# Patient Record
Sex: Female | Born: 1960 | Race: Black or African American | Hispanic: No | Smoking: Former smoker
Health system: Southern US, Community
[De-identification: ages and names within clinical notes are randomized; demographics above are authoritative.]

## PROBLEM LIST (undated history)

## (undated) DIAGNOSIS — E039 Hypothyroidism, unspecified: Secondary | ICD-10-CM

## (undated) DIAGNOSIS — E785 Hyperlipidemia, unspecified: Secondary | ICD-10-CM

## (undated) DIAGNOSIS — I10 Essential (primary) hypertension: Secondary | ICD-10-CM

## (undated) DIAGNOSIS — F17200 Nicotine dependence, unspecified, uncomplicated: Secondary | ICD-10-CM

## (undated) DIAGNOSIS — Z87442 Personal history of urinary calculi: Secondary | ICD-10-CM

## (undated) DIAGNOSIS — M1990 Unspecified osteoarthritis, unspecified site: Secondary | ICD-10-CM

## (undated) DIAGNOSIS — E119 Type 2 diabetes mellitus without complications: Secondary | ICD-10-CM

## (undated) HISTORY — DX: Type 2 diabetes mellitus without complications: E11.9

## (undated) HISTORY — PX: OTHER SURGICAL HISTORY: SHX169

## (undated) HISTORY — DX: Nicotine dependence, unspecified, uncomplicated: F17.200

## (undated) HISTORY — DX: Hyperlipidemia, unspecified: E78.5

## (undated) HISTORY — DX: Essential (primary) hypertension: I10

## (undated) HISTORY — PX: TRIGGER FINGER RELEASE: SHX641

## (undated) HISTORY — DX: Hypothyroidism, unspecified: E03.9

---
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
## 1999-03-02 ENCOUNTER — Encounter: Payer: Self-pay | Admitting: Internal Medicine

## 1999-03-02 ENCOUNTER — Encounter: Admission: RE | Admit: 1999-03-02 | Discharge: 1999-03-02 | Payer: Self-pay | Admitting: Internal Medicine

## 1999-03-14 ENCOUNTER — Encounter: Admission: RE | Admit: 1999-03-14 | Discharge: 1999-03-14 | Payer: Self-pay | Admitting: Urology

## 1999-10-12 ENCOUNTER — Encounter: Admission: RE | Admit: 1999-10-12 | Discharge: 1999-10-12 | Payer: Self-pay | Admitting: Cardiology

## 1999-10-12 ENCOUNTER — Encounter: Payer: Self-pay | Admitting: Cardiology

## 1999-11-02 ENCOUNTER — Other Ambulatory Visit: Admission: RE | Admit: 1999-11-02 | Discharge: 1999-11-02 | Payer: Self-pay | Admitting: Internal Medicine

## 2000-07-04 ENCOUNTER — Encounter: Admission: RE | Admit: 2000-07-04 | Discharge: 2000-07-04 | Payer: Self-pay | Admitting: Internal Medicine

## 2000-07-04 ENCOUNTER — Encounter: Payer: Self-pay | Admitting: Internal Medicine

## 2003-11-24 ENCOUNTER — Encounter: Admission: RE | Admit: 2003-11-24 | Discharge: 2003-12-07 | Payer: Self-pay | Admitting: Internal Medicine

## 2004-02-24 ENCOUNTER — Ambulatory Visit: Payer: Self-pay | Admitting: Internal Medicine

## 2004-06-22 ENCOUNTER — Ambulatory Visit: Payer: Self-pay | Admitting: Internal Medicine

## 2004-06-29 ENCOUNTER — Other Ambulatory Visit: Admission: RE | Admit: 2004-06-29 | Discharge: 2004-06-29 | Payer: Self-pay | Admitting: Internal Medicine

## 2004-06-29 ENCOUNTER — Ambulatory Visit: Payer: Self-pay | Admitting: Internal Medicine

## 2004-10-24 ENCOUNTER — Ambulatory Visit: Payer: Self-pay | Admitting: Internal Medicine

## 2004-10-27 ENCOUNTER — Ambulatory Visit: Payer: Self-pay | Admitting: Internal Medicine

## 2004-10-31 ENCOUNTER — Ambulatory Visit: Payer: Self-pay | Admitting: Internal Medicine

## 2005-01-30 ENCOUNTER — Ambulatory Visit: Payer: Self-pay | Admitting: Internal Medicine

## 2005-04-26 ENCOUNTER — Ambulatory Visit: Payer: Self-pay | Admitting: Internal Medicine

## 2005-05-03 ENCOUNTER — Ambulatory Visit: Payer: Self-pay | Admitting: Internal Medicine

## 2005-08-07 ENCOUNTER — Encounter: Admission: RE | Admit: 2005-08-07 | Discharge: 2005-08-07 | Payer: Self-pay | Admitting: Internal Medicine

## 2005-08-23 ENCOUNTER — Encounter: Payer: Self-pay | Admitting: Internal Medicine

## 2005-10-24 ENCOUNTER — Ambulatory Visit: Payer: Self-pay | Admitting: Internal Medicine

## 2005-11-06 ENCOUNTER — Encounter (INDEPENDENT_AMBULATORY_CARE_PROVIDER_SITE_OTHER): Payer: Self-pay | Admitting: *Deleted

## 2005-11-06 ENCOUNTER — Other Ambulatory Visit: Admission: RE | Admit: 2005-11-06 | Discharge: 2005-11-06 | Payer: Self-pay | Admitting: Internal Medicine

## 2005-11-06 ENCOUNTER — Ambulatory Visit: Payer: Self-pay | Admitting: Internal Medicine

## 2006-01-01 ENCOUNTER — Ambulatory Visit: Payer: Self-pay | Admitting: Internal Medicine

## 2006-01-08 ENCOUNTER — Ambulatory Visit: Payer: Self-pay | Admitting: Internal Medicine

## 2006-07-02 ENCOUNTER — Ambulatory Visit: Payer: Self-pay | Admitting: Internal Medicine

## 2006-07-02 LAB — CONVERTED CEMR LAB
ALT: 15 units/L (ref 0–40)
AST: 15 units/L (ref 0–37)
Albumin: 3.2 g/dL — ABNORMAL LOW (ref 3.5–5.2)
Alkaline Phosphatase: 71 units/L (ref 39–117)
BUN: 9 mg/dL (ref 6–23)
Bilirubin, Direct: 0.1 mg/dL (ref 0.0–0.3)
CO2: 29 meq/L (ref 19–32)
Calcium: 8.9 mg/dL (ref 8.4–10.5)
Chloride: 107 meq/L (ref 96–112)
Cholesterol: 170 mg/dL (ref 0–200)
Creatinine, Ser: 0.6 mg/dL (ref 0.4–1.2)
GFR calc Af Amer: 138 mL/min
GFR calc non Af Amer: 114 mL/min
Glucose, Bld: 97 mg/dL (ref 70–99)
HDL: 54 mg/dL (ref >39.0)
Hgb A1c MFr Bld: 6.5 % — ABNORMAL HIGH (ref 4.6–6.0)
LDL Cholesterol: 101 mg/dL — ABNORMAL HIGH (ref 0–99)
Potassium: 4.2 meq/L (ref 3.5–5.1)
Sodium: 142 meq/L (ref 135–145)
Total Bilirubin: 0.5 mg/dL (ref 0.3–1.2)
Total CHOL/HDL Ratio: 3.1
Total Protein: 7.3 g/dL (ref 6.0–8.3)
Triglycerides: 73 mg/dL (ref 0–149)
VLDL: 15 mg/dL (ref 0–40)

## 2006-07-09 ENCOUNTER — Ambulatory Visit: Payer: Self-pay | Admitting: Internal Medicine

## 2006-08-13 ENCOUNTER — Encounter: Admission: RE | Admit: 2006-08-13 | Discharge: 2006-08-13 | Payer: Self-pay | Admitting: Internal Medicine

## 2006-11-08 ENCOUNTER — Ambulatory Visit: Payer: Self-pay | Admitting: Internal Medicine

## 2006-11-08 LAB — CONVERTED CEMR LAB
ALT: 15 units/L (ref 0–35)
AST: 15 units/L (ref 0–37)
Albumin: 3.5 g/dL (ref 3.5–5.2)
Alkaline Phosphatase: 79 units/L (ref 39–117)
BUN: 8 mg/dL (ref 6–23)
Basophils Absolute: 0.1 10*3/uL (ref 0.0–0.1)
Basophils Relative: 0.7 % (ref 0.0–1.0)
Bilirubin, Direct: 0.1 mg/dL (ref 0.0–0.3)
CO2: 28 meq/L (ref 19–32)
Calcium: 9.2 mg/dL (ref 8.4–10.5)
Chloride: 106 meq/L (ref 96–112)
Cholesterol: 180 mg/dL (ref 0–200)
Creatinine, Ser: 0.6 mg/dL (ref 0.4–1.2)
Creatinine,U: 200.6 mg/dL
Eosinophils Absolute: 0.2 10*3/uL (ref 0.0–0.6)
Eosinophils Relative: 2.2 % (ref 0.0–5.0)
GFR calc Af Amer: 137 mL/min
GFR calc non Af Amer: 113 mL/min
Glucose, Bld: 108 mg/dL — ABNORMAL HIGH (ref 70–99)
HCT: 39.7 % (ref 36.0–46.0)
HDL: 59.7 mg/dL (ref >39.0)
Hemoglobin: 13.9 g/dL (ref 12.0–15.0)
Hgb A1c MFr Bld: 6.8 % — ABNORMAL HIGH (ref 4.6–6.0)
LDL Cholesterol: 106 mg/dL — ABNORMAL HIGH (ref 0–99)
Lymphocytes Relative: 27 % (ref 12.0–46.0)
MCHC: 35.1 g/dL (ref 30.0–36.0)
MCV: 87.3 fL (ref 78.0–100.0)
Microalb Creat Ratio: 5 mg/g (ref 0.0–30.0)
Microalb, Ur: 1 mg/dL (ref 0.0–1.9)
Monocytes Absolute: 0.6 10*3/uL (ref 0.2–0.7)
Monocytes Relative: 7.2 % (ref 3.0–11.0)
Neutro Abs: 5.7 10*3/uL (ref 1.4–7.7)
Neutrophils Relative %: 62.9 % (ref 43.0–77.0)
Platelets: 239 10*3/uL (ref 150–400)
Potassium: 4 meq/L (ref 3.5–5.1)
RBC: 4.55 M/uL (ref 3.87–5.11)
RDW: 12.5 % (ref 11.5–14.6)
Sodium: 139 meq/L (ref 135–145)
TSH: 0.72 microintl units/mL (ref 0.35–5.50)
Total Bilirubin: 0.8 mg/dL (ref 0.3–1.2)
Total CHOL/HDL Ratio: 3
Total Protein: 7.6 g/dL (ref 6.0–8.3)
Triglycerides: 72 mg/dL (ref 0–149)
VLDL: 14 mg/dL (ref 0–40)
WBC: 9 10*3/uL (ref 4.5–10.5)

## 2006-11-13 DIAGNOSIS — E039 Hypothyroidism, unspecified: Secondary | ICD-10-CM | POA: Insufficient documentation

## 2006-11-13 DIAGNOSIS — E119 Type 2 diabetes mellitus without complications: Secondary | ICD-10-CM

## 2006-11-13 DIAGNOSIS — E1165 Type 2 diabetes mellitus with hyperglycemia: Secondary | ICD-10-CM | POA: Insufficient documentation

## 2006-11-13 DIAGNOSIS — E785 Hyperlipidemia, unspecified: Secondary | ICD-10-CM

## 2006-11-13 HISTORY — DX: Hypothyroidism, unspecified: E03.9

## 2006-11-13 HISTORY — DX: Type 2 diabetes mellitus without complications: E11.9

## 2006-11-13 HISTORY — DX: Hyperlipidemia, unspecified: E78.5

## 2006-12-19 ENCOUNTER — Ambulatory Visit: Payer: Self-pay | Admitting: Internal Medicine

## 2007-05-09 ENCOUNTER — Ambulatory Visit: Payer: Self-pay | Admitting: Internal Medicine

## 2007-05-09 LAB — CONVERTED CEMR LAB
ALT: 20 units/L (ref 0–35)
AST: 15 units/L (ref 0–37)
Albumin: 3.4 g/dL — ABNORMAL LOW (ref 3.5–5.2)
Alkaline Phosphatase: 89 units/L (ref 39–117)
BUN: 9 mg/dL (ref 6–23)
Calcium: 9.2 mg/dL (ref 8.4–10.5)
Chloride: 104 meq/L (ref 96–112)
Creatinine, Ser: 0.7 mg/dL (ref 0.4–1.2)
Creatinine,U: 169.2 mg/dL
GFR calc non Af Amer: 95 mL/min
Hgb A1c MFr Bld: 7.5 % — ABNORMAL HIGH (ref 4.6–6.0)
LDL Cholesterol: 107 mg/dL — ABNORMAL HIGH (ref 0–99)
Sodium: 139 meq/L (ref 135–145)
VLDL: 15 mg/dL (ref 0–40)

## 2007-05-16 ENCOUNTER — Ambulatory Visit: Payer: Self-pay | Admitting: Internal Medicine

## 2007-05-16 LAB — HM DIABETES FOOT EXAM

## 2007-05-19 DIAGNOSIS — I10 Essential (primary) hypertension: Secondary | ICD-10-CM | POA: Insufficient documentation

## 2007-05-19 HISTORY — DX: Essential (primary) hypertension: I10

## 2007-08-08 ENCOUNTER — Ambulatory Visit: Payer: Self-pay | Admitting: Internal Medicine

## 2007-08-08 LAB — CONVERTED CEMR LAB
AST: 15 units/L (ref 0–37)
Albumin: 3.5 g/dL (ref 3.5–5.2)
Alkaline Phosphatase: 75 units/L (ref 39–117)
BUN: 7 mg/dL (ref 6–23)
Cholesterol: 131 mg/dL (ref 0–200)
Creatinine, Ser: 0.6 mg/dL (ref 0.4–1.2)
GFR calc Af Amer: 137 mL/min
Glucose, Bld: 93 mg/dL (ref 70–99)
Potassium: 3.9 meq/L (ref 3.5–5.1)
Total Protein: 7.3 g/dL (ref 6.0–8.3)
Triglycerides: 43 mg/dL (ref 0–149)
VLDL: 9 mg/dL (ref 0–40)

## 2007-08-14 ENCOUNTER — Encounter: Admission: RE | Admit: 2007-08-14 | Discharge: 2007-08-14 | Payer: Self-pay | Admitting: Cardiology

## 2007-08-15 ENCOUNTER — Ambulatory Visit: Payer: Self-pay | Admitting: Internal Medicine

## 2007-12-05 ENCOUNTER — Ambulatory Visit: Payer: Self-pay | Admitting: Internal Medicine

## 2007-12-05 LAB — CONVERTED CEMR LAB
ALT: 14 units/L (ref 0–35)
Albumin: 3.6 g/dL (ref 3.5–5.2)
Alkaline Phosphatase: 71 units/L (ref 39–117)
Bilirubin, Direct: 0.1 mg/dL (ref 0.0–0.3)
Chloride: 109 meq/L (ref 96–112)
Cholesterol: 138 mg/dL (ref 0–200)
GFR calc non Af Amer: 95 mL/min
Hgb A1c MFr Bld: 6.3 % — ABNORMAL HIGH (ref 4.6–6.0)
LDL Cholesterol: 77 mg/dL (ref 0–99)
Potassium: 4 meq/L (ref 3.5–5.1)
Total Bilirubin: 0.8 mg/dL (ref 0.3–1.2)
Total CHOL/HDL Ratio: 2.5
Total Protein: 7 g/dL (ref 6.0–8.3)

## 2007-12-16 ENCOUNTER — Ambulatory Visit: Payer: Self-pay | Admitting: Internal Medicine

## 2007-12-16 DIAGNOSIS — F17200 Nicotine dependence, unspecified, uncomplicated: Secondary | ICD-10-CM

## 2007-12-16 HISTORY — DX: Nicotine dependence, unspecified, uncomplicated: F17.200

## 2008-04-02 ENCOUNTER — Ambulatory Visit: Payer: Self-pay | Admitting: Internal Medicine

## 2008-04-02 LAB — CONVERTED CEMR LAB
ALT: 15 units/L (ref 0–35)
AST: 16 units/L (ref 0–37)
Albumin: 3.2 g/dL — ABNORMAL LOW (ref 3.5–5.2)
Alkaline Phosphatase: 72 units/L (ref 39–117)
BUN: 12 mg/dL (ref 6–23)
CO2: 29 meq/L (ref 19–32)
Chloride: 108 meq/L (ref 96–112)
Cholesterol: 185 mg/dL (ref 0–200)
Creatinine, Ser: 0.6 mg/dL (ref 0.4–1.2)
GFR calc non Af Amer: 113 mL/min
Hgb A1c MFr Bld: 6.1 % — ABNORMAL HIGH (ref 4.6–6.0)
LDL Cholesterol: 120 mg/dL — ABNORMAL HIGH (ref 0–99)
Potassium: 4.7 meq/L (ref 3.5–5.1)
Triglycerides: 59 mg/dL (ref 0–149)

## 2008-04-08 ENCOUNTER — Ambulatory Visit: Payer: Self-pay | Admitting: Internal Medicine

## 2008-05-12 ENCOUNTER — Telehealth: Payer: Self-pay | Admitting: Internal Medicine

## 2008-08-14 ENCOUNTER — Encounter: Admission: RE | Admit: 2008-08-14 | Discharge: 2008-08-14 | Payer: Self-pay | Admitting: Internal Medicine

## 2008-10-08 ENCOUNTER — Ambulatory Visit: Payer: Self-pay | Admitting: Internal Medicine

## 2008-10-08 LAB — CONVERTED CEMR LAB
ALT: 15 units/L (ref 0–35)
AST: 15 units/L (ref 0–37)
Alkaline Phosphatase: 62 units/L (ref 39–117)
Basophils Relative: 0.2 % (ref 0.0–3.0)
Bilirubin, Direct: 0 mg/dL (ref 0.0–0.3)
Calcium: 8.6 mg/dL (ref 8.4–10.5)
Creatinine, Ser: 0.6 mg/dL (ref 0.4–1.2)
Eosinophils Relative: 1.3 % (ref 0.0–5.0)
HDL: 63.8 mg/dL (ref >39.00)
Hemoglobin: 14.3 g/dL (ref 12.0–15.0)
Ketones, urine, test strip: NEGATIVE
LDL Cholesterol: 100 mg/dL — ABNORMAL HIGH (ref 0–99)
Lymphocytes Relative: 22.3 % (ref 12.0–46.0)
Microalb, Ur: 1.5 mg/dL (ref 0.0–1.9)
Monocytes Relative: 7.5 % (ref 3.0–12.0)
Neutro Abs: 6.2 10*3/uL (ref 1.4–7.7)
Neutrophils Relative %: 68.7 % (ref 43.0–77.0)
Nitrite: NEGATIVE
RBC: 4.45 M/uL (ref 3.87–5.11)
Sodium: 139 meq/L (ref 135–145)
Total CHOL/HDL Ratio: 3
Total Protein: 7.3 g/dL (ref 6.0–8.3)
Triglycerides: 60 mg/dL (ref 0.0–149.0)
Urobilinogen, UA: 0.2
WBC: 9 10*3/uL (ref 4.5–10.5)

## 2008-10-15 ENCOUNTER — Encounter: Payer: Self-pay | Admitting: Internal Medicine

## 2008-10-15 ENCOUNTER — Other Ambulatory Visit: Admission: RE | Admit: 2008-10-15 | Discharge: 2008-10-15 | Payer: Self-pay | Admitting: Internal Medicine

## 2008-10-15 ENCOUNTER — Ambulatory Visit: Payer: Self-pay | Admitting: Internal Medicine

## 2008-10-28 ENCOUNTER — Ambulatory Visit: Payer: Self-pay | Admitting: Internal Medicine

## 2008-11-11 ENCOUNTER — Ambulatory Visit: Payer: Self-pay | Admitting: Internal Medicine

## 2008-11-11 ENCOUNTER — Encounter: Payer: Self-pay | Admitting: Internal Medicine

## 2008-11-11 LAB — HM COLONOSCOPY

## 2008-11-12 ENCOUNTER — Encounter: Payer: Self-pay | Admitting: Internal Medicine

## 2009-03-25 ENCOUNTER — Ambulatory Visit: Payer: Self-pay | Admitting: Internal Medicine

## 2009-03-25 LAB — CONVERTED CEMR LAB
Albumin: 3.5 g/dL (ref 3.5–5.2)
Alkaline Phosphatase: 69 units/L (ref 39–117)
Calcium: 8.9 mg/dL (ref 8.4–10.5)
Chloride: 108 meq/L (ref 96–112)
Cholesterol: 198 mg/dL (ref 0–200)
Creatinine, Ser: 0.7 mg/dL (ref 0.4–1.2)
HDL: 61.1 mg/dL (ref >39.00)
Hgb A1c MFr Bld: 6.1 % (ref 4.6–6.5)
LDL Cholesterol: 127 mg/dL — ABNORMAL HIGH (ref 0–99)
Sodium: 141 meq/L (ref 135–145)
Total Protein: 7.2 g/dL (ref 6.0–8.3)
Triglycerides: 48 mg/dL (ref 0.0–149.0)

## 2009-04-01 ENCOUNTER — Ambulatory Visit: Payer: Self-pay | Admitting: Internal Medicine

## 2009-07-20 ENCOUNTER — Ambulatory Visit: Payer: Self-pay | Admitting: Family Medicine

## 2009-07-29 ENCOUNTER — Encounter: Payer: Self-pay | Admitting: Internal Medicine

## 2009-08-16 ENCOUNTER — Encounter: Admission: RE | Admit: 2009-08-16 | Discharge: 2009-08-16 | Payer: Self-pay | Admitting: Internal Medicine

## 2009-09-30 ENCOUNTER — Ambulatory Visit: Payer: Self-pay | Admitting: Internal Medicine

## 2009-09-30 LAB — CONVERTED CEMR LAB
ALT: 17 units/L (ref 0–35)
BUN: 15 mg/dL (ref 6–23)
CO2: 30 meq/L (ref 19–32)
Chloride: 105 meq/L (ref 96–112)
Cholesterol: 219 mg/dL — ABNORMAL HIGH (ref 0–200)
Creatinine, Ser: 0.6 mg/dL (ref 0.4–1.2)
Direct LDL: 139.1 mg/dL
Hgb A1c MFr Bld: 6.3 % (ref 4.6–6.5)
Total Protein: 6.9 g/dL (ref 6.0–8.3)
VLDL: 20.6 mg/dL (ref 0.0–40.0)

## 2009-10-07 ENCOUNTER — Ambulatory Visit: Payer: Self-pay | Admitting: Internal Medicine

## 2009-11-11 HISTORY — PX: COLONOSCOPY: SHX174

## 2009-12-22 ENCOUNTER — Ambulatory Visit: Payer: Self-pay | Admitting: Internal Medicine

## 2010-01-22 LAB — HM DIABETES EYE EXAM: HM Diabetic Eye Exam: NORMAL

## 2010-01-27 ENCOUNTER — Ambulatory Visit: Payer: Self-pay | Admitting: Internal Medicine

## 2010-01-27 LAB — CONVERTED CEMR LAB
AST: 11 units/L (ref 0–37)
Albumin: 3.6 g/dL (ref 3.5–5.2)
Alkaline Phosphatase: 79 units/L (ref 39–117)
CO2: 28 meq/L (ref 19–32)
Glucose, Bld: 106 mg/dL — ABNORMAL HIGH (ref 70–99)
Potassium: 4.2 meq/L (ref 3.5–5.1)
Sodium: 135 meq/L (ref 135–145)
Total Protein: 7.2 g/dL (ref 6.0–8.3)

## 2010-02-03 ENCOUNTER — Ambulatory Visit: Payer: Self-pay | Admitting: Internal Medicine

## 2010-04-24 LAB — HM PAP SMEAR: HM Pap smear: NORMAL

## 2010-05-15 ENCOUNTER — Encounter: Payer: Self-pay | Admitting: Internal Medicine

## 2010-05-26 NOTE — Assessment & Plan Note (Signed)
Summary: COUGH, RUNNY NOSE // RS   Vital Signs:  Patient profile:   50 year old female Weight:      190 pounds Temp:     99.7 degrees F oral Pulse rate:   88 / minute Pulse rhythm:   regular Resp:     16 per minute BP sitting:   112 / 60  (left arm) Cuff size:   regular  Vitals Entered By: Gladis Riffle, RN (December 22, 2009 10:29 AM) CC: c/o nighttime cough, runny nose and slight congestion x 2 1/2  weeks, URI symptoms Is Patient Diabetic? Yes Did you bring your meter with you today? No   CC:  c/o nighttime cough, runny nose and slight congestion x 2 1/2  weeks, and URI symptoms.  History of Present Illness:  URI Symptoms      This is a 50 year old woman who presents with URI symptoms.  The patient denies nasal congestion, clear nasal discharge, and dry cough.  The patient denies fever.  The patient denies itchy watery eyes.  The patient denies the following risk factors for Strep sinusitis: unilateral facial pain and unilateral nasal discharge.  sxs for 3 weeks  All other systems reviewed and were negative   Preventive Screening-Counseling & Management  Alcohol-Tobacco     Smoking Status: current     Smoking Cessation Counseling: yes     Packs/Day: 0.75  Current Problems (verified): 1)  Tobacco Use  (ICD-305.1) 2)  Hypertension  (ICD-401.9) 3)  Well Adult Exam  (ICD-V70.0) 4)  Hypothyroidism  (ICD-244.9) 5)  Hyperlipidemia  (ICD-272.4) 6)  Diabetes Mellitus, Type II  (ICD-250.00)  Current Medications (verified): 1)  Synthroid 75 Mcg Tabs (Levothyroxine Sodium) .... Take 1 Tablet By Mouth Once A Day 2)  Lisinopril 5 Mg Tabs (Lisinopril) .... Take 1 Tablet By Mouth Once A Day  Allergies (verified): No Known Drug Allergies  Past History:  Past Medical History: Last updated: 05/16/2007 Diabetes mellitus, type II Hyperlipidemia Hypothyroidism Hypertension  Past Surgical History: Last updated: 11/13/2006 Caesarean section  Family History: Last updated:  05/16/2007 Family History Breast cancer 1st degree relative <50  sister Family History Diabetes 1st degree relative-mother and two brothers  Social History: Last updated: 12/19/2006 Occupation: Statistician Married Current Smoker Regular exercise-no  Risk Factors: Exercise: no (12/19/2006)  Risk Factors: Smoking Status: current (12/22/2009) Packs/Day: 0.75 (12/22/2009)  Physical Exam  General:  alert and well-developed.   Head:  normocephalic and atraumatic.   Eyes:  pupils equal and pupils round.   Neck:  No deformities, masses, or tenderness noted. Chest Wall:  no deformities and no tenderness.   Lungs:  normal respiratory effort and no intercostal retractions.   Heart:  normal rate and regular rhythm.   Abdomen:  Bowel sounds positive,abdomen soft and non-tender without masses, organomegaly or hernias noted.  overweight Msk:  No deformity or scoliosis noted of thoracic or lumbar spine.     Impression & Recommendations:  Problem # 1:  URI (ICD-465.9)  no evidence of bacterial infection. call for any concerns, increased sxs, fever, persistence of sxs, wheeze, SOB.  suspect allergic trial short course prednisone  Problem # 2:  TOBACCO USE (ICD-305.1)  Encouraged smoking cessation and discussed different methods for smoking cessation.   Complete Medication List: 1)  Synthroid 75 Mcg Tabs (Levothyroxine sodium) .... Take 1 tablet by mouth once a day 2)  Lisinopril 5 Mg Tabs (Lisinopril) .... Take 1 tablet by mouth once a day 3)  Prednisone 20 Mg  Tabs (Prednisone) .... Take 1 tablet by mouth once a day for 5 days Prescriptions: PREDNISONE 20 MG TABS (PREDNISONE) Take 1 tablet by mouth once a day for 5 days  #5 x 0   Entered and Authorized by:   Birdie Sons MD   Signed by:   Birdie Sons MD on 12/22/2009   Method used:   Electronically to        [facility] [address] (retail)       [phone]       [place], [place]  [address]        Ph: [phone]       Fax: [phone]   RxID:   [id number]

## 2010-05-26 NOTE — Consult Note (Signed)
Summary: [facility]  [facility]   Imported By: Maryln Gottron 08/11/2009 14:30:29  _____________________________________________________________________  External Attachment:    Type:   Image     Comment:   External Document

## 2010-05-26 NOTE — Assessment & Plan Note (Signed)
Summary: 4 MONTH FUP//CCM--Rm 15   Vital Signs:  Patient profile:   50 year old female Height:      63.5 inches Weight:      189 pounds BMI:     33.07 Temp:     98.9 degrees F oral Pulse rate:   72 / minute Pulse rhythm:   regular Resp:     18 per minute BP sitting:   138 / 70  (left arm) Cuff size:   regular  Vitals Entered By: Mervin Kung CMA Duncan Dull) (February 03, 2010 8:18 AM) CC: Rm 15  4 month f/u. Is Patient Diabetic? No Pain Assessment Patient in pain? no      Comments Pt agrees all med doses and directions are correct. Nicki Guadalajara Fergerson CMA (AAMA)  February 03, 2010 8:23 AM    CC:  Rm 15  4 month f/u.Marland Kitchen  History of Present Illness:  Follow-Up Visit      This is a 50 year old woman who presents for Follow-up visit.  The patient denies chest pain and palpitations.  Since the last visit the patient notes no new problems or concerns.  The patient reports taking meds as prescribed.  When questioned about possible medication side effects, the patient notes   All other systems reviewed and were negative   Current Problems (verified): 1)  Hypertension  (ICD-401.9) 2)  Tobacco Use  (ICD-305.1) 3)  Hypertension  (ICD-401.9) 4)  Well Adult Exam  (ICD-V70.0) 5)  Hypothyroidism  (ICD-244.9) 6)  Hyperlipidemia  (ICD-272.4) 7)  Diabetes Mellitus, Type II  (ICD-250.00)  Current Medications (verified): 1)  Synthroid 75 Mcg Tabs (Levothyroxine Sodium) .... Take 1 Tablet By Mouth Once A Day 2)  Lisinopril 5 Mg Tabs (Lisinopril) .... Take 1 Tablet By Mouth Once A Day  Allergies (verified): No Known Drug Allergies  Past History:  Past Medical History: Last updated: 05/16/2007 Diabetes mellitus, type II Hyperlipidemia Hypothyroidism Hypertension  Past Surgical History: Last updated: 11/13/2006 Caesarean section  Family History: Last updated: 05/16/2007 Family History Breast cancer 1st degree relative <50  sister Family History Diabetes 1st degree relative-mother  and two brothers  Social History: Last updated: 12/19/2006 Occupation: Statistician Married Current Smoker Regular exercise-no  Risk Factors: Exercise: no (12/19/2006)  Risk Factors: Smoking Status: current (12/22/2009) Packs/Day: 0.75 (12/22/2009)  Physical Exam  General:  overweight female in no acute distress. HEENT exam atraumatic, normocephalic symmetrical muscles are intact. Neck is supple without lymphadenopathy or thyromegaly. Chest clear to auscultation without increased work of breathing. Abdominal exam overweight, tumescence, soft. Extremities 1+ edema ankles. Neurologic exam she appears alert Heart:  normal rate and regular rhythm.  1-2/6 sem  Diabetes Management Exam:    Eye Exam:       Eye Exam done elsewhere          Date: 01/22/2010          Results: normal-pts report          Done by: ophth   Impression & Recommendations:  Problem # 1:  HYPERTENSION (ICD-401.9) controlled continue current medications  Her updated medication list for this problem includes:    Lisinopril 5 Mg Tabs (Lisinopril) .Marland Kitchen... Take 1 tablet by mouth once a day  BP today: 138/70 Prior BP: 112/60 (12/22/2009)  Labs Reviewed: K+: 4.2 (01/27/2010) Creat: : 0.7 (01/27/2010)   Chol: 196 (01/27/2010)   HDL: 58.60 (01/27/2010)   LDL: 124 (01/27/2010)   TG: 67.0 (01/27/2010)  Problem # 2:  TOBACCO USE (ICD-305.1)  Encouraged smoking  cessation and discussed different methods for smoking cessation.   Problem # 3:  DIABETES MELLITUS, TYPE II (ICD-250.00) no meds and A1c ok advised aggressive weight loss reviewed records ...note 20 poound eight gain in past 10 months Her updated medication list for this problem includes:    Lisinopril 5 Mg Tabs (Lisinopril) .Marland Kitchen... Take 1 tablet by mouth once a day  Labs Reviewed: Creat: 0.7 (01/27/2010)     Last Eye Exam: normal-pt's report (03/24/2009) Reviewed HgBA1c results: 6.9 (01/27/2010)  6.3 (09/30/2009)  Complete Medication List: 1)  Synthroid  75 Mcg Tabs (Levothyroxine sodium) .... Take 1 tablet by mouth once a day 2)  Lisinopril 5 Mg Tabs (Lisinopril) .... Take 1 tablet by mouth once a day  Patient Instructions: 1)  Please schedule a follow-up appointment in 4 months. 2)  labs one week prior to visit 3)  lipids---272.4 4)  lfts-995.2 5)  bmet-995.2 6)  A1C-250.02 7)      Current Allergies (reviewed today): No known allergies     Contraindications/Deferment of Procedures/Staging:    Test/Procedure: FLU VAX    Reason for deferment: patient declined     Test/Procedure: Pneumovax vaccine    Reason for deferment: patient declined

## 2010-05-26 NOTE — Assessment & Plan Note (Signed)
Summary: 6 month rov/njr   Vital Signs:  Patient profile:   50 year old female Weight:      183 pounds BMI:     32.02 Temp:     99 degrees F oral Pulse rate:   72 / minute Pulse rhythm:   regular Resp:     12 per minute BP sitting:   100 / 60  (left arm) Cuff size:   regular  Vitals Entered By: Gladis Riffle, RN (October 07, 2009 8:02 AM) CC: 6 month rov, labs done Is Patient Diabetic? Yes Did you bring your meter with you today? No   CC:  6 month rov and labs done.  History of Present Illness:  Follow-Up Visit      This is a 50 year old woman who presents for Follow-up visit.  The patient denies chest pain and palpitations.  Since the last visit the patient notes no new problems or concerns.  The patient reports taking meds as prescribed.  When questioned about possible medication side effects, the patient notes none.    All other systems reviewed and were negative   Preventive Screening-Counseling & Management  Alcohol-Tobacco     Smoking Status: current     Smoking Cessation Counseling: yes     Packs/Day: 0.75  Current Problems (verified): 1)  Tobacco Use  (ICD-305.1) 2)  Hypertension  (ICD-401.9) 3)  Well Adult Exam  (ICD-V70.0) 4)  Hypothyroidism  (ICD-244.9) 5)  Hyperlipidemia  (ICD-272.4) 6)  Diabetes Mellitus, Type II  (ICD-250.00)  Current Medications (verified): 1)  Synthroid 75 Mcg Tabs (Levothyroxine Sodium) .... Take 1 Tablet By Mouth Once A Day 2)  Lisinopril 5 Mg Tabs (Lisinopril) .... Take 1 Tablet By Mouth Once A Day  Allergies (verified): No Known Drug Allergies  Past History:  Past Medical History: Last updated: 05/16/2007 Diabetes mellitus, type II Hyperlipidemia Hypothyroidism Hypertension  Past Surgical History: Last updated: 11/13/2006 Caesarean section  Family History: Last updated: 05/16/2007 Family History Breast cancer 1st degree relative <50  sister Family History Diabetes 1st degree relative-mother and two brothers  Social  History: Last updated: 12/19/2006 Occupation: Statistician Married Current Smoker Regular exercise-no  Risk Factors: Exercise: no (12/19/2006)  Risk Factors: Smoking Status: current (10/07/2009) Packs/Day: 0.75 (10/07/2009)  Physical Exam  General:  alert and well-developed.   Head:  normocephalic and atraumatic.   Eyes:  pupils equal and pupils round.   Neck:  No deformities, masses, or tenderness noted. Chest Wall:  no deformities and no tenderness.   Lungs:  normal respiratory effort and no accessory muscle use.   Abdomen:  Bowel sounds positive,abdomen soft and non-tender without masses, organomegaly or hernias noted.  overweight Msk:  No deformity or scoliosis noted of thoracic or lumbar spine.   Neurologic:  cranial nerves II-XII intact and gait normal.   Psych:  normally interactive and good eye contact.     Impression & Recommendations:  Problem # 1:  DIABETES MELLITUS, TYPE II (ICD-250.00)  note weight gain A1C is still ok advised daily exercise low calorie diet Her updated medication list for this problem includes:    Lisinopril 5 Mg Tabs (Lisinopril) .Marland Kitchen... Take 1 tablet by mouth once a day  Labs Reviewed: Creat: 0.6 (09/30/2009)     Last Eye Exam: normal-pt's report (03/24/2009) Reviewed HgBA1c results: 6.3 (09/30/2009)  6.1 (03/25/2009)  Problem # 2:  HYPERTENSION (ICD-401.9) controlled continue current medications  Her updated medication list for this problem includes:    Lisinopril 5 Mg Tabs (Lisinopril) .Marland KitchenMarland KitchenMarland KitchenMarland Kitchen  Take 1 tablet by mouth once a day  BP today: 100/60 Prior BP: 126/60 (07/20/2009)  Labs Reviewed: K+: 4.3 (09/30/2009) Creat: : 0.6 (09/30/2009)   Chol: 219 (09/30/2009)   HDL: 62.10 (09/30/2009)   LDL: 127 (03/25/2009)   TG: 103.0 (09/30/2009)  Problem # 3:  HYPERLIPIDEMIA (ICD-272.4) note HDl and A1C control no statin at this time Labs Reviewed: SGOT: 16 (09/30/2009)   SGPT: 17 (09/30/2009)   HDL:62.10 (09/30/2009), 61.10 (03/25/2009)   LDL:127 (03/25/2009), 100 (10/08/2008)  Chol:219 (09/30/2009), 198 (03/25/2009)  Trig:103.0 (09/30/2009), 48.0 (03/25/2009)  Problem # 4:  TOBACCO USE (ICD-305.1)  Encouraged smoking cessation and discussed different methods for smoking cessation.  she is trying to taper  Complete Medication List: 1)  Synthroid 75 Mcg Tabs (Levothyroxine sodium) .... Take 1 tablet by mouth once a day 2)  Lisinopril 5 Mg Tabs (Lisinopril) .... Take 1 tablet by mouth once a day  Patient Instructions: 1)  Please schedule a follow-up appointment in 4 months. 2)  labs one week prior to visit 3)  lipids---272.4 4)  lfts-995.2 5)  bmet-995.2 6)  A1C-250.02 7)

## 2010-05-26 NOTE — Assessment & Plan Note (Signed)
Summary: ear flush/partial blockage of both ears/cjr   Vital Signs:  Patient profile:   50 year old female Weight:      179 pounds BMI:     31.32 Temp:     98.9 degrees F oral BP sitting:   126 / 60  (left arm) Cuff size:   regular  Vitals Entered By: Raechel Ache, RN (July 20, 2009 1:17 PM) CC: C/o ears clogged.   History of Present Illness: Here for hearing loss and pressure in both ears over the past week. No pain, no sinus symptoms, no dizziness.   Allergies: No Known Drug Allergies  Past History:  Past Medical History: Reviewed history from 05/16/2007 and no changes required. Diabetes mellitus, type II Hyperlipidemia Hypothyroidism Hypertension  Past Surgical History: Reviewed history from 11/13/2006 and no changes required. Caesarean section  Review of Systems  The patient denies anorexia, fever, weight loss, weight gain, vision loss, hoarseness, chest pain, syncope, dyspnea on exertion, peripheral edema, prolonged cough, headaches, hemoptysis, abdominal pain, melena, hematochezia, severe indigestion/heartburn, hematuria, incontinence, genital sores, muscle weakness, suspicious skin lesions, transient blindness, difficulty walking, depression, unusual weight change, abnormal bleeding, enlarged lymph nodes, angioedema, breast masses, and testicular masses.    Physical Exam  General:  Well-developed,well-nourished,in no acute distress; alert,appropriate and cooperative throughout examination Head:  Normocephalic and atraumatic without obvious abnormalities. No apparent alopecia or balding. Eyes:  No corneal or conjunctival inflammation noted. EOMI. Perrla. Funduscopic exam benign, without hemorrhages, exudates or papilledema. Vision grossly normal. Ears:  both canals are full of cerumen Nose:  External nasal examination shows no deformity or inflammation. Nasal mucosa are pink and moist without lesions or exudates. Mouth:  Oral mucosa and oropharynx without  lesions or exudates.  Teeth in good repair. Neck:  No deformities, masses, or tenderness noted.   Impression & Recommendations:  Problem # 1:  CERUMEN IMPACTION (ICD-380.4)  Orders: ENT Referral (ENT)  Complete Medication List: 1)  Synthroid 75 Mcg Tabs (Levothyroxine sodium) .... Take 1 tablet by mouth once a day 2)  Lisinopril 5 Mg Tabs (Lisinopril) .... Take 1 tablet by mouth once a day  Patient Instructions: 1)  We attempted several times to irrigate the ears but were unsuccessful. Will refer her to ENT.

## 2010-06-02 ENCOUNTER — Other Ambulatory Visit (INDEPENDENT_AMBULATORY_CARE_PROVIDER_SITE_OTHER): Payer: BC Managed Care – PPO | Admitting: Internal Medicine

## 2010-06-02 DIAGNOSIS — T887XXA Unspecified adverse effect of drug or medicament, initial encounter: Secondary | ICD-10-CM

## 2010-06-02 DIAGNOSIS — M3313 Other dermatomyositis without myopathy: Secondary | ICD-10-CM

## 2010-06-02 DIAGNOSIS — M3390 Dermatopolymyositis, unspecified, organ involvement unspecified: Secondary | ICD-10-CM

## 2010-06-02 DIAGNOSIS — E785 Hyperlipidemia, unspecified: Secondary | ICD-10-CM

## 2010-06-02 LAB — HEPATIC FUNCTION PANEL
AST: 18 U/L (ref 0–37)
Alkaline Phosphatase: 68 U/L (ref 39–117)
Total Bilirubin: 0.3 mg/dL (ref 0.3–1.2)

## 2010-06-02 LAB — BASIC METABOLIC PANEL
CO2: 28 mEq/L (ref 19–32)
Calcium: 9.1 mg/dL (ref 8.4–10.5)
Chloride: 101 mEq/L (ref 96–112)
Glucose, Bld: 96 mg/dL (ref 70–99)
Sodium: 136 mEq/L (ref 135–145)

## 2010-06-02 LAB — LIPID PANEL
HDL: 57.1 mg/dL (ref >39.00)
Total CHOL/HDL Ratio: 3

## 2010-06-09 ENCOUNTER — Encounter: Payer: Self-pay | Admitting: Internal Medicine

## 2010-06-09 ENCOUNTER — Ambulatory Visit (INDEPENDENT_AMBULATORY_CARE_PROVIDER_SITE_OTHER): Payer: BC Managed Care – PPO | Admitting: Internal Medicine

## 2010-06-09 DIAGNOSIS — E039 Hypothyroidism, unspecified: Secondary | ICD-10-CM

## 2010-06-09 DIAGNOSIS — E119 Type 2 diabetes mellitus without complications: Secondary | ICD-10-CM

## 2010-06-09 DIAGNOSIS — I10 Essential (primary) hypertension: Secondary | ICD-10-CM

## 2010-06-09 DIAGNOSIS — E785 Hyperlipidemia, unspecified: Secondary | ICD-10-CM

## 2010-06-09 DIAGNOSIS — F17200 Nicotine dependence, unspecified, uncomplicated: Secondary | ICD-10-CM

## 2010-06-09 DIAGNOSIS — E669 Obesity, unspecified: Secondary | ICD-10-CM

## 2010-06-09 NOTE — Progress Notes (Signed)
  Subjective:    Patient ID: Maria Hammond, female    DOB: 1960-08-16, 50 y.o.   MRN: [id number]  HPI  patient comes in for followup of multiple medical problems including type 2 diabetes, hyperlipidemia, hypertension. The patient does not check blood sugar or blood pressure at home. The patetient does not follow an exercise or diet program. The patient denies any polyuria, polydipsia.  In the past the patient has gone to diabetic treatment center. The patient is tolerating medications  Without difficulty. The patient does admit to medication compliance. No home CBGs and no current DM meds.  Also has hypothyroidism---tolerating replacement  Past Medical History  Diagnosis Date  . DIABETES MELLITUS, TYPE II 11/13/2006  . HYPERLIPIDEMIA 11/13/2006  . HYPERTENSION 05/19/2007  . HYPOTHYROIDISM 11/13/2006   Past Surgical History  Procedure Date  . Cesarean section   . Tonsillectomy 1970    reports that she quit smoking about 6 weeks ago. She does not have any smokeless tobacco history on file. She reports that she does not drink alcohol. Her drug history not on file. family history includes Breast cancer in her sister; Diabetes in her brothers and mother; Heart disease in her father; Kidney disease in her father; and Prostate cancer in her father.       Review of Systems  patient denies chest pain, shortness of breath, orthopnea. Denies lower extremity edema, abdominal pain, change in appetite, change in bowel movements. Patient denies rashes, musculoskeletal complaints. No other specific complaints in a complete review of systems.      Objective:   Physical Exam  Well-developed well-nourished female in no acute distress. HEENT exam atraumatic, normocephalic, extraocular muscles are intact. Neck is supple. No jugular venous distention no thyromegaly. Chest clear to auscultation without increased work of breathing. Cardiac exam S1 and S2 are regular. Abdominal exam active bowel sounds, soft,  nontender. Extremities no edema. Neurologic exam she is alert without any motor sensory deficits. Gait is normal.        Assessment & Plan:

## 2010-06-09 NOTE — Assessment & Plan Note (Signed)
Reasonable control. Continue current medications. 

## 2010-06-09 NOTE — Assessment & Plan Note (Signed)
Well-controlled. Continue current medications (currently no anti diabetic medications and proceed.

## 2010-06-09 NOTE — Assessment & Plan Note (Signed)
Has quit!---congratulated

## 2010-06-09 NOTE — Assessment & Plan Note (Signed)
Check labs next visit 

## 2010-07-18 ENCOUNTER — Other Ambulatory Visit: Payer: Self-pay | Admitting: Internal Medicine

## 2010-07-18 DIAGNOSIS — Z1231 Encounter for screening mammogram for malignant neoplasm of breast: Secondary | ICD-10-CM

## 2010-08-05 ENCOUNTER — Other Ambulatory Visit: Payer: Self-pay | Admitting: Internal Medicine

## 2010-08-18 ENCOUNTER — Ambulatory Visit
Admission: RE | Admit: 2010-08-18 | Discharge: 2010-08-18 | Disposition: A | Discharge status: Home / Self Care | Payer: BC Managed Care – PPO | Source: Ambulatory Visit | Attending: Internal Medicine | Admitting: Internal Medicine

## 2010-08-18 DIAGNOSIS — Z1231 Encounter for screening mammogram for malignant neoplasm of breast: Secondary | ICD-10-CM

## 2010-09-29 ENCOUNTER — Other Ambulatory Visit (INDEPENDENT_AMBULATORY_CARE_PROVIDER_SITE_OTHER): Payer: BC Managed Care – PPO

## 2010-09-29 ENCOUNTER — Other Ambulatory Visit: Payer: Self-pay | Admitting: Internal Medicine

## 2010-09-29 DIAGNOSIS — E119 Type 2 diabetes mellitus without complications: Secondary | ICD-10-CM

## 2010-09-29 DIAGNOSIS — I10 Essential (primary) hypertension: Secondary | ICD-10-CM

## 2010-09-29 DIAGNOSIS — E785 Hyperlipidemia, unspecified: Secondary | ICD-10-CM

## 2010-09-29 DIAGNOSIS — E039 Hypothyroidism, unspecified: Secondary | ICD-10-CM

## 2010-09-29 LAB — BASIC METABOLIC PANEL
BUN: 11 mg/dL (ref 6–23)
CO2: 25 mEq/L (ref 19–32)
GFR: 166.54 mL/min (ref >60.00)
Glucose, Bld: 118 mg/dL — ABNORMAL HIGH (ref 70–99)
Potassium: 4.5 mEq/L (ref 3.5–5.1)

## 2010-09-29 LAB — LIPID PANEL
Cholesterol: 216 mg/dL — ABNORMAL HIGH (ref 0–200)
HDL: 63.3 mg/dL (ref >39.00)
Triglycerides: 64 mg/dL (ref 0.0–149.0)
VLDL: 12.8 mg/dL (ref 0.0–40.0)

## 2010-09-29 LAB — HEPATIC FUNCTION PANEL
Albumin: 3.8 g/dL (ref 3.5–5.2)
Alkaline Phosphatase: 79 U/L (ref 39–117)

## 2010-09-29 LAB — TSH: TSH: 0.58 u[IU]/mL (ref 0.35–5.50)

## 2010-10-06 ENCOUNTER — Encounter: Payer: BC Managed Care – PPO | Admitting: Internal Medicine

## 2011-01-12 ENCOUNTER — Other Ambulatory Visit (HOSPITAL_COMMUNITY)
Admission: RE | Admit: 2011-01-12 | Discharge: 2011-01-12 | Disposition: A | Discharge status: Home / Self Care | Payer: BC Managed Care – PPO | Source: Ambulatory Visit | Attending: Internal Medicine | Admitting: Internal Medicine

## 2011-01-12 ENCOUNTER — Telehealth: Payer: Self-pay | Admitting: Internal Medicine

## 2011-01-12 ENCOUNTER — Ambulatory Visit (INDEPENDENT_AMBULATORY_CARE_PROVIDER_SITE_OTHER): Payer: BC Managed Care – PPO | Admitting: Internal Medicine

## 2011-01-12 ENCOUNTER — Encounter: Payer: Self-pay | Admitting: Internal Medicine

## 2011-01-12 VITALS — BP 108/66 | HR 72 | Temp 98.7°F | Ht 63.75 in | Wt 207.0 lb

## 2011-01-12 DIAGNOSIS — E039 Hypothyroidism, unspecified: Secondary | ICD-10-CM

## 2011-01-12 DIAGNOSIS — Z0000 Encounter for general adult medical examination without abnormal findings: Secondary | ICD-10-CM

## 2011-01-12 DIAGNOSIS — E119 Type 2 diabetes mellitus without complications: Secondary | ICD-10-CM

## 2011-01-12 DIAGNOSIS — M6530 Trigger finger, unspecified finger: Secondary | ICD-10-CM

## 2011-01-12 DIAGNOSIS — I10 Essential (primary) hypertension: Secondary | ICD-10-CM

## 2011-01-12 DIAGNOSIS — E785 Hyperlipidemia, unspecified: Secondary | ICD-10-CM

## 2011-01-12 DIAGNOSIS — Z01419 Encounter for gynecological examination (general) (routine) without abnormal findings: Secondary | ICD-10-CM | POA: Insufficient documentation

## 2011-01-12 DIAGNOSIS — D509 Iron deficiency anemia, unspecified: Secondary | ICD-10-CM

## 2011-01-12 LAB — BASIC METABOLIC PANEL
BUN: 10 mg/dL (ref 6–23)
Chloride: 106 mEq/L (ref 96–112)
Creatinine, Ser: 0.5 mg/dL (ref 0.4–1.2)
Glucose, Bld: 123 mg/dL — ABNORMAL HIGH (ref 70–99)

## 2011-01-12 LAB — HEPATIC FUNCTION PANEL
ALT: 19 U/L (ref 0–35)
AST: 14 U/L (ref 0–37)
Albumin: 3.8 g/dL (ref 3.5–5.2)
Total Bilirubin: 0.4 mg/dL (ref 0.3–1.2)
Total Protein: 7.6 g/dL (ref 6.0–8.3)

## 2011-01-12 LAB — HEMOGLOBIN A1C: Hgb A1c MFr Bld: 7.9 % — ABNORMAL HIGH (ref 4.6–6.5)

## 2011-01-12 LAB — LIPID PANEL
Cholesterol: 210 mg/dL — ABNORMAL HIGH (ref 0–200)
VLDL: 9.8 mg/dL (ref 0.0–40.0)

## 2011-01-12 NOTE — Telephone Encounter (Signed)
Yes Fasting labs, future orders placed for BMET, lipid, liver, Hgba1c.  Pt needs appt

## 2011-01-12 NOTE — Assessment & Plan Note (Addendum)
Needs repeat labs May need meds Start metformin. Side effects discussed.

## 2011-01-12 NOTE — Telephone Encounter (Signed)
Just saw Dr Cato Mulligan and had labs and  will be coming back in March 2013 for a follow up. Patient would like to know if labs are needed prior to her appt in March? If so, please order. Thanks.

## 2011-01-18 ENCOUNTER — Other Ambulatory Visit: Payer: Self-pay | Admitting: *Deleted

## 2011-01-18 MED ORDER — METFORMIN HCL 500 MG PO TABS: 500.0000 mg | ORAL_TABLET | Freq: Two times a day (BID) | ORAL | Status: DC

## 2011-01-18 MED ORDER — ATORVASTATIN CALCIUM 10 MG PO TABS: 10.0000 mg | ORAL_TABLET | Freq: Every day | ORAL | Status: DC

## 2011-01-22 NOTE — Progress Notes (Signed)
  Subjective:    Patient ID: Maria Hammond, female    DOB: Jan 13, 1961, 50 y.o.   MRN: [id number]  HPI  Patient is here for well visit exam. She feels well. No complaints.   Past Medical History  Diagnosis Date  . DIABETES MELLITUS, TYPE II 11/13/2006  . HYPERLIPIDEMIA 11/13/2006  . HYPERTENSION 05/19/2007  . HYPOTHYROIDISM 11/13/2006   Past Surgical History  Procedure Date  . Cesarean section   . Tonsillectomy 1970    reports that she quit smoking about 9 months ago. She does not have any smokeless tobacco history on file. She reports that she does not drink alcohol. Her drug history not on file. family history includes Breast cancer in her sister; Diabetes in her brothers and mother; Heart disease in her father; Kidney disease in her father; and Prostate cancer in her father. No Known Allergies   Review of Systems     patient denies chest pain, shortness of breath, orthopnea. Denies lower extremity edema, abdominal pain, change in appetite, change in bowel movements. Patient denies rashes, musculoskeletal complaints. No other specific complaints in a complete review of systems.   Objective:   Physical Exam   Well-developed well-nourished female in no acute distress. HEENT exam atraumatic, normocephalic, extraocular muscles are intact. Neck is supple. No jugular venous distention no thyromegaly. Chest clear to auscultation without increased work of breathing. Cardiac exam S1 and S2 are regular. Abdominal exam active bowel sounds, soft, nontender. Extremities no edema. Neurologic exam she is alert without any motor sensory deficits. Gait is normal.       Assessment & Plan:  Well visit. Health maintenance up-to-date.

## 2011-01-22 NOTE — Assessment & Plan Note (Signed)
She may need statin. We'll start treatment with antidiabetic medications.

## 2011-01-27 ENCOUNTER — Encounter (HOSPITAL_BASED_OUTPATIENT_CLINIC_OR_DEPARTMENT_OTHER)
Admission: RE | Admit: 2011-01-27 | Discharge: 2011-01-27 | Disposition: A | Discharge status: Home / Self Care | Payer: BC Managed Care – PPO | Source: Ambulatory Visit | Attending: Orthopedic Surgery | Admitting: Orthopedic Surgery

## 2011-01-27 LAB — BASIC METABOLIC PANEL
BUN: 12 mg/dL (ref 6–23)
Creatinine, Ser: 0.55 mg/dL (ref 0.50–1.10)
GFR calc Af Amer: >90 mL/min (ref >90)
GFR calc non Af Amer: >90 mL/min (ref >90)
Potassium: 3.8 mEq/L (ref 3.5–5.1)

## 2011-01-31 ENCOUNTER — Ambulatory Visit (HOSPITAL_BASED_OUTPATIENT_CLINIC_OR_DEPARTMENT_OTHER)
Admission: RE | Admit: 2011-01-31 | Discharge: 2011-01-31 | Disposition: A | Discharge status: Home / Self Care | Payer: BC Managed Care – PPO | Source: Ambulatory Visit | Attending: Orthopedic Surgery | Admitting: Orthopedic Surgery

## 2011-01-31 DIAGNOSIS — Z01810 Encounter for preprocedural cardiovascular examination: Secondary | ICD-10-CM | POA: Insufficient documentation

## 2011-01-31 DIAGNOSIS — E119 Type 2 diabetes mellitus without complications: Secondary | ICD-10-CM | POA: Insufficient documentation

## 2011-01-31 DIAGNOSIS — M65849 Other synovitis and tenosynovitis, unspecified hand: Secondary | ICD-10-CM | POA: Insufficient documentation

## 2011-01-31 DIAGNOSIS — E039 Hypothyroidism, unspecified: Secondary | ICD-10-CM | POA: Insufficient documentation

## 2011-01-31 DIAGNOSIS — Z87891 Personal history of nicotine dependence: Secondary | ICD-10-CM | POA: Insufficient documentation

## 2011-01-31 DIAGNOSIS — Z01812 Encounter for preprocedural laboratory examination: Secondary | ICD-10-CM | POA: Insufficient documentation

## 2011-01-31 DIAGNOSIS — I10 Essential (primary) hypertension: Secondary | ICD-10-CM | POA: Insufficient documentation

## 2011-01-31 DIAGNOSIS — M65839 Other synovitis and tenosynovitis, unspecified forearm: Secondary | ICD-10-CM | POA: Insufficient documentation

## 2011-01-31 DIAGNOSIS — M6530 Trigger finger, unspecified finger: Secondary | ICD-10-CM | POA: Insufficient documentation

## 2011-01-31 LAB — POCT HEMOGLOBIN-HEMACUE: Hemoglobin: 12.4 g/dL (ref 12.0–15.0)

## 2011-01-31 LAB — GLUCOSE, CAPILLARY: Glucose-Capillary: 105 mg/dL — ABNORMAL HIGH (ref 70–99)

## 2011-02-07 ENCOUNTER — Other Ambulatory Visit: Payer: Self-pay | Admitting: Internal Medicine

## 2011-02-07 NOTE — Op Note (Signed)
  Maria Hammond, ARMENDAREZ                ACCOUNT NO.:  [id number]  MEDICAL RECORD NO.:  [id number]  LOCATION:  CYT                          FACILITY:  [facility]  PHYSICIAN:  Cindee Salt, M.D.       DATE OF BIRTH:  1961-02-14  DATE OF PROCEDURE:  01/31/2011 DATE OF DISCHARGE:  01/12/2011                              OPERATIVE REPORT   PREOPERATIVE DIAGNOSIS:  Stenosing tenosynovitis, right ring finger.  POSTOPERATIVE DIAGNOSIS:  Stenosing tenosynovitis, right ring finger.  OPERATION:  Release of A1 pulley, right ring finger.  Otterness:  Cindee Salt, MD  ASSISTANT:  Betha Loa, MD  ANESTHESIA:  Forearm-based IV regional local infiltration.  HISTORY:  The patient is a 50 year old female with a history of diabetes, triggering of her right ring finger.  She has elected to undergo surgical release of the A1 pulley.  Pre, peri, and postop course have been discussed along with risks and complications.  She is aware there is no guarantee with surgery, possibility of infection, recurrence of injury to arteries, nerves, and tendons, incomplete relief of symptoms, and dystrophy.  In the preoperative area, the patient is seen, the extremity is marked by both the patient and Castilleja, and antibiotic is given.  PROCEDURE:  The patient was brought to the operating room where a forearm-based IV regional anesthetic was carried out without difficulty. She was prepped using ChloraPrep, supine position with the right arm free.  A 3-minute dry time was allowed.  A time-out taken confirming the patient and procedure.  An oblique incision was made over the A1 pulley of the right ring finger and carried down through the subcutaneous tissue.  Bleeders were electrocauterized with bipolar.  The dissection was carried down to the pulley.  The pulley was released on its radial aspect.  Small incision was made centrally in A2.  Partial tenosynovectomy was performed proximally to be certain there was not adherence  between the two flexor tendons.  Finger was placed through a full range motion.  No further triggering was noted.  The wound was irrigated.  The skin was closed with interrupted 5-0 Vicryl Rapide sutures.  Local infiltration with 0.25% Marcaine without epinephrine was given, approximately 4 mL was used.  Sterile compressive dressing with fingers free was applied.  On deflation of the tourniquet, all fingers immediately pinked.  She was taken to the recovery room for observation in satisfactory condition.  She will be discharged home to return the [facility] in 1 week on Vicodin.          ______________________________ Cindee Salt, M.D.     GK/MEDQ  D:  01/31/2011  T:  01/31/2011  Job:  [id number]  Electronically Signed by Cindee Salt M.D. on 02/07/2011 04:27:04 PM

## 2011-03-25 LAB — HM DIABETES EYE EXAM

## 2011-04-11 ENCOUNTER — Other Ambulatory Visit: Payer: Self-pay | Admitting: Internal Medicine

## 2011-05-02 ENCOUNTER — Other Ambulatory Visit: Payer: Self-pay | Admitting: Internal Medicine

## 2011-05-02 MED ORDER — SYNTHROID 75 MCG PO TABS: 75.0000 ug | ORAL_TABLET | Freq: Every day | ORAL | Status: DC

## 2011-05-02 NOTE — Telephone Encounter (Signed)
rx sent in electronically 

## 2011-05-02 NOTE — Telephone Encounter (Signed)
Pt would like generic synthroid 75 mcg #90 with 3 refills. [facility] [address] [phone]

## 2011-07-06 ENCOUNTER — Other Ambulatory Visit (INDEPENDENT_AMBULATORY_CARE_PROVIDER_SITE_OTHER): Payer: BC Managed Care – PPO

## 2011-07-06 ENCOUNTER — Other Ambulatory Visit: Payer: BC Managed Care – PPO

## 2011-07-06 DIAGNOSIS — E785 Hyperlipidemia, unspecified: Secondary | ICD-10-CM

## 2011-07-06 DIAGNOSIS — I10 Essential (primary) hypertension: Secondary | ICD-10-CM

## 2011-07-06 DIAGNOSIS — E119 Type 2 diabetes mellitus without complications: Secondary | ICD-10-CM

## 2011-07-06 LAB — BASIC METABOLIC PANEL
CO2: 27 mEq/L (ref 19–32)
Calcium: 9.2 mg/dL (ref 8.4–10.5)
Chloride: 105 mEq/L (ref 96–112)
Glucose, Bld: 138 mg/dL — ABNORMAL HIGH (ref 70–99)
Sodium: 139 mEq/L (ref 135–145)

## 2011-07-06 LAB — HEPATIC FUNCTION PANEL
ALT: 18 U/L (ref 0–35)
Bilirubin, Direct: 0 mg/dL (ref 0.0–0.3)
Total Bilirubin: 0.2 mg/dL — ABNORMAL LOW (ref 0.3–1.2)

## 2011-07-06 LAB — LIPID PANEL
Total CHOL/HDL Ratio: 2
VLDL: 12.8 mg/dL (ref 0.0–40.0)

## 2011-07-13 ENCOUNTER — Other Ambulatory Visit: Payer: Self-pay | Admitting: Internal Medicine

## 2011-07-13 ENCOUNTER — Ambulatory Visit: Payer: BC Managed Care – PPO | Admitting: Internal Medicine

## 2011-07-13 DIAGNOSIS — Z1231 Encounter for screening mammogram for malignant neoplasm of breast: Secondary | ICD-10-CM

## 2011-07-20 ENCOUNTER — Ambulatory Visit: Payer: BC Managed Care – PPO | Admitting: Internal Medicine

## 2011-07-27 ENCOUNTER — Encounter: Payer: Self-pay | Admitting: Internal Medicine

## 2011-07-27 ENCOUNTER — Ambulatory Visit (INDEPENDENT_AMBULATORY_CARE_PROVIDER_SITE_OTHER): Payer: BC Managed Care – PPO | Admitting: Internal Medicine

## 2011-07-27 VITALS — BP 110/64 | HR 76 | Temp 98.8°F | Ht 63.5 in | Wt 210.0 lb

## 2011-07-27 DIAGNOSIS — E119 Type 2 diabetes mellitus without complications: Secondary | ICD-10-CM

## 2011-07-27 DIAGNOSIS — E039 Hypothyroidism, unspecified: Secondary | ICD-10-CM

## 2011-07-27 DIAGNOSIS — I10 Essential (primary) hypertension: Secondary | ICD-10-CM

## 2011-07-27 NOTE — Assessment & Plan Note (Signed)
Well controlled Continue same meds 

## 2011-07-27 NOTE — Progress Notes (Signed)
Patient ID: Maria Hammond, female   DOB: February 24, 1961, 51 y.o.   MRN: [id number]  patient comes in for followup of multiple medical problems including type 2 diabetes, hyperlipidemia, hypertension. The patient does not check blood sugar or blood pressure at home. The patetient does not follow an exercise or diet program. The patient denies any polyuria, polydipsia.  In the past the patient has gone to diabetic treatment center. The patient is tolerating medications  Without difficulty. The patient does admit to medication compliance except she frequently does not take evening metformin because "i don't eat at night"  Past Medical History  Diagnosis Date  . DIABETES MELLITUS, TYPE II 11/13/2006  . HYPERLIPIDEMIA 11/13/2006  . HYPERTENSION 05/19/2007  . HYPOTHYROIDISM 11/13/2006    History   Social History  . Marital Status: Married    Spouse Name: N/A    Number of Children: N/A  . Years of Education: N/A   Occupational History  . Not on file.   Social History Main Topics  . Smoking status: Former Smoker    Quit date: 04/23/2010  . Smokeless tobacco: Not on file  . Alcohol Use: No  . Drug Use:   . Sexually Active:    Other Topics Concern  . Not on file   Social History Narrative  . No narrative on file    Past Surgical History  Procedure Date  . Cesarean section   . Tonsillectomy 1970    Family History  Problem Relation Age of Onset  . Diabetes Mother   . Breast cancer Sister   . Diabetes Brother   . Diabetes Brother   . Heart disease Father   . Kidney disease Father   . Prostate cancer Father     No Known Allergies  Current Outpatient Prescriptions on File Prior to Visit  Medication Sig Dispense Refill  . atorvastatin (LIPITOR) 10 MG tablet Take 1 tablet (10 mg total) by mouth daily.  30 tablet  11  . lisinopril (PRINIVIL,ZESTRIL) 5 MG tablet TAKE ONE TABLET BY MOUTH EVERY DAY  30 tablet  5  . metFORMIN (GLUCOPHAGE) 500 MG tablet Take 1 tablet (500 mg total) by  mouth 2 (two) times daily with a meal.  60 tablet  11  . SYNTHROID 75 MCG tablet Take 1 tablet (75 mcg total) by mouth daily.  90 each  3     patient denies chest pain, shortness of breath, orthopnea. Denies lower extremity edema, abdominal pain, change in appetite, change in bowel movements. Patient denies rashes, musculoskeletal complaints. No other specific complaints in a complete review of systems except she has noted a tingling sensation of left thigh  BP 110/64  Pulse 76  Temp(Src) 98.8 F (37.1 C) (Oral)  Ht 5' 3.5" (1.613 m)  Wt 210 lb (95.255 kg)  BMI 36.62 kg/m2  Well-developed well-nourished female in no acute distress. HEENT exam atraumatic, normocephalic, extraocular muscles are intact. Neck is supple. No jugular venous distention no thyromegaly. Chest clear to auscultation without increased work of breathing. Cardiac exam S1 and S2 are regular. Abdominal exam active bowel sounds, soft, nontender. Extremities no edema. Neurologic exam she is alert without any motor sensory deficits. Gait is normal.

## 2011-07-27 NOTE — Assessment & Plan Note (Signed)
Lab Results  Component Value Date   HGBA1C 8.3* 07/06/2011   Not as well controlled as i would like Compliance with metformin bid encouraged

## 2011-07-27 NOTE — Assessment & Plan Note (Signed)
Lab Results  Component Value Date   TSH 0.58 09/29/2010   controlled

## 2011-08-09 ENCOUNTER — Other Ambulatory Visit: Payer: Self-pay | Admitting: Internal Medicine

## 2011-08-24 ENCOUNTER — Ambulatory Visit
Admission: RE | Admit: 2011-08-24 | Discharge: 2011-08-24 | Disposition: A | Discharge status: Home / Self Care | Payer: BC Managed Care – PPO | Source: Ambulatory Visit | Attending: Internal Medicine | Admitting: Internal Medicine

## 2011-08-24 DIAGNOSIS — Z1231 Encounter for screening mammogram for malignant neoplasm of breast: Secondary | ICD-10-CM

## 2012-01-11 ENCOUNTER — Other Ambulatory Visit: Payer: Self-pay | Admitting: Internal Medicine

## 2012-01-18 ENCOUNTER — Other Ambulatory Visit (INDEPENDENT_AMBULATORY_CARE_PROVIDER_SITE_OTHER): Payer: BC Managed Care – PPO

## 2012-01-18 DIAGNOSIS — Z0000 Encounter for general adult medical examination without abnormal findings: Secondary | ICD-10-CM

## 2012-01-18 LAB — CBC WITH DIFFERENTIAL/PLATELET
Basophils Absolute: 0 10*3/uL (ref 0.0–0.1)
Eosinophils Absolute: 0.2 10*3/uL (ref 0.0–0.7)
HCT: 38.9 % (ref 36.0–46.0)
Lymphs Abs: 2.4 10*3/uL (ref 0.7–4.0)
MCHC: 32.3 g/dL (ref 30.0–36.0)
MCV: 86.5 fl (ref 78.0–100.0)
Monocytes Absolute: 0.6 10*3/uL (ref 0.1–1.0)
Neutrophils Relative %: 57.4 % (ref 43.0–77.0)
Platelets: 222 10*3/uL (ref 150.0–400.0)
RDW: 14.3 % (ref 11.5–14.6)
WBC: 7.5 10*3/uL (ref 4.5–10.5)

## 2012-01-18 LAB — BASIC METABOLIC PANEL
BUN: 12 mg/dL (ref 6–23)
CO2: 27 mEq/L (ref 19–32)
Chloride: 104 mEq/L (ref 96–112)
Creatinine, Ser: 0.6 mg/dL (ref 0.4–1.2)
Glucose, Bld: 102 mg/dL — ABNORMAL HIGH (ref 70–99)
Potassium: 4.2 mEq/L (ref 3.5–5.1)

## 2012-01-18 LAB — POCT URINALYSIS DIPSTICK
Bilirubin, UA: NEGATIVE
Ketones, UA: NEGATIVE
Nitrite, UA: NEGATIVE
Protein, UA: NEGATIVE
pH, UA: 7

## 2012-01-18 LAB — LIPID PANEL
Cholesterol: 160 mg/dL (ref 0–200)
HDL: 59.1 mg/dL (ref >39.00)
VLDL: 16.2 mg/dL (ref 0.0–40.0)

## 2012-01-18 LAB — HEPATIC FUNCTION PANEL
Bilirubin, Direct: 0 mg/dL (ref 0.0–0.3)
Total Bilirubin: 0.5 mg/dL (ref 0.3–1.2)

## 2012-01-18 LAB — TSH: TSH: 0.5 u[IU]/mL (ref 0.35–5.50)

## 2012-01-26 ENCOUNTER — Ambulatory Visit (INDEPENDENT_AMBULATORY_CARE_PROVIDER_SITE_OTHER): Payer: BC Managed Care – PPO | Admitting: Internal Medicine

## 2012-01-26 ENCOUNTER — Encounter: Payer: Self-pay | Admitting: Internal Medicine

## 2012-01-26 VITALS — BP 116/64 | Temp 98.8°F | Wt 201.0 lb

## 2012-01-26 DIAGNOSIS — E663 Overweight: Secondary | ICD-10-CM

## 2012-01-26 DIAGNOSIS — Z0000 Encounter for general adult medical examination without abnormal findings: Secondary | ICD-10-CM

## 2012-01-26 DIAGNOSIS — F17200 Nicotine dependence, unspecified, uncomplicated: Secondary | ICD-10-CM

## 2012-01-26 DIAGNOSIS — E669 Obesity, unspecified: Secondary | ICD-10-CM | POA: Insufficient documentation

## 2012-01-26 LAB — HM DIABETES FOOT EXAM

## 2012-01-26 MED ORDER — LEVOTHYROXINE SODIUM 75 MCG PO TABS: 75.0000 ug | ORAL_TABLET | Freq: Every day | ORAL | Status: DC

## 2012-01-26 NOTE — Assessment & Plan Note (Signed)
She quit 03/24/10

## 2012-01-26 NOTE — Progress Notes (Signed)
Patient ID: Maria Hammond, female   DOB: [birth year], 51 y.o.   MRN: [id number]  Past Medical History  Diagnosis Date  . DIABETES MELLITUS, TYPE II 11/13/2006  . HYPERLIPIDEMIA 11/13/2006  . HYPERTENSION 05/19/2007  . HYPOTHYROIDISM 11/13/2006    History   Social History  . Marital Status: Married    Spouse Name: N/A    Number of Children: N/A  . Years of Education: N/A   Occupational History  . Not on file.   Social History Main Topics  . Smoking status: Former Smoker    Quit date: 04/23/2010  . Smokeless tobacco: Not on file  . Alcohol Use: No  . Drug Use:   . Sexually Active:    Other Topics Concern  . Not on file   Social History Narrative  . No narrative on file    Past Surgical History  Procedure Date  . Cesarean section   . Tonsillectomy 1970    Family History  Problem Relation Age of Onset  . Diabetes Mother   . Breast cancer Sister   . Diabetes Brother   . Diabetes Brother   . Heart disease Father   . Kidney disease Father   . Prostate cancer Father     No Known Allergies  Current Outpatient Prescriptions on File Prior to Visit  Medication Sig Dispense Refill  . atorvastatin (LIPITOR) 10 MG tablet TAKE ONE TABLET BY MOUTH EVERY DAY  30 tablet  10  . lisinopril (PRINIVIL,ZESTRIL) 5 MG tablet TAKE ONE TABLET BY MOUTH EVERY DAY  90 tablet  3  . metFORMIN (GLUCOPHAGE) 500 MG tablet TAKE ONE TABLET BY MOUTH TWICE DAILY WITH MEALS  60 tablet  10     patient denies chest pain, shortness of breath, orthopnea. Denies lower extremity edema, abdominal pain, change in appetite, change in bowel movements. Patient denies rashes, musculoskeletal complaints. No other specific complaints in a complete review of systems.   BP 116/64  Temp 98.8 F (37.1 C) (Oral)  Wt 201 lb (91.173 kg)  LMP 12/20/2011  Well-developed well-nourished female in no acute distress. HEENT exam atraumatic, normocephalic, extraocular muscles are intact. Neck is supple. No jugular  venous distention no thyromegaly. Chest clear to auscultation without increased work of breathing. Cardiac exam S1 and S2 are regular. Abdominal exam active bowel sounds, soft, nontender. Extremities no edema. Neurologic exam she is alert without any motor sensory deficits. Gait is normal.  A/P- well visit Health maint utd She refuses immunizations  Weight loss discussed

## 2012-06-10 ENCOUNTER — Encounter: Payer: Self-pay | Admitting: Internal Medicine

## 2012-06-10 ENCOUNTER — Ambulatory Visit (INDEPENDENT_AMBULATORY_CARE_PROVIDER_SITE_OTHER): Payer: BC Managed Care – PPO | Admitting: Internal Medicine

## 2012-06-10 ENCOUNTER — Other Ambulatory Visit (HOSPITAL_COMMUNITY)
Admission: RE | Admit: 2012-06-10 | Discharge: 2012-06-10 | Disposition: A | Discharge status: Home / Self Care | Payer: BC Managed Care – PPO | Source: Ambulatory Visit | Attending: Internal Medicine | Admitting: Internal Medicine

## 2012-06-10 VITALS — BP 122/74 | HR 64 | Temp 98.5°F | Ht 63.5 in | Wt 205.0 lb

## 2012-06-10 DIAGNOSIS — Z23 Encounter for immunization: Secondary | ICD-10-CM

## 2012-06-10 DIAGNOSIS — E119 Type 2 diabetes mellitus without complications: Secondary | ICD-10-CM

## 2012-06-10 DIAGNOSIS — E039 Hypothyroidism, unspecified: Secondary | ICD-10-CM

## 2012-06-10 DIAGNOSIS — Z0000 Encounter for general adult medical examination without abnormal findings: Secondary | ICD-10-CM

## 2012-06-10 DIAGNOSIS — I10 Essential (primary) hypertension: Secondary | ICD-10-CM

## 2012-06-10 DIAGNOSIS — E785 Hyperlipidemia, unspecified: Secondary | ICD-10-CM

## 2012-06-10 DIAGNOSIS — Z01419 Encounter for gynecological examination (general) (routine) without abnormal findings: Secondary | ICD-10-CM | POA: Insufficient documentation

## 2012-06-10 LAB — BASIC METABOLIC PANEL
Calcium: 9.1 mg/dL (ref 8.4–10.5)
GFR: 139.41 mL/min (ref >60.00)
Glucose, Bld: 115 mg/dL — ABNORMAL HIGH (ref 70–99)
Potassium: 4.2 mEq/L (ref 3.5–5.1)
Sodium: 138 mEq/L (ref 135–145)

## 2012-06-10 LAB — HM DIABETES FOOT EXAM

## 2012-06-10 LAB — HEPATIC FUNCTION PANEL
ALT: 15 U/L (ref 0–35)
AST: 13 U/L (ref 0–37)
Albumin: 3.7 g/dL (ref 3.5–5.2)
Alkaline Phosphatase: 77 U/L (ref 39–117)
Total Protein: 7.9 g/dL (ref 6.0–8.3)

## 2012-06-10 LAB — LIPID PANEL
Cholesterol: 162 mg/dL (ref 0–200)
HDL: 60.3 mg/dL (ref >39.00)
Triglycerides: 87 mg/dL (ref 0.0–149.0)

## 2012-06-10 LAB — TSH: TSH: 0.55 u[IU]/mL (ref 0.35–5.50)

## 2012-06-10 NOTE — Progress Notes (Signed)
patient comes in for followup of multiple medical problems including type 2 diabetes, hyperlipidemia, hypertension. The patient does not check blood sugar or blood pressure at home. The patetient does not follow an exercise or diet program. The patient denies any polyuria, polydipsia.  In the past the patient has gone to diabetic treatment center. The patient is tolerating medications  Without difficulty. The patient does admit to medication compliance.   Past Medical History  Diagnosis Date  . DIABETES MELLITUS, TYPE II 11/13/2006  . HYPERLIPIDEMIA 11/13/2006  . HYPERTENSION 05/19/2007  . HYPOTHYROIDISM 11/13/2006    History   Social History  . Marital Status: Married    Spouse Name: N/A    Number of Children: N/A  . Years of Education: N/A   Occupational History  . Not on file.   Social History Main Topics  . Smoking status: Former Smoker    Quit date: 04/23/2010  . Smokeless tobacco: Not on file  . Alcohol Use: No  . Drug Use:   . Sexually Active:    Other Topics Concern  . Not on file   Social History Narrative  . No narrative on file    Past Surgical History  Procedure Laterality Date  . Cesarean section    . Tonsillectomy  1970    Family History  Problem Relation Age of Onset  . Diabetes Mother   . Breast cancer Sister   . Diabetes Brother   . Diabetes Brother   . Heart disease Father   . Kidney disease Father   . Prostate cancer Father     No Known Allergies  Current Outpatient Prescriptions on File Prior to Visit  Medication Sig Dispense Refill  . atorvastatin (LIPITOR) 10 MG tablet TAKE ONE TABLET BY MOUTH EVERY DAY  30 tablet  10  . cyanocobalamin 500 MCG tablet Take 500 mcg by mouth daily.      Marland Kitchen levothyroxine (SYNTHROID, LEVOTHROID) 75 MCG tablet Take 1 tablet (75 mcg total) by mouth daily.  90 tablet  3  . lisinopril (PRINIVIL,ZESTRIL) 5 MG tablet TAKE ONE TABLET BY MOUTH EVERY DAY  90 tablet  3  . metFORMIN (GLUCOPHAGE) 500 MG tablet TAKE ONE  TABLET BY MOUTH TWICE DAILY WITH MEALS  60 tablet  10   No current facility-administered medications on file prior to visit.     patient denies chest pain, shortness of breath, orthopnea. Denies lower extremity edema, abdominal pain, change in appetite, change in bowel movements. Patient denies rashes, musculoskeletal complaints. No other specific complaints in a complete review of systems.   BP 122/74  Pulse 64  Temp(Src) 98.5 F (36.9 C) (Oral)  Ht 5' 3.5" (1.613 m)  Wt 205 lb (92.987 kg)  BMI 35.74 kg/m2  Well-developed well-nourished female in no acute distress. HEENT exam atraumatic, normocephalic, extraocular muscles are intact. Neck is supple. No jugular venous distention no thyromegaly. Chest clear to auscultation without increased work of breathing. Cardiac exam S1 and S2 are regular. Abdominal exam active bowel sounds, soft, nontender. Extremities no edema. Neurologic exam she is alert without any motor sensory deficits. Gait is normal.  Well visit- health maint utd  Discussed need for aggressive weight loss and daily exercise

## 2012-08-05 ENCOUNTER — Other Ambulatory Visit: Payer: Self-pay

## 2012-08-05 DIAGNOSIS — Z1231 Encounter for screening mammogram for malignant neoplasm of breast: Secondary | ICD-10-CM

## 2012-08-09 ENCOUNTER — Other Ambulatory Visit: Payer: Self-pay | Admitting: Internal Medicine

## 2012-08-12 ENCOUNTER — Encounter: Payer: Self-pay | Admitting: *Deleted

## 2012-08-27 ENCOUNTER — Ambulatory Visit
Admission: RE | Admit: 2012-08-27 | Discharge: 2012-08-27 | Disposition: A | Discharge status: Home / Self Care | Payer: BC Managed Care – PPO | Source: Ambulatory Visit

## 2012-08-27 DIAGNOSIS — Z1231 Encounter for screening mammogram for malignant neoplasm of breast: Secondary | ICD-10-CM

## 2012-08-29 ENCOUNTER — Ambulatory Visit: Payer: BC Managed Care – PPO

## 2012-10-08 ENCOUNTER — Ambulatory Visit (INDEPENDENT_AMBULATORY_CARE_PROVIDER_SITE_OTHER): Payer: BC Managed Care – PPO | Admitting: Internal Medicine

## 2012-10-08 ENCOUNTER — Encounter: Payer: Self-pay | Admitting: Internal Medicine

## 2012-10-08 VITALS — BP 116/68 | HR 64 | Temp 98.4°F | Wt 209.0 lb

## 2012-10-08 DIAGNOSIS — E039 Hypothyroidism, unspecified: Secondary | ICD-10-CM

## 2012-10-08 DIAGNOSIS — E1159 Type 2 diabetes mellitus with other circulatory complications: Secondary | ICD-10-CM

## 2012-10-08 DIAGNOSIS — E663 Overweight: Secondary | ICD-10-CM

## 2012-10-08 DIAGNOSIS — F17200 Nicotine dependence, unspecified, uncomplicated: Secondary | ICD-10-CM

## 2012-10-08 DIAGNOSIS — I10 Essential (primary) hypertension: Secondary | ICD-10-CM

## 2012-10-08 DIAGNOSIS — E119 Type 2 diabetes mellitus without complications: Secondary | ICD-10-CM

## 2012-10-08 LAB — LIPID PANEL
Cholesterol: 154 mg/dL (ref 0–200)
VLDL: 13.4 mg/dL (ref 0.0–40.0)

## 2012-10-08 LAB — HEPATIC FUNCTION PANEL
ALT: 20 U/L (ref 0–35)
AST: 16 U/L (ref 0–37)
Alkaline Phosphatase: 78 U/L (ref 39–117)
Bilirubin, Direct: 0.1 mg/dL (ref 0.0–0.3)
Total Bilirubin: 0.5 mg/dL (ref 0.3–1.2)

## 2012-10-08 LAB — BASIC METABOLIC PANEL
BUN: 11 mg/dL (ref 6–23)
Calcium: 9.5 mg/dL (ref 8.4–10.5)
Creatinine, Ser: 0.6 mg/dL (ref 0.4–1.2)
GFR: 128.93 mL/min (ref >60.00)
Glucose, Bld: 135 mg/dL — ABNORMAL HIGH (ref 70–99)

## 2012-10-08 NOTE — Assessment & Plan Note (Signed)
Previously uncontrolled Will check labs today  Discussed need for aggressive weight loss.

## 2012-10-08 NOTE — Assessment & Plan Note (Signed)
Will check labs today Continue same meds BP will likely improve with weight loss

## 2012-10-08 NOTE — Progress Notes (Signed)
Patient ID: Maria Hammond, female   DOB: 1960/07/14, 52 y.o.   MRN: [id number]  patient comes in for followup of multiple medical problems including type 2 diabetes, hyperlipidemia, hypertension. The patient does not check blood sugar or blood pressure at home. The patetient does not follow an exercise or diet program. The patient denies any polyuria, polydipsia.  In the past the patient has gone to diabetic treatment center. The patient is tolerating medications  Without difficulty. The patient does admit to medication compliance.   Past Medical History  Diagnosis Date  . DIABETES MELLITUS, TYPE II 11/13/2006  . HYPERLIPIDEMIA 11/13/2006  . HYPERTENSION 05/19/2007  . HYPOTHYROIDISM 11/13/2006    History   Social History  . Marital Status: Married    Spouse Name: N/A    Number of Children: N/A  . Years of Education: N/A   Occupational History  . Not on file.   Social History Main Topics  . Smoking status: Former Smoker    Quit date: 04/23/2010  . Smokeless tobacco: Not on file  . Alcohol Use: No  . Drug Use:   . Sexually Active:    Other Topics Concern  . Not on file   Social History Narrative  . No narrative on file    Past Surgical History  Procedure Laterality Date  . Cesarean section    . Tonsillectomy  1970    Family History  Problem Relation Age of Onset  . Diabetes Mother   . Breast cancer Sister   . Diabetes Brother   . Diabetes Brother   . Heart disease Father   . Kidney disease Father   . Prostate cancer Father     No Known Allergies  Current Outpatient Prescriptions on File Prior to Visit  Medication Sig Dispense Refill  . atorvastatin (LIPITOR) 10 MG tablet TAKE ONE TABLET BY MOUTH EVERY DAY  30 tablet  10  . levothyroxine (SYNTHROID, LEVOTHROID) 75 MCG tablet Take 1 tablet (75 mcg total) by mouth daily.  90 tablet  3  . lisinopril (PRINIVIL,ZESTRIL) 5 MG tablet TAKE ONE TABLET BY MOUTH EVERY DAY  90 tablet  0  . metFORMIN (GLUCOPHAGE) 500 MG tablet  TAKE ONE TABLET BY MOUTH TWICE DAILY WITH MEALS  60 tablet  10   No current facility-administered medications on file prior to visit.     patient denies chest pain, shortness of breath, orthopnea. Denies lower extremity edema, abdominal pain, change in appetite, change in bowel movements. Patient denies rashes, musculoskeletal complaints. No other specific complaints in a complete review of systems.   BP 116/68  Pulse 64  Temp(Src) 98.4 F (36.9 C) (Oral)  Wt 209 lb (94.802 kg)  BMI 36.44 kg/m2   Well-developed well-nourished female in no acute distress. HEENT exam atraumatic, normocephalic, extraocular muscles are intact. Neck is supple. No jugular venous distention no thyromegaly. Chest clear to auscultation without increased work of breathing. Cardiac exam S1 and S2 are regular. Abdominal exam active bowel sounds, soft, nontender. Extremities no edema. Neurologic exam she is alert without any motor sensory deficits. Gait is normal.

## 2012-10-08 NOTE — Assessment & Plan Note (Signed)
Quit 04/23/10 Will remove from problem list

## 2012-10-08 NOTE — Assessment & Plan Note (Signed)
Previously controlled 

## 2012-10-08 NOTE — Assessment & Plan Note (Signed)
This is her main problem DM will resolve with weight loss

## 2012-10-09 LAB — MICROALBUMIN / CREATININE URINE RATIO: Creatinine,U: 102 mg/dL

## 2012-10-21 ENCOUNTER — Other Ambulatory Visit: Payer: Self-pay | Admitting: *Deleted

## 2012-10-21 MED ORDER — GLIMEPIRIDE 2 MG PO TABS: 2.0000 mg | ORAL_TABLET | Freq: Every day | ORAL | Status: DC

## 2012-12-03 ENCOUNTER — Other Ambulatory Visit: Payer: Self-pay | Admitting: Internal Medicine

## 2013-01-27 ENCOUNTER — Other Ambulatory Visit: Payer: Self-pay | Admitting: Internal Medicine

## 2013-02-10 ENCOUNTER — Ambulatory Visit (INDEPENDENT_AMBULATORY_CARE_PROVIDER_SITE_OTHER): Payer: BC Managed Care – PPO | Admitting: Internal Medicine

## 2013-02-10 ENCOUNTER — Encounter: Payer: Self-pay | Admitting: Internal Medicine

## 2013-02-10 VITALS — BP 102/72 | HR 72 | Temp 98.0°F | Wt 214.0 lb

## 2013-02-10 DIAGNOSIS — E1159 Type 2 diabetes mellitus with other circulatory complications: Secondary | ICD-10-CM

## 2013-02-10 DIAGNOSIS — L821 Other seborrheic keratosis: Secondary | ICD-10-CM

## 2013-02-10 DIAGNOSIS — I10 Essential (primary) hypertension: Secondary | ICD-10-CM

## 2013-02-10 DIAGNOSIS — E119 Type 2 diabetes mellitus without complications: Secondary | ICD-10-CM

## 2013-02-10 DIAGNOSIS — E785 Hyperlipidemia, unspecified: Secondary | ICD-10-CM

## 2013-02-10 DIAGNOSIS — E039 Hypothyroidism, unspecified: Secondary | ICD-10-CM

## 2013-02-10 LAB — TSH: TSH: 0.73 u[IU]/mL (ref 0.35–5.50)

## 2013-02-10 LAB — MICROALBUMIN / CREATININE URINE RATIO
Creatinine,U: 80.1 mg/dL
Microalb Creat Ratio: 3.7 mg/g (ref 0.0–30.0)
Microalb, Ur: 3 mg/dL — ABNORMAL HIGH (ref 0.0–1.9)

## 2013-02-10 LAB — HEMOGLOBIN A1C: Hgb A1c MFr Bld: 9 % — ABNORMAL HIGH (ref 4.6–6.5)

## 2013-02-10 NOTE — Assessment & Plan Note (Signed)
Wt Readings from Last 3 Encounters:  10/08/12 209 lb (94.802 kg)  06/10/12 205 lb (92.987 kg)  01/26/12 201 lb (91.173 kg)   She needs to lose weight- Daily exercise and low calorie diet Lab Results  Component Value Date   HGBA1C 8.7* 10/08/2012   Check labs today

## 2013-02-10 NOTE — Progress Notes (Signed)
patient comes in for followup of multiple medical problems including type 2 diabetes, hyperlipidemia, hypertension. The patient does not check blood sugar or blood pressure at home. The patetient does not follow an exercise or diet program. The patient denies any polyuria, polydipsia.  In the past the patient has gone to diabetic treatment center. The patient is tolerating medications  Without difficulty. The patient does admit to medication compliance.   Past Medical History  Diagnosis Date  . DIABETES MELLITUS, TYPE II 11/13/2006  . HYPERLIPIDEMIA 11/13/2006  . HYPERTENSION 05/19/2007  . HYPOTHYROIDISM 11/13/2006  . TOBACCO USE 12/16/2007    Quit 04/23/10      History   Social History  . Marital Status: Married    Spouse Name: N/A    Number of Children: N/A  . Years of Education: N/A   Occupational History  . Not on file.   Social History Main Topics  . Smoking status: Former Smoker    Quit date: 04/23/2010  . Smokeless tobacco: Not on file  . Alcohol Use: No  . Drug Use:   . Sexual Activity:    Other Topics Concern  . Not on file   Social History Narrative  . No narrative on file    Past Surgical History  Procedure Laterality Date  . Cesarean section    . Tonsillectomy  1970    Family History  Problem Relation Age of Onset  . Diabetes Mother   . Breast cancer Sister   . Diabetes Brother   . Diabetes Brother   . Heart disease Father   . Kidney disease Father   . Prostate cancer Father     No Known Allergies  Current Outpatient Prescriptions on File Prior to Visit  Medication Sig Dispense Refill  . atorvastatin (LIPITOR) 10 MG tablet TAKE ONE TABLET BY MOUTH EVERY DAY  30 tablet  5  . glimepiride (AMARYL) 2 MG tablet Take 1 tablet (2 mg total) by mouth daily before breakfast.  30 tablet  5  . lisinopril (PRINIVIL,ZESTRIL) 5 MG tablet TAKE ONE TABLET BY MOUTH ONCE DAILY  90 tablet  0  . metFORMIN (GLUCOPHAGE) 500 MG tablet TAKE ONE TABLET BY MOUTH TWICE DAILY  WITH MEALS  60 tablet  5  . SYNTHROID 75 MCG tablet TAKE ONE TABLET BY MOUTH EVERY DAY  90 tablet  0   No current facility-administered medications on file prior to visit.     patient denies chest pain, shortness of breath, orthopnea. Denies lower extremity edema, abdominal pain, change in appetite, change in bowel movements. Patient denies rashes, musculoskeletal complaints. No other specific complaints in a complete review of systems.   Reviewed vitals  Well-developed well-nourished female in no acute distress. HEENT exam atraumatic, normocephalic, extraocular muscles are intact. Neck is supple. No jugular venous distention no thyromegaly. Chest clear to auscultation without increased work of breathing. Cardiac exam S1 and S2 are regular. Abdominal exam active bowel sounds, soft, nontender. Extremities no edema. Neurologic exam she is alert without any motor sensory deficits. Gait is normal. Derm- multiple seborrheic keratosis

## 2013-02-10 NOTE — Assessment & Plan Note (Signed)
Well controlled Continue same meds 

## 2013-02-10 NOTE — Assessment & Plan Note (Signed)
Adequate control, Continue same meds

## 2013-02-17 ENCOUNTER — Other Ambulatory Visit: Payer: Self-pay | Admitting: Internal Medicine

## 2013-02-17 ENCOUNTER — Telehealth: Payer: Self-pay | Admitting: *Deleted

## 2013-02-17 DIAGNOSIS — E119 Type 2 diabetes mellitus without complications: Secondary | ICD-10-CM

## 2013-02-17 NOTE — Telephone Encounter (Signed)
Pt called and lvm stating she is unable to come to this New Pt appt on Nov. 11th at 8:15 am. Please call and reschedule this appt. Thank you.

## 2013-03-04 ENCOUNTER — Ambulatory Visit: Payer: BC Managed Care – PPO | Admitting: Internal Medicine

## 2013-03-06 ENCOUNTER — Ambulatory Visit (INDEPENDENT_AMBULATORY_CARE_PROVIDER_SITE_OTHER): Payer: BC Managed Care – PPO | Admitting: Internal Medicine

## 2013-03-06 ENCOUNTER — Encounter: Payer: Self-pay | Admitting: Internal Medicine

## 2013-03-06 VITALS — BP 122/62 | HR 79 | Temp 98.2°F | Resp 10 | Ht 64.0 in | Wt 214.0 lb

## 2013-03-06 DIAGNOSIS — E119 Type 2 diabetes mellitus without complications: Secondary | ICD-10-CM

## 2013-03-06 MED ORDER — METFORMIN HCL 1000 MG PO TABS: 1000.0000 mg | ORAL_TABLET | Freq: Two times a day (BID) | ORAL | Status: DC

## 2013-03-06 NOTE — Patient Instructions (Signed)
Increase Metformin to 500 mg in am and 1000 mg with dinner for the next 4 days, then increase further to 1000 mg 2x a day. Continue Amaryl (Glimepiride) 2 mg in am. Please return in 1 month with your sugar log - check sugars once a day, rotating checks.  PATIENT INSTRUCTIONS FOR TYPE 2 DIABETES:  DIET AND EXERCISE Diet and exercise is an important part of diabetic treatment.  We recommended aerobic exercise in the form of brisk walking (working between 40-60% of maximal aerobic capacity, similar to brisk walking) for 150 minutes per week (such as 30 minutes five days per week) along with 3 times per week performing 'resistance' training (using various gauge rubber tubes with handles) 5-10 exercises involving the major muscle groups (upper body, lower body and core) performing 10-15 repetitions (or near fatigue) each exercise. Start at half the above goal but build slowly to reach the above goals. If limited by weight, joint pain, or disability, we recommend daily walking in a swimming pool with water up to waist to reduce pressure from joints while allow for adequate exercise.    BLOOD GLUCOSES Monitoring your blood glucoses is important for continued management of your diabetes. Please check your blood glucoses 2-4 times a day: fasting, before meals and at bedtime (you can rotate these measurements - e.g. one day check before the 3 meals, the next day check before 2 of the meals and before bedtime, etc.   HYPOGLYCEMIA (low blood sugar) Hypoglycemia is usually a reaction to not eating, exercising, or taking too much insulin/ other diabetes drugs.  Symptoms include tremors, sweating, hunger, confusion, headache, etc. Treat IMMEDIATELY with 15 grams of Carbs:   4 glucose tablets    cup regular juice/soda   2 tablespoons raisins   4 teaspoons sugar   1 tablespoon honey Recheck blood glucose in 15 mins and repeat above if still symptomatic/blood glucose <100. Please contact our office at  [phone] if you have questions about how to next handle your insulin.  RECOMMENDATIONS TO REDUCE YOUR RISK OF DIABETIC COMPLICATIONS: * Take your prescribed MEDICATION(S). * Follow a DIABETIC diet: Complex carbs, fiber rich foods, heart healthy fish twice weekly, (monounsaturated and polyunsaturated) fats * AVOID saturated/trans fats, high fat foods, >2,300 mg salt per day. * EXERCISE at least 5 times a week for 30 minutes or preferably daily.  * DO NOT SMOKE OR DRINK more than 1 drink a day. * Check your FEET every day. Do not wear tightfitting shoes. Contact us if you develop an ulcer * See your EYE doctor once a year or more if needed * Get a FLU shot once a year * Get a PNEUMONIA vaccine once before and once after age 43 years  GOALS:  * Your Hemoglobin A1c of <7%  * fasting sugars need to be <130 * after meals sugars need to be <180 (2h after you start eating) * Your Systolic BP should be 140 or lower  * Your Diastolic BP should be 80 or lower  * Your HDL (Good Cholesterol) should be 40 or higher  * Your LDL (Bad Cholesterol) should be 100 or lower  * Your Triglycerides should be 150 or lower  * Your Urine microalbumin (kidney function) should be <30 * Your Body Mass Index should be 25 or lower   We will be glad to help you achieve these goals. Our telephone number is: [phone].

## 2013-03-06 NOTE — Progress Notes (Signed)
Patient ID: Maria Hammond, female   DOB: 07-03-1960, 52 y.o.   MRN: [id number]  HPI: Maria Hammond is a 52 y.o.-year-old female, referred by her PCP, Dr. Cato Mulligan, for management of DM2, insulin-dependent, uncontrolled, without complications.  Patient has been diagnosed with diabetes in 2008; she has not been on insulin before. Last hemoglobin A1c was: Lab Results  Component Value Date   HGBA1C 9.0* 02/10/2013   HGBA1C 8.7* 10/08/2012   HGBA1C 8.3* 06/10/2012   Pt is on a regimen of: - Metformin 500 mg po bid - Amaryl 2 mg once a day  Pt does not check her sugars, she has hypoglycemia awareness - feels if she delays a meal: headache. Meter: Relion.   Pt's meals are: - Breakfast: 4 strips [place] bacon + 1 egg, 1 hash brown, and 1 bisquit + coffee - splenda and cream - snack: cheese PB crackers - Lunch: PB+J or chicken sandwich + banana + trail mix + bag of chips - snack: none or PB crackers - Dinner: fried chicken + rolls; spaghetti and french toast - varies  - no CKD, last BUN/creatinine:  Lab Results  Component Value Date   BUN 11 10/08/2012   CREATININE 0.6 10/08/2012  On lisinopril. - last set of lipids: Lab Results  Component Value Date   CHOL 154 10/08/2012   HDL 61.70 10/08/2012   LDLCALC 79 10/08/2012   LDLDIRECT 139.9 01/12/2011   TRIG 67.0 10/08/2012   CHOLHDL 2 10/08/2012  On Lipitor. - last eye exam was in 05/2012. No DR.  - no numbness and tingling in her feet.Left upper foot hurting.   I reviewed her chart and she also has a history of HTN, HL, hypothyroidism.   Pt has FH of DM in mother, 2 brothers, daughter.  ROS: Constitutional: +weight gain,+ fatigue, no subjective hyperthermia/hypothermia, + poor sleep, + nocturia Eyes: + blurry vision, no xerophthalmia ENT: no sore throat, no nodules palpated in throat, no dysphagia/odynophagia, no hoarseness Cardiovascular: no CP/+ SOB/no palpitations/leg swelling Respiratory: no cough/+ SOB Gastrointestinal: no  N/V/D/C Musculoskeletal: +muscle aches/no joint aches Skin: no rashes Neurological: no tremors/numbness/tingling/dizziness Psychiatric: no depression/anxiety  Past Medical History  Diagnosis Date  . DIABETES MELLITUS, TYPE II 11/13/2006  . HYPERLIPIDEMIA 11/13/2006  . HYPERTENSION 05/19/2007  . HYPOTHYROIDISM 11/13/2006  . TOBACCO USE 12/16/2007    Quit 04/23/10     Past Surgical History  Procedure Laterality Date  . Cesarean section    . Tonsillectomy  1970   History   Social History  . Marital Status: Married    Spouse Name: N/A    Number of Children: 2  . Years of Education: N/A   Occupational History  . DSD rec at [employer]   Social History Main Topics  . Smoking status: Former Smoker    Quit date: 04/23/2010  . Smokeless tobacco: Not on file  . Alcohol Use: No  . Drug Use: No  . Sexual Activity: Yes    Partners: Male   Social History Narrative   Regular exercise: not at this time   Caffeine use: Diet Mt Dew   Current Outpatient Prescriptions on File Prior to Visit  Medication Sig Dispense Refill  . atorvastatin (LIPITOR) 10 MG tablet TAKE ONE TABLET BY MOUTH EVERY DAY  30 tablet  5  . glimepiride (AMARYL) 2 MG tablet Take 1 tablet (2 mg total) by mouth daily before breakfast.  30 tablet  5  . lisinopril (PRINIVIL,ZESTRIL) 5 MG tablet TAKE ONE TABLET BY MOUTH  ONCE DAILY  90 tablet  0  . metFORMIN (GLUCOPHAGE) 500 MG tablet TAKE ONE TABLET BY MOUTH TWICE DAILY WITH MEALS  60 tablet  5  . SYNTHROID 75 MCG tablet TAKE ONE TABLET BY MOUTH EVERY DAY  90 tablet  0   No current facility-administered medications on file prior to visit.   No Known Allergies Family History  Problem Relation Age of Onset  . Diabetes Mother   . Breast cancer Sister   . Diabetes Brother   . Diabetes Brother   . Heart disease Father   . Kidney disease Father   . Prostate cancer Father    PE: BP 122/62  Pulse 79  Temp(Src) 98.2 F (36.8 C) (Oral)  Resp 10  Ht 5\' 4"  (1.626 m)  Wt  214 lb (97.07 kg)  BMI 36.72 kg/m2  SpO2 98% Wt Readings from Last 3 Encounters:  03/06/13 214 lb (97.07 kg)  02/10/13 214 lb (97.07 kg)  10/08/12 209 lb (94.802 kg)   Constitutional: overweight, in NAD Eyes: PERRLA, EOMI, no exophthalmos ENT: moist mucous membranes, no thyromegaly, no cervical lymphadenopathy Cardiovascular: RRR, No MRG Respiratory: CTA B Gastrointestinal: abdomen soft, NT, ND, BS+ Musculoskeletal: no deformities, strength intact in all 4 Skin: moist, warm, no rashes Neurological: no tremor with outstretched hands, DTR normal in all 4  ASSESSMENT: 1. DM2, non-insulin-dependent, uncontrolled, without complications  PLAN:  1. Patient with a 6-year h/o increasingly uncontrolled diabetes, on oral antidiabetic regimen, which became insufficient. She is also gaining weight and has a very poor diet. We discussed about her diet and ways to improve it.  - she does not check her sugars at home as she does not like needles (but does have a meter, lancets, and strips), and in this case, is very difficult to make a decision for treatment improvement - I suggested to:  - Strongly advised her to start checking sugars at different times of the day - check once a day, rotating checks - increase metformin to target dose of 1000 mg twice a day - For now, continue Amaryl at 2 mg daily the morning - given sugar log and advised how to fill it and to bring it at next appt  - given foot care handout and explained the principles  - given instructions for hypoglycemia management "15-15 rule"  - advised for yearly eye exams - she is up-to-date - Return to clinic in 1 mo with sugar log

## 2013-04-08 ENCOUNTER — Ambulatory Visit: Payer: BC Managed Care – PPO | Admitting: Internal Medicine

## 2013-04-10 ENCOUNTER — Encounter: Payer: Self-pay | Admitting: Internal Medicine

## 2013-04-10 ENCOUNTER — Ambulatory Visit (INDEPENDENT_AMBULATORY_CARE_PROVIDER_SITE_OTHER): Payer: BC Managed Care – PPO | Admitting: Internal Medicine

## 2013-04-10 VITALS — BP 118/58 | HR 75 | Temp 98.4°F | Resp 12 | Wt 209.0 lb

## 2013-04-10 DIAGNOSIS — E119 Type 2 diabetes mellitus without complications: Secondary | ICD-10-CM

## 2013-04-10 NOTE — Progress Notes (Signed)
Patient ID: Maria Hammond, female   DOB: June 23, 1960, 52 y.o.   MRN: [id number]  HPI: Maria Hammond is a 52 y.o.-year-old female, returning for f/u for DM2, dx 2008, non-insulin-dependent, uncontrolled, without complications.  Last hemoglobin A1c was: Lab Results  Component Value Date   HGBA1C 9.0* 02/10/2013   HGBA1C 8.7* 10/08/2012   HGBA1C 8.3* 06/10/2012   Pt is on a regimen of: - Metformin 1000 mg po bid - Amaryl 2 mg once a day in am She gets lightheaded 4 h after b'fast, while at work >> does not have time to check sugars bu the feeling resolves after having a snack  Pt check her sugars 3-4 x a day: - am: 81-125 - 2h after b'fast (Sun and Thu when she is off): 76-161 - lunch: 96-138 - 2h after lunch: 84-150 - dinner: 75-156 (higher after [employer]) - 2h after dinner: 99-72 - bedtime; 95, 132  she has hypoglycemia awareness - feels if she delays a meal: headache. Meter: Relion.   Pt's meals are: - Breakfast: 4 strips [place] bacon + 1 egg, 1 hash brown, and 1 bisquit + coffee - splenda and cream - snack: cheese PB crackers - Lunch: PB+J or chicken sandwich + banana + trail mix + bag of chips - snack: none or PB crackers - Dinner: fried chicken + rolls; spaghetti and french toast - varies  - no CKD, last BUN/creatinine:  Lab Results  Component Value Date   BUN 11 10/08/2012   CREATININE 0.6 10/08/2012  On lisinopril. - last set of lipids: Lab Results  Component Value Date   CHOL 154 10/08/2012   HDL 61.70 10/08/2012   LDLCALC 79 10/08/2012   LDLDIRECT 139.9 01/12/2011   TRIG 67.0 10/08/2012   CHOLHDL 2 10/08/2012  On Lipitor. - last eye exam was in 05/2012. No DR.  - no numbness and tingling in her feet.  I reviewed her chart and she also has a history of HTN, HL, hypothyroidism.   ROS: Constitutional: +weight loss,+ fatigue, no subjective hyperthermia/hypothermia Eyes: no blurry vision, no xerophthalmia ENT: no sore throat, no nodules palpated in throat, no  dysphagia/odynophagia, no hoarseness Cardiovascular: no CP/+ SOB/no palpitations/leg swelling Respiratory: no cough/+ SOB Gastrointestinal: no N/V/D/C Musculoskeletal: NO muscle aches/no joint aches Skin: no rashes Neurological: no tremors/numbness/tingling/dizziness  PE: BP 118/58  Pulse 75  Temp(Src) 98.4 F (36.9 C) (Oral)  Resp 12  Wt 209 lb (94.802 kg)  SpO2 95% Wt Readings from Last 3 Encounters:  04/10/13 209 lb (94.802 kg)  03/06/13 214 lb (97.07 kg)  02/10/13 214 lb (97.07 kg)   Constitutional: overweight, in NAD Eyes: PERRLA, EOMI, no exophthalmos ENT: moist mucous membranes, no thyromegaly, no cervical lymphadenopathy Cardiovascular: RRR, No MRG Respiratory: CTA B Gastrointestinal: abdomen soft, NT, ND, BS+ Musculoskeletal: no deformities, strength intact in all 4 Skin: moist, warm, no rashes Neurological: no tremor with outstretched hands, DTR normal in all 4  ASSESSMENT: 1. DM2, non-insulin-dependent, uncontrolled, without complications  PLAN:  1. Patient with a 6-year h/o increasingly uncontrolled diabetes, now with much improved control, with almost all sugars at goal. Also, congratulated her for her weight loss. - she had the PNA vaccine last year, refuses flu vaccine  - I suggested to:  - continue metformin at target dose of 1000 mg twice a day - stop Amaryl - advised for yearly eye exams - she is up-to-date - Return to clinic in 1 mo with sugar log

## 2013-04-10 NOTE — Patient Instructions (Signed)
-   continue metformin at target dose of 1000 mg twice a day  - stop Amaryl Please return in 3 months with your sugar log.   HAPPY HOLIDAYS!

## 2013-04-30 ENCOUNTER — Other Ambulatory Visit: Payer: Self-pay | Admitting: Internal Medicine

## 2013-06-03 ENCOUNTER — Other Ambulatory Visit: Payer: Self-pay | Admitting: Internal Medicine

## 2013-07-08 LAB — HM DIABETES EYE EXAM

## 2013-07-10 ENCOUNTER — Ambulatory Visit (INDEPENDENT_AMBULATORY_CARE_PROVIDER_SITE_OTHER): Payer: BC Managed Care – PPO | Admitting: Internal Medicine

## 2013-07-10 ENCOUNTER — Encounter: Payer: Self-pay | Admitting: Internal Medicine

## 2013-07-10 VITALS — BP 116/60 | HR 74 | Temp 98.4°F | Resp 12 | Wt 203.0 lb

## 2013-07-10 DIAGNOSIS — E119 Type 2 diabetes mellitus without complications: Secondary | ICD-10-CM

## 2013-07-10 LAB — HEMOGLOBIN A1C
Hgb A1c MFr Bld: 8.2 % — ABNORMAL HIGH (ref <5.7)
Mean Plasma Glucose: 189 mg/dL — ABNORMAL HIGH (ref <117)

## 2013-07-10 MED ORDER — METFORMIN HCL 1000 MG PO TABS: ORAL_TABLET | ORAL | Status: DC

## 2013-07-10 NOTE — Progress Notes (Signed)
Patient ID: Maria Hammond, female   DOB: 1961-04-07, 53 y.o.   MRN: [id number]  HPI: Maria Hammond is a 53 y.o.-year-old female, returning for f/u for DM2, dx 2008, non-insulin-dependent, uncontrolled, without complications. Last visit 3 mo ago.  Last hemoglobin A1c was: Lab Results  Component Value Date   HGBA1C 9.0* 02/10/2013   HGBA1C 8.7* 10/08/2012   HGBA1C 8.3* 06/10/2012   Pt is on a regimen of: - Metformin 1000 mg po bid We stopped Amaryl 2 mg in 03/2013 as her sugars were at goal and even had some lows and got lightheaded 4 h after b'fast, while at work (resolved after having a snack).   Pt check her sugars 2 x a day - a little worse when she eats sweets: - am - 2:30: 81-125 >> 67-197x 1 (130s usually)  - 2h after b'fast (Sun and Thu when she is off): 76-161 >> n/c - lunch - 11 am: 96-138 >> n/c - 2h after lunch: 84-150 >> n/c - dinner - 3 pm: 75-156 (higher after [employer]) >> 70-80 - 2h after dinner: 99-72 >> 140-150 - bedtime; 95, 132 >> n/c Lowest: 67; she has hypoglycemia awareness - feels if she delays a meal: headache. Highest sugars 197.  Meter: Relion.   Pt's meals are: - Breakfast (4 am): 4 strips [place] bacon + 1 egg, 1 hash brown, and 1 bisquit + coffee - splenda and cream - snack: cheese PB crackers - Lunch: PB+J or chicken sandwich + banana + trail mix + bag of chips - snack: none or PB crackers - Dinner: fried chicken + rolls; spaghetti and french toast - varies  - no CKD, last BUN/creatinine:  Lab Results  Component Value Date   BUN 11 10/08/2012   CREATININE 0.6 10/08/2012  On lisinopril. - last set of lipids: Lab Results  Component Value Date   CHOL 154 10/08/2012   HDL 61.70 10/08/2012   LDLCALC 79 10/08/2012   LDLDIRECT 139.9 01/12/2011   TRIG 67.0 10/08/2012   CHOLHDL 2 10/08/2012  On Lipitor. - last eye exam was in 06/2013. No DR. Dr Ricki Miller. - no numbness and tingling in her feet.  I reviewed her chart and she also has a history of  HTN, HL, hypothyroidism.   ROS: Constitutional: +weight loss,+ fatigue, no subjective hyperthermia/hypothermia Eyes: no blurry vision, no xerophthalmia ENT: no sore throat, no nodules palpated in throat, no dysphagia/odynophagia, no hoarseness Cardiovascular: no CP/+ SOB/no palpitations/leg swelling Respiratory: no cough/+ SOB Gastrointestinal: no N/V/D/C Musculoskeletal: NO muscle aches/no joint aches Skin: no rashes Neurological: no tremors/numbness/tingling/dizziness  I reviewed pt's medications, allergies, PMH, social hx, family hx and no changes required, except as mentioned above.  PE: BP 116/60  Pulse 74  Temp(Src) 98.4 F (36.9 C) (Oral)  Resp 12  Wt 203 lb (92.08 kg)  SpO2 97% Wt Readings from Last 3 Encounters:  07/10/13 203 lb (92.08 kg)  04/10/13 209 lb (94.802 kg)  03/06/13 214 lb (97.07 kg)   Constitutional: overweight, in NAD Eyes: PERRLA, EOMI, no exophthalmos ENT: moist mucous membranes, no thyromegaly, no cervical lymphadenopathy Cardiovascular: RRR, No MRG Respiratory: CTA B Gastrointestinal: abdomen soft, NT, ND, BS+ Musculoskeletal: no deformities, strength intact in all 4 Skin: moist, warm, no rashes  ASSESSMENT: 1. DM2, non-insulin-dependent, uncontrolled, without complications  PLAN:  1. Patient with a 6-year h/o increasingly uncontrolled diabetes, now with much improved control, with almost all sugars at goal. I again congratulated her for her weight loss. - she had  the PNA vaccine in 2013, refused flu vaccine  - I suggested to:  Patient Instructions  Please stop at [facility] lab downstairs.  Decrease Metformin to 500 mg in am and 1000 mg in pm. Please return in 3 months with your sugar log.  - advised for yearly eye exams - she is up to date - check A1c today - Return to clinic in 1 mo with sugar log   Office Visit on 07/10/2013  Component Date Value Ref Range Status  . HM Diabetic Eye Exam 07/08/2013 No Retinopathy  No Retinopathy Final    Dr Ricki Miller  . Hemoglobin A1C 07/10/2013 8.2* <5.7 % Final   Comment:                                                                                                 According to the ADA Clinical Practice Recommendations for 2011, when                          HbA1c is used as a screening test:                                                       >=6.5%   Diagnostic of Diabetes Mellitus                                     (if abnormal result is confirmed)                                                     5.7-6.4%   Increased risk of developing Diabetes Mellitus                                                     References:Diagnosis and Classification of Diabetes Mellitus,Diabetes                          SEGB,1517,61(YWVPX 1):S62-S69 and Standards of Medical Care in                                  Diabetes - 2011,Diabetes [id number],54 (Suppl 1):S11-S61.                             . Mean Plasma Glucose 07/10/2013 189* <117 mg/dL Final   HbA1c higher than expected. Will go by the sugar log.

## 2013-07-10 NOTE — Patient Instructions (Signed)
Please stop at [facility] lab downstairs.  Decrease Metformin to 500 mg in am and 1000 mg in pm. Please return in 3 months with your sugar log.

## 2013-08-04 ENCOUNTER — Other Ambulatory Visit: Payer: Self-pay

## 2013-08-04 DIAGNOSIS — Z1231 Encounter for screening mammogram for malignant neoplasm of breast: Secondary | ICD-10-CM

## 2013-08-13 ENCOUNTER — Encounter: Payer: Self-pay | Admitting: Internal Medicine

## 2013-08-13 ENCOUNTER — Ambulatory Visit (INDEPENDENT_AMBULATORY_CARE_PROVIDER_SITE_OTHER): Payer: BC Managed Care – PPO | Admitting: Internal Medicine

## 2013-08-13 VITALS — BP 114/62 | HR 76 | Temp 98.2°F | Ht 64.0 in | Wt 203.0 lb

## 2013-08-13 DIAGNOSIS — E119 Type 2 diabetes mellitus without complications: Secondary | ICD-10-CM

## 2013-08-13 LAB — LIPID PANEL
CHOLESTEROL: 142 mg/dL (ref 0–200)
HDL: 58.3 mg/dL (ref >39.00)
LDL Cholesterol: 72 mg/dL (ref 0–99)
Total CHOL/HDL Ratio: 2
Triglycerides: 58 mg/dL (ref 0.0–149.0)
VLDL: 11.6 mg/dL (ref 0.0–40.0)

## 2013-08-13 LAB — HEPATIC FUNCTION PANEL
ALT: 17 U/L (ref 0–35)
AST: 13 U/L (ref 0–37)
Albumin: 3.6 g/dL (ref 3.5–5.2)
Alkaline Phosphatase: 76 U/L (ref 39–117)
BILIRUBIN DIRECT: 0.1 mg/dL (ref 0.0–0.3)
BILIRUBIN TOTAL: 0.5 mg/dL (ref 0.3–1.2)
TOTAL PROTEIN: 7.5 g/dL (ref 6.0–8.3)

## 2013-08-13 LAB — BASIC METABOLIC PANEL
BUN: 10 mg/dL (ref 6–23)
CO2: 28 mEq/L (ref 19–32)
CREATININE: 0.5 mg/dL (ref 0.4–1.2)
Calcium: 9 mg/dL (ref 8.4–10.5)
Chloride: 105 mEq/L (ref 96–112)
GFR: 154.02 mL/min (ref >60.00)
Glucose, Bld: 107 mg/dL — ABNORMAL HIGH (ref 70–99)
Potassium: 3.9 mEq/L (ref 3.5–5.1)
Sodium: 139 mEq/L (ref 135–145)

## 2013-08-13 LAB — MICROALBUMIN / CREATININE URINE RATIO
Creatinine,U: 47.1 mg/dL
Microalb Creat Ratio: 1.1 mg/g (ref 0.0–30.0)
Microalb, Ur: 0.5 mg/dL (ref 0.0–1.9)

## 2013-08-13 LAB — HEMOGLOBIN A1C: HEMOGLOBIN A1C: 8 % — AB (ref 4.6–6.5)

## 2013-08-13 NOTE — Progress Notes (Signed)
Pre visit review using our clinic review tool, if applicable. No additional management support is needed unless otherwise documented below in the visit note. 

## 2013-08-13 NOTE — Progress Notes (Signed)
Dm- home [facility] 89s  Sees dr Cruzita Lederer  htn- tolerating meds  Lipids- tolerating meds  Hypothyroid-  Lab Results  Component Value Date   TSH 0.73 02/10/2013   Past Medical History  Diagnosis Date  . DIABETES MELLITUS, TYPE II 11/13/2006  . HYPERLIPIDEMIA 11/13/2006  . HYPERTENSION 05/19/2007  . HYPOTHYROIDISM 11/13/2006  . TOBACCO USE 12/16/2007    Quit 04/23/10      History   Social History  . Marital Status: Married    Spouse Name: N/A    Number of Children: N/A  . Years of Education: N/A   Occupational History  . Not on file.   Social History Main Topics  . Smoking status: Former Smoker    Quit date: 04/23/2010  . Smokeless tobacco: Not on file  . Alcohol Use: No  . Drug Use: No  . Sexual Activity: Yes    Partners: Male   Other Topics Concern  . Not on file   Social History Narrative   Regular exercise: not at this time   Caffeine use: Diet Mt Dew    Past Surgical History  Procedure Laterality Date  . Cesarean section    . Tonsillectomy  1970    Family History  Problem Relation Age of Onset  . Diabetes Mother   . Breast cancer Sister   . Diabetes Brother   . Diabetes Brother   . Heart disease Father   . Kidney disease Father   . Prostate cancer Father     No Known Allergies  Current Outpatient Prescriptions on File Prior to Visit  Medication Sig Dispense Refill  . atorvastatin (LIPITOR) 10 MG tablet TAKE ONE TABLET BY MOUTH ONCE DAILY  30 tablet  5  . levothyroxine (SYNTHROID, LEVOTHROID) 75 MCG tablet TAKE ONE TABLET BY MOUTH ONCE DAILY  90 tablet  1  . lisinopril (PRINIVIL,ZESTRIL) 5 MG tablet TAKE ONE TABLET BY MOUTH ONCE DAILY  90 tablet  1  . metFORMIN (GLUCOPHAGE) 1000 MG tablet Take by mouth 500 mg in am and 1000 mg in pm  180 tablet  3   No current facility-administered medications on file prior to visit.     patient denies chest pain, shortness of breath, orthopnea. Denies lower extremity edema, abdominal pain, change in appetite,  change in bowel movements. Patient denies rashes, musculoskeletal complaints. No other specific complaints in a complete review of systems.   BP 114/62  Pulse 76  Temp(Src) 98.2 F (36.8 C) (Oral)  Ht 5\' 4"  (1.626 m)  Wt 203 lb (92.08 kg)  BMI 34.83 kg/m2  Well-developed well-nourished female in no acute distress. HEENT exam atraumatic, normocephalic, extraocular muscles are intact. Neck is supple. No jugular venous distention no thyromegaly. Chest clear to auscultation without increased work of breathing. Cardiac exam S1 and S2 are regular. Abdominal exam active bowel sounds, soft, nontender. Extremities no edema. Neurologic exam she is alert without any motor sensory deficits. Gait is normal.  DM- has regular f/u with endocrinology

## 2013-08-22 ENCOUNTER — Telehealth: Payer: Self-pay

## 2013-08-22 NOTE — Telephone Encounter (Signed)
Relevant patient education assigned to patient using Emmi. ° °

## 2013-08-28 ENCOUNTER — Ambulatory Visit
Admission: RE | Admit: 2013-08-28 | Discharge: 2013-08-28 | Disposition: A | Discharge status: Home / Self Care | Payer: BC Managed Care – PPO | Source: Ambulatory Visit

## 2013-08-28 DIAGNOSIS — Z1231 Encounter for screening mammogram for malignant neoplasm of breast: Secondary | ICD-10-CM

## 2013-10-06 ENCOUNTER — Ambulatory Visit (INDEPENDENT_AMBULATORY_CARE_PROVIDER_SITE_OTHER): Payer: BC Managed Care – PPO | Admitting: Internal Medicine

## 2013-10-06 ENCOUNTER — Encounter: Payer: Self-pay | Admitting: Internal Medicine

## 2013-10-06 VITALS — BP 118/62 | HR 71 | Temp 98.0°F | Resp 16 | Ht 63.5 in | Wt 202.8 lb

## 2013-10-06 DIAGNOSIS — E119 Type 2 diabetes mellitus without complications: Secondary | ICD-10-CM

## 2013-10-06 MED ORDER — METFORMIN HCL 1000 MG PO TABS: ORAL_TABLET | ORAL | Status: DC

## 2013-10-06 NOTE — Patient Instructions (Signed)
  Please increase Metformin to 1000 mg in am and 1000 mg in pm. Please return in 1.5 months with your sugar log.

## 2013-10-06 NOTE — Progress Notes (Signed)
Patient ID: Maria Hammond, female   DOB: [birth year], 53 y.o.   MRN: [id number]  HPI: Maria Hammond is a 53 y.o.-year-old female, returning for f/u for DM2, dx 2008, non-insulin-dependent, uncontrolled, without complications. Last visit 3 mo ago.  Last hemoglobin A1c was: Lab Results  Component Value Date   HGBA1C 8.0* 08/13/2013   HGBA1C 8.2* 07/10/2013   HGBA1C 9.0* 02/10/2013   Pt is on a regimen of: - Metformin 1000 mg po bid >> 500 in am and 1000 at night We stopped Amaryl 2 mg in 03/2013 as her sugars were at goal and even had some lows and got lightheaded 4 h after b'fast, while at work (resolved after having a snack).   Pt check her sugars 2 x a day - a little worse lately - watermelon, strawberry shortcake: - am - 2:30: 81-125 >> 67-197x 1 (130s usually) >> 116-162 (187 x 1) - 2h after b'fast (Sun and Thu when she is off): 76-161 >> n/c  - lunch - 11 am: 96-138 >> n/c >> 167 - 2h after lunch: 84-150 >> n/c >> n/c  - dinner - 3 pm: 75-156 (higher after [employer]) >> 70-80 >> 62, 96-134 (222 x 1) - 2h after dinner: 99-72 >> 140-150 - bedtime; 95, 132 >> n/c Lowest: 62 x 1; she has hypoglycemia awareness - feels if she delays a meal: headache. Highest sugars 222.  Meter: Relion.   Pt's meals are: - Breakfast (4 am): 4 strips [place] bacon + 1 egg, 1 hash brown, and 1 bisquit + coffee - splenda and cream - snack: cheese PB crackers - Lunch: PB+J or chicken sandwich + banana + trail mix + bag of chips - snack: none or PB crackers - Dinner: fried chicken + rolls; spaghetti and french toast - varies  - no CKD, last BUN/creatinine:  Lab Results  Component Value Date   BUN 10 08/13/2013   CREATININE 0.5 08/13/2013  On lisinopril. - last set of lipids: Lab Results  Component Value Date   CHOL 142 08/13/2013   HDL 58.30 08/13/2013   LDLCALC 72 08/13/2013   LDLDIRECT 139.9 01/12/2011   TRIG 58.0 08/13/2013   CHOLHDL 2 08/13/2013  On Lipitor. - last eye exam was in 06/2013. No  DR. Dr Ricki Miller. - no numbness and tingling in her feet. She will see a podiatrist  ROS: Constitutional: no weight gain/loss,+ fatigue, no subjective hyperthermia/hypothermia Eyes: no blurry vision, no xerophthalmia ENT: no sore throat, no nodules palpated in throat, no dysphagia/odynophagia, no hoarseness Cardiovascular: no CP/+ SOB/no palpitations/leg swelling Respiratory: no cough/+ SOB Gastrointestinal: no N/V/D/C/+ bloating Musculoskeletal: + muscle aches/+ joint aches Skin: no rashes Neurological: no tremors/numbness/tingling/dizziness  I reviewed pt's medications, allergies, PMH, social hx, family hx and no changes required, except as mentioned above.  PE: BP 118/62  Pulse 71  Temp(Src) 98 F (36.7 C)  Resp 16  Ht 5' 3.5" (1.613 m)  Wt 202 lb 12.8 oz (91.989 kg)  BMI 35.36 kg/m2  SpO2 93% Wt Readings from Last 3 Encounters:  10/06/13 202 lb 12.8 oz (91.989 kg)  08/13/13 203 lb (92.08 kg)  07/10/13 203 lb (92.08 kg)   Constitutional: overweight, in NAD Eyes: PERRLA, EOMI, no exophthalmos ENT: moist mucous membranes, no thyromegaly, no cervical lymphadenopathy Cardiovascular: RRR, No MRG Respiratory: CTA B Gastrointestinal: abdomen soft, NT, ND, BS+ Musculoskeletal: no deformities, strength intact in all 4 Skin: moist, warm, no rashes  ASSESSMENT: 1. DM2, non-insulin-dependent, uncontrolled, without complications  PLAN:  1. Patient with uncontrolled diabetes, now with improved control, but with higher sugars over the last 1.5 mo 2/2 increased fruit and cake use. - I suggested to:  Patient Instructions   Please increase Metformin to 1000 mg in am and 1000 mg in pm. Please return in 1.5 months with your sugar log. - I suggested to add [place] but she prefers to work on her diet for the next 1.5 mo and see if this is sti;ll needed when she returns  - advised for yearly eye exams - she is up to date - will check A1c at next visit - Return to clinic in 1.5 mo  with sugar log

## 2013-10-30 ENCOUNTER — Other Ambulatory Visit: Payer: Self-pay | Admitting: Internal Medicine

## 2013-11-24 ENCOUNTER — Encounter: Payer: Self-pay | Admitting: Internal Medicine

## 2013-11-24 ENCOUNTER — Ambulatory Visit (INDEPENDENT_AMBULATORY_CARE_PROVIDER_SITE_OTHER): Payer: BC Managed Care – PPO | Admitting: Internal Medicine

## 2013-11-24 VITALS — BP 102/60 | HR 69 | Temp 97.7°F | Resp 12 | Wt 203.0 lb

## 2013-11-24 DIAGNOSIS — E119 Type 2 diabetes mellitus without complications: Secondary | ICD-10-CM

## 2013-11-24 LAB — HEMOGLOBIN A1C: HEMOGLOBIN A1C: 8.6 % — AB (ref 4.6–6.5)

## 2013-11-24 MED ORDER — SITAGLIPTIN PHOSPHATE 100 MG PO TABS: 100.0000 mg | ORAL_TABLET | Freq: Every day | ORAL | Status: DC

## 2013-11-24 NOTE — Progress Notes (Signed)
Patient ID: Maria Hammond, female   DOB: October 21, 1960, 53 y.o.   MRN: [id number]  HPI: Maria Hammond is a 53 y.o.-year-old female, returning for f/u for DM2, dx 2008, non-insulin-dependent, uncontrolled, without complications. Last visit 1.5 mo ago.  Last hemoglobin A1c was: Lab Results  Component Value Date   HGBA1C 8.0* 08/13/2013   HGBA1C 8.2* 07/10/2013   HGBA1C 9.0* 02/10/2013   Pt is on a regimen of: - Metformin 1000 mg po bid  We stopped Amaryl 2 mg in 03/2013 as her sugars were at goal and even had some lows and got lightheaded 4 h after b'fast, while at work (resolved after having a snack).   Pt check her sugars 1-2 x a day - improved: - am - 2:30: 81-125 >> 67-197x 1 (130s usually) >> 116-162 (187 x 1) >> 108-152, mostly <140 - 2h after b'fast (Sun and Thu when she is off): 76-161 >> n/c  - lunch - 11 am: 96-138 >> n/c >> 167 >> 89 - 2h after lunch: 84-150 >> n/c >> n/c >> 99-144 - dinner - 3 pm: 75-156 (higher after [employer]) >> 70-80 >> 62, 96-134 (222 x 1) >> 131 - 2h after dinner: 99-72 >> 140-150 - bedtime; 95, 132 >> n/c Lowest: 108; she has hypoglycemia awareness - feels if she delays a meal: headache. Highest sugars 222 >> 152.  Meter: Relion.   Pt's meals are: - Breakfast (4 am): 4 strips [place] bacon + 1 egg, 1 hash brown, and 1 bisquit + coffee - splenda and cream - snack: cheese PB crackers - Lunch: PB+J or chicken sandwich + banana + trail mix + bag of chips - snack: none or PB crackers - Dinner: fried chicken + rolls; spaghetti and french toast - varies  - no CKD, last BUN/creatinine:  Lab Results  Component Value Date   BUN 10 08/13/2013   CREATININE 0.5 08/13/2013  On lisinopril. - last set of lipids: Lab Results  Component Value Date   CHOL 142 08/13/2013   HDL 58.30 08/13/2013   LDLCALC 72 08/13/2013   LDLDIRECT 139.9 01/12/2011   TRIG 58.0 08/13/2013   CHOLHDL 2 08/13/2013  On Lipitor. - last eye exam was in 06/2013. No DR. Dr Ricki Miller. - no  numbness and tingling in her feet. She will see a podiatrist  ROS: Constitutional: no weight gain/loss,+ fatigue, no subjective hyperthermia/hypothermia Eyes: no blurry vision, no xerophthalmia ENT: no sore throat, no nodules palpated in throat, no dysphagia/odynophagia, no hoarseness Cardiovascular: no CP/+ SOB/no palpitations/leg swelling Respiratory: no cough/+ SOB Gastrointestinal: no N/V/D/C Musculoskeletal: no muscle aches/ joint aches Skin: no rashes Neurological: no tremors/numbness/tingling/dizziness  I reviewed pt's medications, allergies, PMH, social hx, family hx and no changes required, except as mentioned above.  PE: BP 102/60  Pulse 69  Temp(Src) 97.7 F (36.5 C) (Oral)  Resp 12  Wt 203 lb (92.08 kg)  SpO2 98% Wt Readings from Last 3 Encounters:  11/24/13 203 lb (92.08 kg)  10/06/13 202 lb 12.8 oz (91.989 kg)  08/13/13 203 lb (92.08 kg)   Constitutional: overweight, in NAD Eyes: PERRLA, EOMI, no exophthalmos ENT: moist mucous membranes, no thyromegaly, no cervical lymphadenopathy Cardiovascular: RRR, No MRG Respiratory: CTA B Gastrointestinal: abdomen soft, NT, ND, BS+ Musculoskeletal: no deformities, strength intact in all 4 Skin: moist, warm, no rashes  ASSESSMENT: 1. DM2, non-insulin-dependent, uncontrolled, without complications  PLAN:  1. Patient with uncontrolled diabetes, now with improved control, only on Metformin. At last visit, sugars a  little higher, but they improved recently.  - I suggested to:  Patient Instructions  Please continue Metformin 1000 mg in am and 1000 mg in pm. Please return in 3 months with your sugar log. -We can add Januvia or Invokana if needed in the future, but sugars continue to improve now >> not needed - she is up to date with eye exams - will check A1c today - did not see podiatry yet - Return to clinic in 3 mo with sugar log   Office Visit on 11/24/2013  Component Date Value Ref Range Status  . Hemoglobin A1C  11/24/2013 8.6* 4.6 - 6.5 % Final   Glycemic Control Guidelines for People with Diabetes:Non Diabetic:  <6%Goal of Therapy: <7%Additional Action Suggested:  >8%    HbA1c increased >> will try to add Januvia 100 mg daily.

## 2013-11-24 NOTE — Patient Instructions (Signed)
Please continue Metformin 1000 mg in am and 1000 mg in pm. Please return in 3 months with your sugar log. Please stop at the lab.

## 2013-11-26 ENCOUNTER — Telehealth: Payer: Self-pay

## 2013-11-26 ENCOUNTER — Telehealth: Payer: Self-pay | Admitting: Internal Medicine

## 2013-11-26 NOTE — Telephone Encounter (Signed)
Please read note below and advise.  

## 2013-11-26 NOTE — Telephone Encounter (Signed)
Diabetic bundle. Pt's last A1C was 8.6 from 8/3. Medication changes were made pt coming back for appointment on 11/2 A1C will be rechecked then.

## 2013-11-26 NOTE — Telephone Encounter (Signed)
Patient stated,the Januvia 100 mg that was prescribed was to expensive, she couldn't afford it, is there something else she can prescribe?  Please advise

## 2013-11-27 ENCOUNTER — Other Ambulatory Visit: Payer: Self-pay | Admitting: *Deleted

## 2013-11-27 ENCOUNTER — Other Ambulatory Visit: Payer: Self-pay | Admitting: Internal Medicine

## 2013-11-27 MED ORDER — LINAGLIPTIN 5 MG PO TABS: 5.0000 mg | ORAL_TABLET | Freq: Every day | ORAL | Status: DC

## 2013-11-27 NOTE — Telephone Encounter (Signed)
Called pt and lvm advising her per Dr Arman Filter note. Rx for Tradjenta 5 mg sent to pt's pharmacy.

## 2013-11-27 NOTE — Telephone Encounter (Signed)
Let's try to sent Tradjenta 5 mg daily in am instead. #30, 1 refill

## 2013-11-28 ENCOUNTER — Other Ambulatory Visit: Payer: Self-pay | Admitting: *Deleted

## 2013-11-28 ENCOUNTER — Telehealth: Payer: Self-pay | Admitting: Internal Medicine

## 2013-11-28 MED ORDER — GLIPIZIDE ER 5 MG PO TB24: ORAL_TABLET | ORAL | Status: DC

## 2013-11-28 NOTE — Telephone Encounter (Signed)
We need to find a replacement for januvia the new one called in is still too expensive because she needs a drug that has a generic.

## 2013-11-28 NOTE — Telephone Encounter (Signed)
Ok, let's call in Glipizide XL 5 mg daily in am, before b'fast. Try not to skip meals while on this.

## 2013-11-28 NOTE — Telephone Encounter (Signed)
Called pt and lvm advising her per Dr Arman Filter note. Advised pt if she had any questions to call.

## 2013-11-28 NOTE — Telephone Encounter (Signed)
I know that you asked me to begin a PA for the [place]. Please read note below and advise.

## 2014-01-21 ENCOUNTER — Other Ambulatory Visit: Payer: Self-pay | Admitting: Internal Medicine

## 2014-02-06 ENCOUNTER — Other Ambulatory Visit: Payer: Self-pay

## 2014-02-06 ENCOUNTER — Encounter: Payer: Self-pay | Admitting: Internal Medicine

## 2014-02-23 ENCOUNTER — Encounter: Payer: Self-pay | Admitting: Internal Medicine

## 2014-02-23 ENCOUNTER — Ambulatory Visit (INDEPENDENT_AMBULATORY_CARE_PROVIDER_SITE_OTHER): Payer: BC Managed Care – PPO | Admitting: Internal Medicine

## 2014-02-23 VITALS — BP 104/60 | HR 78 | Temp 98.5°F | Resp 12 | Wt 204.0 lb

## 2014-02-23 DIAGNOSIS — E119 Type 2 diabetes mellitus without complications: Secondary | ICD-10-CM

## 2014-02-23 LAB — HEMOGLOBIN A1C: Hgb A1c MFr Bld: 7.6 % — ABNORMAL HIGH (ref 4.6–6.5)

## 2014-02-23 NOTE — Patient Instructions (Signed)
Please continue Metformin 1000 mg in am and 1000 mg in pm. Continue Glipizide XL 5 mg in am Please return in 3 months with your sugar log. Please stop at the lab.

## 2014-02-23 NOTE — Addendum Note (Signed)
Addended by: Netta Neat D on: 02/23/2014 09:17 AM   Modules accepted: Orders

## 2014-02-23 NOTE — Progress Notes (Addendum)
Patient ID: Maria Hammond, female   DOB: Oct 16, 1960, 53 y.o.   MRN: [id number]  HPI: Maria Hammond is a 53 y.o.-year-old female, returning for f/u for DM2, dx 2008, non-insulin-dependent, uncontrolled, without complications. Last visit 2 mo ago.  Last hemoglobin A1c was: Lab Results  Component Value Date   HGBA1C 8.6* 11/24/2013   HGBA1C 8.0* 08/13/2013   HGBA1C 8.2* 07/10/2013   Pt is on a regimen of: - Metformin 1000 mg po bid  - Glipizide XL 5 mg in am She could not afford Januvia and Tradjenta We stopped Amaryl 2 mg in 03/2013 as her sugars were at goal and even had some lows and got lightheaded 4 h after b'fast, while at work (resolved after having a snack).   Pt check her sugars 1-2 x a day - improved - 2 week ave 113! - am - 2:30: 81-125 >> 67-197x 1 (130s usually) >> 116-162 (187 x 1) >> 108-152, mostly <140 >> 65, 97-130 - 2h after b'fast (Sun and Thu when she is off): 76-161 >> n/c  - lunch - 11 am: 96-138 >> n/c >> 167 >> 89 >> n/c - 2h after lunch: 84-150 >> n/c >> n/c >> 99-144 >> n/c - dinner - 3 pm: 75-156 (higher after [employer]) >> 70-80 >> 62, 96-134 (222 x 1) >> 131 >> 70, 90-120 - 2h after dinner: 99-72 >> 140-150 >> n/c - bedtime; 95, 132 >> n/c Lowest: 108 >> 65 now; she has hypoglycemia awareness - feels if she delays a meal: headache. Highest sugars 222 >> 152 >> 140s  Meter: Relion.   Pt's meals are (reviewed from last visit): - Breakfast (4 am): 4 strips [place] bacon + 1 egg, 1 hash brown, and 1 bisquit + coffee - splenda and cream - snack: cheese PB crackers - Lunch: PB+J or chicken sandwich + banana + trail mix + bag of chips - snack: none or PB crackers - Dinner: fried chicken + rolls; spaghetti and french toast - varies  - no CKD, last BUN/creatinine:  Lab Results  Component Value Date   BUN 10 08/13/2013   CREATININE 0.5 08/13/2013  On lisinopril. - last set of lipids: Lab Results  Component Value Date   CHOL 142 08/13/2013   HDL 58.30  08/13/2013   LDLCALC 72 08/13/2013   LDLDIRECT 139.9 01/12/2011   TRIG 58.0 08/13/2013   CHOLHDL 2 08/13/2013  On Lipitor. - last eye exam was in 06/2013. No DR. Dr Ricki Miller. - no numbness and tingling in her feet. She will see a podiatrist, did not see him yet. ROS: Constitutional: no weight gain/loss, + fatigue, no subjective hyperthermia/hypothermia Eyes: no blurry vision, no xerophthalmia ENT: no sore throat, no nodules palpated in throat, no dysphagia/odynophagia, no hoarseness Cardiovascular: no CP/SOB/no palpitations/leg swelling Respiratory: no cough/SOB Gastrointestinal: no N/V/D/C Musculoskeletal: + muscle aches (legs - stands on concrete all day)/ joint aches Skin: no rashes Neurological: no tremors/numbness/tingling/dizziness  I reviewed pt's medications, allergies, PMH, social hx, family hx and no changes required, except as mentioned above.  PE: BP 104/60 mmHg  Pulse 78  Temp(Src) 98.5 F (36.9 C) (Oral)  Resp 12  Wt 204 lb (92.534 kg)  SpO2 96% Body mass index is 35.57 kg/(m^2).  Wt Readings from Last 3 Encounters:  02/23/14 204 lb (92.534 kg)  11/24/13 203 lb (92.08 kg)  10/06/13 202 lb 12.8 oz (91.989 kg)   Constitutional: overweight, in NAD Eyes: PERRLA, EOMI, no exophthalmos ENT: moist mucous membranes, no  thyromegaly, no cervical lymphadenopathy Cardiovascular: RRR, No MRG Respiratory: CTA B Gastrointestinal: abdomen soft, NT, ND, BS+ Musculoskeletal: no deformities, strength intact in all 4 Skin: moist, warm, no rashes  ASSESSMENT: 1. DM2, non-insulin-dependent, uncontrolled, without complications  PLAN:  1. Patient with uncontrolled diabetes, now with improved control, only on Metformin. At last visit, sugars a little higher, but they improved recently.  - I suggested to:  Patient Instructions  Please continue Metformin 1000 mg in am and 1000 mg in pm. Continue Glipizide XL 5 mg in am Please return in 3 months with your sugar log. Please  stop at the lab. -We can add Invokana if needed in the future, but sugars continue to improve now >> not needed - she is up to date with eye exams - will check A1c today - will refer to podiatry  - refuses the flu vaccine  - Return to clinic in 3 mo with sugar log   Office Visit on 02/23/2014  Component Date Value Ref Range Status  . Hgb A1c MFr Bld 02/23/2014 7.6* 4.6 - 6.5 % Final   Glycemic Control Guidelines for People with Diabetes:Non Diabetic:  <6%Goal of Therapy: <7%Additional Action Suggested:  >8%    HbA1c is better!

## 2014-02-23 NOTE — Addendum Note (Signed)
Addended by: Philemon Kingdom on: 02/23/2014 10:55 AM   Modules accepted: Orders

## 2014-02-25 ENCOUNTER — Other Ambulatory Visit: Payer: Self-pay | Admitting: Internal Medicine

## 2014-03-02 ENCOUNTER — Encounter: Payer: Self-pay | Admitting: Podiatry

## 2014-03-02 ENCOUNTER — Ambulatory Visit (INDEPENDENT_AMBULATORY_CARE_PROVIDER_SITE_OTHER): Payer: BC Managed Care – PPO

## 2014-03-02 ENCOUNTER — Ambulatory Visit (INDEPENDENT_AMBULATORY_CARE_PROVIDER_SITE_OTHER): Payer: BC Managed Care – PPO | Admitting: Podiatry

## 2014-03-02 VITALS — BP 134/68 | HR 65 | Resp 16

## 2014-03-02 DIAGNOSIS — E119 Type 2 diabetes mellitus without complications: Secondary | ICD-10-CM

## 2014-03-02 DIAGNOSIS — L600 Ingrowing nail: Secondary | ICD-10-CM

## 2014-03-02 DIAGNOSIS — M779 Enthesopathy, unspecified: Secondary | ICD-10-CM

## 2014-03-02 DIAGNOSIS — M2021 Hallux rigidus, right foot: Secondary | ICD-10-CM

## 2014-03-02 MED ORDER — TRIAMCINOLONE ACETONIDE 10 MG/ML IJ SUSP
10.0000 mg | Freq: Once | INTRAMUSCULAR | Status: AC
Start: 2014-03-02 — End: 2014-03-02
  Administered 2014-03-02: 10 mg

## 2014-03-02 NOTE — Progress Notes (Signed)
Subjective:     Patient ID: Maria Hammond, female   DOB: 01-05-1961, 53 y.o.   MRN: [id number]  HPIpatient states that she traumatized her right big toe several years ago and has never felt right with pain that she gets mild discomfort on the top of both her feet and that she is a long-term diabetic who right now is under reasonably good control but she does experience obesity as a complicating factor   Review of Systems  All other systems reviewed and are negative.      Objective:   Physical Exam  Constitutional: She is oriented to person, place, and time.  Cardiovascular: Intact distal pulses.   Musculoskeletal: Normal range of motion.  Neurological: She is oriented to person, place, and time.  Skin: Skin is warm.  Nursing note and vitals reviewed. neurovascular status intact with muscle strength adequate range of motion of the subtalar and midtarsal joint within normal limits. Patient is noted to have an inflamed right big toe joint that is sore when pressed with a obvious bone spur on the dorsal surface. Patient is noted to have some thickness of the underlying nailbeds and very mild discomfort on the dorsum of the left over right foot. Patient's digits are well-perfused well oriented 3 and I did note that there was a diminishment of 2. 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

## 2014-03-02 NOTE — Progress Notes (Signed)
   Subjective:    Patient ID: Maria Hammond, female    DOB: Jul 02, 1960, 53 y.o.   MRN: [id number]  HPI Comments: "I just want to get my feet checked"  Patient c/o tenderness bilateral feet for several months. Her toes on the left get numb. Some pain anterior ankle, left over right. She thinks she may have broke 1st toe right years ago and "never healed right". She is diabetic. Her last A1C was 7.6.  Diabetes      Review of Systems  All other systems reviewed and are negative.      Objective:   Physical Exam        Assessment & Plan:

## 2014-03-02 NOTE — Patient Instructions (Signed)
Diabetes and Foot Care Diabetes may cause you to have problems because of poor blood supply (circulation) to your feet and legs. This may cause the skin on your feet to become thinner, break easier, and heal more slowly. Your skin may become dry, and the skin may peel and crack. You may also have nerve damage in your legs and feet causing decreased feeling in them. You may not notice minor injuries to your feet that could lead to infections or more serious problems. Taking care of your feet is one of the most important things you can do for yourself.  HOME CARE INSTRUCTIONS  Wear shoes at all times, even in the house. Do not go barefoot. Bare feet are easily injured.  Check your feet daily for blisters, cuts, and redness. If you cannot see the bottom of your feet, use a mirror or ask someone for help.  Wash your feet with warm water (do not use hot water) and mild soap. Then pat your feet and the areas between your toes until they are completely dry. Do not soak your feet as this can dry your skin.  Apply a moisturizing lotion or petroleum jelly (that does not contain alcohol and is unscented) to the skin on your feet and to dry, brittle toenails. Do not apply lotion between your toes.  Trim your toenails straight across. Do not dig under them or around the cuticle. File the edges of your nails with an emery board or nail file.  Do not cut corns or calluses or try to remove them with medicine.  Wear clean socks or stockings every day. Make sure they are not too tight. Do not wear knee-high stockings since they may decrease blood flow to your legs.  Wear shoes that fit properly and have enough cushioning. To break in new shoes, wear them for just a few hours a day. This prevents you from injuring your feet. Always look in your shoes before you put them on to be sure there are no objects inside.  Do not cross your legs. This may decrease the blood flow to your feet.  If you find a minor scrape,  cut, or break in the skin on your feet, keep it and the skin around it clean and dry. These areas may be cleansed with mild soap and water. Do not cleanse the area with peroxide, alcohol, or iodine.  When you remove an adhesive bandage, be sure not to damage the skin around it.  If you have a wound, look at it several times a day to make sure it is healing.  Do not use heating pads or hot water bottles. They may burn your skin. If you have lost feeling in your feet or legs, you may not know it is happening until it is too late.  Make sure your health care provider performs a complete foot exam at least annually or more often if you have foot problems. Report any cuts, sores, or bruises to your health care provider immediately. SEEK MEDICAL CARE IF:   You have an injury that is not healing.  You have cuts or breaks in the skin.  You have an ingrown nail.  You notice redness on your legs or feet.  You feel burning or tingling in your legs or feet.  You have pain or cramps in your legs and feet.  Your legs or feet are numb.  Your feet always feel cold. SEEK IMMEDIATE MEDICAL CARE IF:   There is increasing redness,   swelling, or pain in or around a wound.  There is a red line that goes up your leg.  Pus is coming from a wound.  You develop a fever or as directed by your health care provider.  You notice a bad smell coming from an ulcer or wound. Document Released: 04/07/2000 Document Revised: 12/11/2012 Document Reviewed: 09/17/2012 Exit[employer] Patient Information 2015 [employer]. This information is not intended to replace advice given to you by your health care provider. Make sure you discuss any questions you have with your health care provider.  

## 2014-04-23 ENCOUNTER — Other Ambulatory Visit: Payer: Self-pay | Admitting: Internal Medicine

## 2014-05-12 ENCOUNTER — Other Ambulatory Visit: Payer: Self-pay | Admitting: Internal Medicine

## 2014-05-20 ENCOUNTER — Other Ambulatory Visit: Payer: Self-pay | Admitting: Internal Medicine

## 2014-05-25 ENCOUNTER — Encounter: Payer: Self-pay | Admitting: Internal Medicine

## 2014-05-25 ENCOUNTER — Ambulatory Visit (INDEPENDENT_AMBULATORY_CARE_PROVIDER_SITE_OTHER): Payer: BLUE CROSS/BLUE SHIELD | Admitting: Internal Medicine

## 2014-05-25 VITALS — BP 118/60 | HR 82 | Temp 98.3°F | Resp 12 | Wt 205.0 lb

## 2014-05-25 DIAGNOSIS — E119 Type 2 diabetes mellitus without complications: Secondary | ICD-10-CM

## 2014-05-25 LAB — HEMOGLOBIN A1C: Hgb A1c MFr Bld: 8 % — ABNORMAL HIGH (ref 4.6–6.5)

## 2014-05-25 NOTE — Progress Notes (Signed)
Patient ID: Maria Hammond, female   DOB: [birth year], 54 y.o.   MRN: [id number]  HPI: Maria Hammond is a 54 y.o.-year-old female, returning for f/u for DM2, dx 2008, non-insulin-dependent, uncontrolled, without complications. Last visit 3 mo ago.  Last hemoglobin A1c was: Lab Results  Component Value Date   HGBA1C 7.6* 02/23/2014   HGBA1C 8.6* 11/24/2013   HGBA1C 8.0* 08/13/2013   Pt is on a regimen of: - Metformin 1000 mg po bid  - Glipizide XL 5 mg in am She could not afford Januvia and Tradjenta We stopped Amaryl 2 mg in 03/2013 as her sugars were at goal and even had some lows and got lightheaded 4 h after b'fast, while at work (resolved after having a snack).   Pt check her sugars 1-2 x a day - higher over the Holiday: - am 116-162 (187 x 1) >> 108-152, mostly <140 >> 65, 97-130 >>82,  96-140, 150, 182 - 2h after b'fast (Sun and Thu when she is off): 76-161 >> n/c  - lunch - 11 am: 96-138 >> n/c >> 167 >> 89 >> n/c - 2h after lunch: 84-150 >> n/c >> n/c >> 99-144 >> n/c >> 114, 124 - dinner - 3 pm: 70-80 >> 62, 96-134 (222 x 1) >> 131 >> 70, 90-120 >> 89, 92 - 2h after dinner: 99-72 >> 140-150 >> n/c - bedtime; 95, 132 >> n/c Lowest: 96 ; she has hypoglycemia awareness - feels if she delays a meal: headache. Highest sugars: 194  Meter: Relion.   Pt's meals are (reviewed from last visit): - Breakfast (4 am): 4 strips [place] bacon + 1 egg, 1 hash brown, and 1 bisquit + coffee - splenda and cream - snack: cheese PB crackers - Lunch: PB+J or chicken sandwich + banana + trail mix + bag of chips - snack: none or PB crackers - Dinner: fried chicken + rolls; spaghetti and french toast - varies  - no CKD, last BUN/creatinine:  Lab Results  Component Value Date   BUN 10 08/13/2013   CREATININE 0.5 08/13/2013  On lisinopril. - last set of lipids: Lab Results  Component Value Date   CHOL 142 08/13/2013   HDL 58.30 08/13/2013   LDLCALC 72 08/13/2013   LDLDIRECT 139.9  01/12/2011   TRIG 58.0 08/13/2013   CHOLHDL 2 08/13/2013  On Lipitor. - last eye exam was in 06/2013. No DR. Dr Ricki Miller. - no numbness and tingling in her feet. She sees a podiatrist.  ROS: Constitutional: no weight gain/loss, no fatigue, no subjective hyperthermia/hypothermia Eyes: no blurry vision, no xerophthalmia ENT: no sore throat, no nodules palpated in throat, no dysphagia/odynophagia, no hoarseness Cardiovascular: no CP/SOB/no palpitations/leg swelling Respiratory: no cough/SOB Gastrointestinal: no N/V/D/C Musculoskeletal: no muscle aches/ joint aches Skin: no rashes Neurological: no tremors/numbness/tingling/dizziness  I reviewed pt's medications, allergies, PMH, social hx, family hx and no changes required, except as mentioned above.  PE: BP 118/60 mmHg  Pulse 82  Temp(Src) 98.3 F (36.8 C) (Oral)  Resp 12  Wt 205 lb (92.987 kg)  SpO2 97% Body mass index is 35.74 kg/(m^2).  Wt Readings from Last 3 Encounters:  05/25/14 205 lb (92.987 kg)  02/23/14 204 lb (92.534 kg)  11/24/13 203 lb (92.08 kg)   Constitutional: overweight, in NAD Eyes: PERRLA, EOMI, no exophthalmos ENT: moist mucous membranes, no thyromegaly, no cervical lymphadenopathy Cardiovascular: RRR, No MRG Respiratory: CTA B Gastrointestinal: abdomen soft, NT, ND, BS+ Musculoskeletal: no deformities, strength intact in all 4 Skin:  moist, warm, no rashes  ASSESSMENT: 1. DM2, non-insulin-dependent, uncontrolled, without complications  PLAN:  1. Patient with uncontrolled diabetes, now with good control (slightly higher sugars over the Holidays), on Metformin + glipizide XL.  - I suggested to:  Patient Instructions  Please continue Metformin 1000 mg in am and 1000 mg in pm. Continue Glipizide XL 5 mg in am Please return in 3 months with your sugar log. Try to check sugars later in the day, too. Please stop at the lab. - We can add Invokana if needed in the future, but not needed quite now -  she is up to date with eye exams - will check A1c today - refused the flu vaccine  - Return to clinic in 3 mo with sugar log   Office Visit on 05/25/2014  Component Date Value Ref Range Status  . Hgb A1c MFr Bld 05/25/2014 8.0* 4.6 - 6.5 % Final   Glycemic Control Guidelines for People with Diabetes:Non Diabetic:  <6%Goal of Therapy: <7%Additional Action Suggested:  >8%   hemoglobin A1c higher after the holidays.

## 2014-05-25 NOTE — Patient Instructions (Signed)
Please continue Metformin 1000 mg in am and 1000 mg in pm. Continue Glipizide XL 5 mg in am Please return in 3 months with your sugar log. Try to check sugars later in the day, too. Please stop at the lab.

## 2014-06-16 ENCOUNTER — Other Ambulatory Visit: Payer: Self-pay | Admitting: Internal Medicine

## 2014-07-11 ENCOUNTER — Other Ambulatory Visit: Payer: Self-pay | Admitting: Internal Medicine

## 2014-07-13 ENCOUNTER — Ambulatory Visit (INDEPENDENT_AMBULATORY_CARE_PROVIDER_SITE_OTHER): Payer: BLUE CROSS/BLUE SHIELD | Admitting: Family Medicine

## 2014-07-13 ENCOUNTER — Encounter: Payer: Self-pay | Admitting: Family Medicine

## 2014-07-13 VITALS — BP 100/52 | HR 73 | Temp 98.4°F | Wt 200.0 lb

## 2014-07-13 DIAGNOSIS — Z7251 High risk heterosexual behavior: Secondary | ICD-10-CM

## 2014-07-13 DIAGNOSIS — E785 Hyperlipidemia, unspecified: Secondary | ICD-10-CM

## 2014-07-13 DIAGNOSIS — I10 Essential (primary) hypertension: Secondary | ICD-10-CM

## 2014-07-13 DIAGNOSIS — E034 Atrophy of thyroid (acquired): Secondary | ICD-10-CM

## 2014-07-13 DIAGNOSIS — E669 Obesity, unspecified: Secondary | ICD-10-CM

## 2014-07-13 DIAGNOSIS — E119 Type 2 diabetes mellitus without complications: Secondary | ICD-10-CM

## 2014-07-13 DIAGNOSIS — E038 Other specified hypothyroidism: Secondary | ICD-10-CM

## 2014-07-13 LAB — COMPREHENSIVE METABOLIC PANEL
ALT: 15 U/L (ref 0–35)
AST: 14 U/L (ref 0–37)
Albumin: 3.9 g/dL (ref 3.5–5.2)
Alkaline Phosphatase: 79 U/L (ref 39–117)
BUN: 11 mg/dL (ref 6–23)
CALCIUM: 9.9 mg/dL (ref 8.4–10.5)
CHLORIDE: 105 meq/L (ref 96–112)
CO2: 26 mEq/L (ref 19–32)
Creatinine, Ser: 0.62 mg/dL (ref 0.40–1.20)
GFR: 128.09 mL/min (ref >60.00)
Glucose, Bld: 60 mg/dL — ABNORMAL LOW (ref 70–99)
Potassium: 4.1 mEq/L (ref 3.5–5.1)
Sodium: 139 mEq/L (ref 135–145)
Total Bilirubin: 0.3 mg/dL (ref 0.2–1.2)
Total Protein: 7.7 g/dL (ref 6.0–8.3)

## 2014-07-13 LAB — CBC
HCT: 40.4 % (ref 36.0–46.0)
HEMOGLOBIN: 13.4 g/dL (ref 12.0–15.0)
MCHC: 33 g/dL (ref 30.0–36.0)
MCV: 84.7 fl (ref 78.0–100.0)
PLATELETS: 282 10*3/uL (ref 150.0–400.0)
RBC: 4.77 Mil/uL (ref 3.87–5.11)
RDW: 14.1 % (ref 11.5–15.5)
WBC: 10.5 10*3/uL (ref 4.0–10.5)

## 2014-07-13 LAB — LDL CHOLESTEROL, DIRECT: Direct LDL: 66 mg/dL

## 2014-07-13 LAB — TSH: TSH: 0.73 u[IU]/mL (ref 0.35–4.50)

## 2014-07-13 NOTE — Progress Notes (Signed)
Garret Reddish, MD Phone: [phone]  Subjective:  Patient presents today to establish care with me as their new primary care provider. Patient was formerly a patient of Dr. Leanne Chang. Chief complaint-noted.   See problem oriented charting ROS- No hair or nail changes. No heat/cold intolerance. No constipation or diarrhea. Denies shakiness or anxiety. Denies Polyuria,Polydipsia, nocturia, feet or hand numbness/pain/tingling. Denies Hypoglycemia symptoms (shaky, sweaty, hungry, weak anxious, tremor, palpitations, confusion, behavior change). Does have some dry eyes and reports some eye irritation but time for DM eye exam- encouraged to follow up with optho. no chest pain or shortness of breath. No myalgias  The following were reviewed and entered/updated in epic: Past Medical History  Diagnosis Date  . DIABETES MELLITUS, TYPE II 11/13/2006  . HYPERLIPIDEMIA 11/13/2006  . HYPERTENSION 05/19/2007  . HYPOTHYROIDISM 11/13/2006  . TOBACCO USE 12/16/2007    Quit 04/23/10     Patient Active Problem List   Diagnosis Date Noted  . Type 2 diabetes mellitus not at goal 11/13/2006    Priority: High  . Essential hypertension 05/19/2007    Priority: Medium  . Hypothyroidism 11/13/2006    Priority: Medium  . Hyperlipidemia 11/13/2006    Priority: Medium  . Obesity 01/26/2012    Priority: Low   Past Surgical History  Procedure Laterality Date  . Cesarean section    . Tonsillectomy  1970  . Trigger finger release      around 2012    Family History  Problem Relation Age of Onset  . Diabetes Mother   . Breast cancer Sister     age 75  . Diabetes Brother   . Diabetes Brother   . Heart disease Father   . Kidney disease Father   . Prostate cancer Father     Medications- reviewed and updated Current Outpatient Prescriptions  Medication Sig Dispense Refill  . atorvastatin (LIPITOR) 10 MG tablet TAKE ONE TABLET BY MOUTH ONCE DAILY 30 tablet 3  . glipiZIDE (GLUCOTROL XL) 5 MG 24 hr tablet  TAKE ONE TABLET BY MOUTH IN THE MORNING BEFORE BREAKFAST 30 tablet 0  . levothyroxine (SYNTHROID, LEVOTHROID) 75 MCG tablet TAKE ONE TABLET BY MOUTH ONCE DAILY 90 tablet 3  . lisinopril (PRINIVIL,ZESTRIL) 5 MG tablet TAKE ONE TABLET BY MOUTH ONCE DAILY 90 tablet 3  . metFORMIN (GLUCOPHAGE) 1000 MG tablet TAKE ONE TABLET BY MOUTH TWICE DAILY WITH MEALS 180 tablet 0   No current facility-administered medications for this visit.    Allergies-reviewed and updated No Known Allergies  History   Social History  . Marital Status: Married    Spouse Name: N/A  . Number of Children: N/A  . Years of Education: N/A   Social History Main Topics  . Smoking status: Former Smoker -- 1.00 packs/day for 30 years    Types: Cigarettes    Quit date: 04/23/2010  . Smokeless tobacco: Not on file  . Alcohol Use: No  . Drug Use: No  . Sexual Activity:    Partners: Male   Other Topics Concern  . None   Social History Narrative   In process of divorce (starting 2015). 2 children- boy 2 autism goes to [facility] and works and girl 64 works as Presenter, broadcasting in 2016. Son lives with her, husband still living with her. Daughter in [place].       Works at [employer]- 22 years in May 2016.       Hobbies: tv, facebook/internet. Church.       Regular exercise: not at  this time   Caffeine use: Diet Mt Dew    ROS--See HPI   Objective: BP 100/52 mmHg  Pulse 73  Temp(Src) 98.4 F (36.9 C)  Wt 200 lb (90.719 kg) Gen: NAD, resting comfortably in chair HEENT: Mucous membranes are moist. Oropharynx normal Neck: no thyromegaly CV: RRR no murmurs rubs or gallops Lungs: CTAB no crackles, wheeze, rhonchi Abdomen: soft/nontender/nondistended/normal bowel sounds. No rebound or guarding.  Ext: no edema Skin: warm, dry, no rash  Diabetic Foot Exam - Simple   Simple Foot Form  Diabetic Foot exam was performed with the following findings:  Yes 07/13/2014 10:38 AM  Visual Inspection  No deformities, no ulcerations, no  other skin breakdown bilaterally:  Yes  Sensation Testing  Intact to touch and monofilament testing bilaterally:  Yes  Pulse Check  Posterior Tibialis and Dorsalis pulse intact bilaterally:  Yes  Comments       Assessment/Plan:  Hypothyroidism S: controlled on last check 2014. Appears stable clinically.  A/P: continue levothyroxine 71mcg. Check tsh today. Appears controlled.     Type 2 diabetes mellitus not at goal S: mild poor control, ideally would be <7. Compliant with medications as below. Managed primarily by endocrine but needs foot exam A/P: Foot exam normal today. In addition, patient is not exercising and I strongly reinforced role of exercise in diabetes. Ideally 150 minutes a week but pushed for at least 30 mins 3x a week.     Hyperlipidemia S: controlled on atorvastatin with last LDL <72.  A/P: check direct LDL today. Continue atorvastatin 10mg .     Essential hypertension S: controlled on lisinopril 5mg . Does occasionally get lightheaded with position changes at work A/P: BP looks great. Could consider cutting to 2.5mg . On other hand drinking sodas instead of water so encouraged h20 instead of soda and increased water intake in AM when she has the lightheadedness (usually AM). No falls. If lightheadedness continues would decrease to 2.5mg . Also good for renal protection with DM.     Obesity Encouraged exercise/diet changes/weight loss.    6 month f/u as long as seeing endocrine. As only on orals and not complex regimen, I am also agreeable to resume diabetic care. Make sure got eye exam.   Orders Placed This Encounter  Procedures  . CBC    [place]  . Comprehensive metabolic panel    [place]  . LDL cholesterol, direct    [place]  . TSH       Health Maintenance Due  Topic Date Due  . HIV Screening - needs to be added to next labs, ordered too late today 08/11/1973  . OPHTHALMOLOGY EXAM - advised to schedule today, make sure we have a copy next  visit 07/09/2014

## 2014-07-13 NOTE — Assessment & Plan Note (Signed)
Encouraged exercise/diet changes/weight loss.

## 2014-07-13 NOTE — Patient Instructions (Addendum)
Overall things look great today. I would love to see you exercising at least 150 minutes a week though (or at least 30 minutes 3x a week at a minimum).   Would encourage swapping out at least every other diet soda with a water.   Update labs-we will call or mychart message with results  I would check in with your eye doctor about the irritation you are experiencing despite the eye drops   You declined lung cancer screening.

## 2014-07-13 NOTE — Assessment & Plan Note (Signed)
S: mild poor control, ideally would be <7. Compliant with medications as below. Managed primarily by endocrine but needs foot exam A/P: Foot exam normal today. In addition, patient is not exercising and I strongly reinforced role of exercise in diabetes. Ideally 150 minutes a week but pushed for at least 30 mins 3x a week.

## 2014-07-13 NOTE — Assessment & Plan Note (Signed)
S: controlled on last check 2014. Appears stable clinically.  A/P: continue levothyroxine 1mcg. Check tsh today. Appears controlled.

## 2014-07-13 NOTE — Assessment & Plan Note (Signed)
S: controlled on atorvastatin with last LDL <72.  A/P: check direct LDL today. Continue atorvastatin 10mg .

## 2014-07-13 NOTE — Assessment & Plan Note (Signed)
S: controlled on lisinopril 5mg . Does occasionally get lightheaded with position changes at work A/P: BP looks great. Could consider cutting to 2.5mg . On other hand drinking sodas instead of water so encouraged h20 instead of soda and increased water intake in AM when she has the lightheadedness (usually AM). No falls. If lightheadedness continues would decrease to 2.5mg . Also good for renal protection with DM.

## 2014-08-03 ENCOUNTER — Other Ambulatory Visit: Payer: Self-pay

## 2014-08-03 DIAGNOSIS — Z1231 Encounter for screening mammogram for malignant neoplasm of breast: Secondary | ICD-10-CM

## 2014-08-06 ENCOUNTER — Other Ambulatory Visit: Payer: Self-pay | Admitting: Internal Medicine

## 2014-08-12 ENCOUNTER — Other Ambulatory Visit: Payer: Self-pay | Admitting: Internal Medicine

## 2014-08-24 ENCOUNTER — Ambulatory Visit (INDEPENDENT_AMBULATORY_CARE_PROVIDER_SITE_OTHER): Payer: BLUE CROSS/BLUE SHIELD | Admitting: Internal Medicine

## 2014-08-24 ENCOUNTER — Encounter: Payer: Self-pay | Admitting: Internal Medicine

## 2014-08-24 VITALS — BP 112/60 | HR 80 | Temp 98.4°F | Resp 12 | Wt 196.0 lb

## 2014-08-24 DIAGNOSIS — E119 Type 2 diabetes mellitus without complications: Secondary | ICD-10-CM

## 2014-08-24 LAB — HEMOGLOBIN A1C: Hgb A1c MFr Bld: 7.2 % — ABNORMAL HIGH (ref 4.6–6.5)

## 2014-08-24 NOTE — Progress Notes (Signed)
Patient ID: Maria Hammond, female   DOB: [birth year], 54 y.o.   MRN: [id number]  HPI: Maria Hammond is a 54 y.o.-year-old female, returning for f/u for DM2, dx 2008, non-insulin-dependent, uncontrolled, without complications. Last visit 3 mo ago.  She lost 9 lbs since last visit!  Last hemoglobin A1c was: Lab Results  Component Value Date   HGBA1C 8.0* 05/25/2014   HGBA1C 7.6* 02/23/2014   HGBA1C 8.6* 11/24/2013   Pt is on a regimen of: - Metformin 1000 mg po bid  - Glipizide XL 5 mg in am She could not afford Januvia and Tradjenta We stopped Amaryl 2 mg in 03/2013 as her sugars were at goal and even had some lows and got lightheaded 4 h after b'fast, while at work (resolved after having a snack).   Pt check her sugars 1-2 x a day - higher over the Holidays, now improved: - am: 108-152, mostly <140 >> 65, 97-130 >> 82,  96-140, 150, 182 >> 91-120, fast food: 140s-150s - 2h after b'fast (Sun and Thu when she is off): 76-161 >> n/c >> 91-123 - lunch - 11 am: 96-138 >> n/c >> 167 >> 89 >> n/c - 2h after lunch: 84-150 >> n/c >> n/c >> 99-144 >> n/c >> 114, 124 >> 81-140 - dinner - 3 pm: 70-80 >> 62, 96-134 (222 x 1) >> 131 >> 70, 90-120 >> 89, 92 >> 71-116 - 2h after dinner: 99-72 >> 140-150 >> n/c >> 143 - bedtime; 95, 132 >> n/c Lowest: 96 >> 71 ; she has hypoglycemia awareness - feels if she delays a meal: headache. Highest sugars: 194 >> 160  Meter: Relion.   Pt's meals are (reviewed from last visit): - Breakfast (4 am): 4 strips [place] bacon + 1 egg, 1 hash brown, and 1 bisquit + coffee - splenda and cream - snack: cheese PB crackers - Lunch: PB+J or chicken sandwich + banana + trail mix + bag of chips - snack: none or PB crackers - Dinner: fried chicken + rolls; spaghetti and french toast - varies  - no CKD, last BUN/creatinine:  Lab Results  Component Value Date   BUN 11 07/13/2014   CREATININE 0.62 07/13/2014  On lisinopril. - last set of lipids: Lab Results   Component Value Date   CHOL 142 08/13/2013   HDL 58.30 08/13/2013   LDLCALC 72 08/13/2013   LDLDIRECT 66.0 07/13/2014   TRIG 58.0 08/13/2013   CHOLHDL 2 08/13/2013  On Lipitor. - last eye exam was in 06/2013. No DR. Dr Ricki Miller (retired) >> will switch to Dr Chrissie Noa (08/31/2014) - no numbness and tingling in her feet. She sees a podiatrist.  ROS: Constitutional: no weight gain/loss, no fatigue, no subjective hyperthermia/hypothermia Eyes: no blurry vision, no xerophthalmia ENT: no sore throat, no nodules palpated in throat, no dysphagia/odynophagia, no hoarseness Cardiovascular: no CP/SOB/no palpitations/leg swelling Respiratory: no cough/SOB Gastrointestinal: no N/V/D/C Musculoskeletal: no muscle aches/ joint aches Skin: no rashes Neurological: no tremors/numbness/tingling/dizziness  I reviewed pt's medications, allergies, PMH, social hx, family hx, and changes were documented in the history of present illness. Otherwise, unchanged from my initial visit note.  PE: BP 112/60 mmHg  Pulse 80  Temp(Src) 98.4 F (36.9 C) (Oral)  Resp 12  Wt 196 lb (88.905 kg)  SpO2 97% Body mass index is 34.17 kg/(m^2).  Wt Readings from Last 3 Encounters:  08/24/14 196 lb (88.905 kg)  07/13/14 200 lb (90.719 kg)  05/25/14 205 lb (92.987 kg)   Constitutional:  overweight, in NAD Eyes: PERRLA, EOMI, no exophthalmos ENT: moist mucous membranes, no thyromegaly, no cervical lymphadenopathy Cardiovascular: RRR, No MRG Respiratory: CTA B Gastrointestinal: abdomen soft, NT, ND, BS+ Musculoskeletal: no deformities, strength intact in all 4 Skin: moist, warm, no rashes  ASSESSMENT: 1. DM2, non-insulin-dependent, uncontrolled, without complications  PLAN:  1. Patient with uncontrolled diabetes, now with good control (after slightly higher sugars over the Holidays). She also lost weight recently! Will continue Metformin + glipizide XL.  - I suggested to:  Patient Instructions  Please  continue Metformin 1000 mg in am and 1000 mg in pm. Continue Glipizide XL 5 mg in am Please return in 3 months with your sugar log. Try to check sugars later in the day, too. Please stop at the lab. - We can add Invokana if needed in the future, but not needed quite now - she is overdue for her eye exams >> will have one soon - will check A1c today - Return to clinic in 3 mo with sugar log   Office Visit on 08/24/2014  Component Date Value Ref Range Status  . Hgb A1c MFr Bld 08/24/2014 7.2* 4.6 - 6.5 % Final   Glycemic Control Guidelines for People with Diabetes:Non Diabetic:  <6%Goal of Therapy: <7%Additional Action Suggested:  >8%    HbA1c better.

## 2014-08-24 NOTE — Patient Instructions (Signed)
Please continue Metformin 1000 mg in am and 1000 mg in pm. Continue Glipizide XL 5 mg in am  Please return in 3 months with your sugar log.  Try to check sugars later in the day, too.  Please stop at the lab.

## 2014-08-31 ENCOUNTER — Ambulatory Visit
Admission: RE | Admit: 2014-08-31 | Discharge: 2014-08-31 | Disposition: A | Discharge status: Home / Self Care | Payer: BLUE CROSS/BLUE SHIELD | Source: Ambulatory Visit

## 2014-08-31 DIAGNOSIS — Z1231 Encounter for screening mammogram for malignant neoplasm of breast: Secondary | ICD-10-CM

## 2014-08-31 LAB — HM DIABETES EYE EXAM

## 2014-09-28 ENCOUNTER — Other Ambulatory Visit: Payer: Self-pay | Admitting: Internal Medicine

## 2014-09-28 ENCOUNTER — Other Ambulatory Visit: Payer: Self-pay | Admitting: Family Medicine

## 2014-10-15 ENCOUNTER — Other Ambulatory Visit: Payer: Self-pay | Admitting: Internal Medicine

## 2014-10-29 ENCOUNTER — Other Ambulatory Visit: Payer: Self-pay | Admitting: Internal Medicine

## 2014-11-23 ENCOUNTER — Other Ambulatory Visit (INDEPENDENT_AMBULATORY_CARE_PROVIDER_SITE_OTHER): Payer: BLUE CROSS/BLUE SHIELD | Admitting: *Deleted

## 2014-11-23 ENCOUNTER — Encounter: Payer: Self-pay | Admitting: Internal Medicine

## 2014-11-23 ENCOUNTER — Ambulatory Visit (INDEPENDENT_AMBULATORY_CARE_PROVIDER_SITE_OTHER): Payer: BLUE CROSS/BLUE SHIELD | Admitting: Internal Medicine

## 2014-11-23 VITALS — BP 114/66 | HR 78 | Temp 98.6°F | Resp 12 | Wt 199.8 lb

## 2014-11-23 DIAGNOSIS — E119 Type 2 diabetes mellitus without complications: Secondary | ICD-10-CM | POA: Diagnosis not present

## 2014-11-23 LAB — POCT GLYCOSYLATED HEMOGLOBIN (HGB A1C): HEMOGLOBIN A1C: 7.1

## 2014-11-23 LAB — HM DIABETES FOOT EXAM: HM DIABETIC FOOT EXAM: NORMAL

## 2014-11-23 NOTE — Progress Notes (Signed)
Patient ID: Maria Hammond, female   DOB: 02/22/1961, 54 y.o.   MRN: [id number]  HPI: Maria Hammond is a 54 y.o.-year-old female, returning for f/u for DM2, dx 2008, non-insulin-dependent, uncontrolled, without complications. Last visit 3 mo ago.  Last hemoglobin A1c was: Lab Results  Component Value Date   HGBA1C 7.2* 08/24/2014   HGBA1C 8.0* 05/25/2014   HGBA1C 7.6* 02/23/2014   Pt is on a regimen of: - Metformin 1000 mg po bid  - Glipizide XL 5 mg in am She could not afford Januvia and Tradjenta We stopped Amaryl 2 mg in 03/2013 as her sugars were at goal and even had some lows and got lightheaded 4 h after b'fast, while at work (resolved after having a snack).   Pt check her sugars 1-2 x a day: - am: 65, 97-130 >> 82,  96-140, 150, 182 >> 91-120, fast food: 140s-150s >> 69, 90-120, 160 - 2h after b'fast (Sun and Thu when she is off): 76-161 >> n/c >> 91-123 >> n/c - lunch - 11 am: 96-138 >> n/c >> 167 >> 89 >> n/c - 2h after lunch: 84-150 >> n/c >> n/c >> 99-144 >> n/c >> 114, 124 >> 81-140 >> n/c - dinner - 3 pm: 70-80 >> 62, 96-134 (222 x 1) >> 131 >> 70, 90-120 >> 89, 92 >> 71-116 >> 90-120 - 2h after dinner: 99-72 >> 140-150 >> n/c >> 143 >> 170 (ave) - bedtime; 95, 132 >> n/c Lowest: 96 >> 71 >> 69 ; she has hypoglycemia awareness. Highest sugars: 194 >> 160 >> 212 (after 2 bowls of frosted flakes).  Meter: Relion.   Pt's meals are (reviewed from last visit): - Breakfast (4 am): 4 strips [place] bacon + 1 egg, 1 hash brown, and 1 bisquit + coffee - splenda and cream - snack: cheese PB crackers - Lunch: PB+J or chicken sandwich + banana + trail mix + bag of chips - snack: none or PB crackers - Dinner: fried chicken + rolls; spaghetti and french toast - varies  She works starting 5 in the morning. She wakes up at 2:30 AM. Goes to bed around 8 p.m.  - no CKD, last BUN/creatinine:  Lab Results  Component Value Date   BUN 11 07/13/2014   CREATININE 0.62 07/13/2014  On  lisinopril. - last set of lipids: Lab Results  Component Value Date   CHOL 142 08/13/2013   HDL 58.30 08/13/2013   LDLCALC 72 08/13/2013   LDLDIRECT 66.0 07/13/2014   TRIG 58.0 08/13/2013   CHOLHDL 2 08/13/2013  On Lipitor. - last eye exam was in Dr Chrissie Noa (08/31/2014). No DR. - no numbness and tingling in her feet. She saw a podiatrist.  ROS: Constitutional: no weight gain/loss, no fatigue, no subjective hyperthermia/hypothermia Eyes: no blurry vision, no xerophthalmia ENT: no sore throat, no nodules palpated in throat, no dysphagia/odynophagia, no hoarseness Cardiovascular: no CP/SOB/no palpitations/leg swelling Respiratory: no cough/SOB Gastrointestinal: no N/V/D/C Musculoskeletal: no muscle aches/ joint aches Skin: no rashes Neurological: no tremors/numbness/tingling/dizziness  I reviewed pt's medications, allergies, PMH, social hx, family hx, and changes were documented in the history of present illness. Otherwise, unchanged from my initial visit note.  PE: BP 114/66 mmHg  Pulse 78  Temp(Src) 98.6 F (37 C) (Oral)  Resp 12  Wt 199 lb 12.8 oz (90.629 kg)  SpO2 95% Body mass index is 34.83 kg/(m^2).  Wt Readings from Last 3 Encounters:  11/23/14 199 lb 12.8 oz (90.629 kg)  08/24/14 196  lb (88.905 kg)  07/13/14 200 lb (90.719 kg)   Constitutional: overweight, in NAD Eyes: PERRLA, EOMI, no exophthalmos ENT: moist mucous membranes, no thyromegaly, no cervical lymphadenopathy Cardiovascular: RRR, No MRG Respiratory: CTA B Gastrointestinal: abdomen soft, NT, ND, BS+ Musculoskeletal: no deformities, strength intact in all 4 Skin: moist, warm, no rashes Foot exam performed today: normal.  ASSESSMENT: 1. DM2, non-insulin-dependent, uncontrolled, without complications  PLAN:  1. Patient with uncontrolled diabetes, now with good control. Will continue Metformin + glipizide XL.  - I suggested to:  Patient Instructions  Please continue Metformin 1000 mg in am and  1000 mg in pm. Continue Glipizide XL 5 mg in am  Please return in 3 months with your sugar log.  - We can add Invokana if needed in the future, but not needed quite now - she is overdue for her eye exams >> had one 08/2014 >> no DR - will check A1c today >> 7.1% (improved!) - Return to clinic in 3 mo with sugar log

## 2014-11-23 NOTE — Patient Instructions (Signed)
Please continue Metformin 1000 mg in am and 1000 mg in pm. Continue Glipizide XL 5 mg in am  Please return in 3 months with your sugar log.

## 2015-01-10 ENCOUNTER — Other Ambulatory Visit: Payer: Self-pay | Admitting: Internal Medicine

## 2015-01-10 ENCOUNTER — Other Ambulatory Visit: Payer: Self-pay | Admitting: Family Medicine

## 2015-01-18 ENCOUNTER — Ambulatory Visit: Payer: BLUE CROSS/BLUE SHIELD | Admitting: Family Medicine

## 2015-01-25 ENCOUNTER — Other Ambulatory Visit: Payer: Self-pay | Admitting: Internal Medicine

## 2015-02-22 ENCOUNTER — Other Ambulatory Visit: Payer: Self-pay | Admitting: Internal Medicine

## 2015-02-26 ENCOUNTER — Other Ambulatory Visit (INDEPENDENT_AMBULATORY_CARE_PROVIDER_SITE_OTHER): Payer: BLUE CROSS/BLUE SHIELD | Admitting: *Deleted

## 2015-02-26 ENCOUNTER — Encounter: Payer: Self-pay | Admitting: Internal Medicine

## 2015-02-26 ENCOUNTER — Ambulatory Visit (INDEPENDENT_AMBULATORY_CARE_PROVIDER_SITE_OTHER): Payer: BLUE CROSS/BLUE SHIELD | Admitting: Internal Medicine

## 2015-02-26 VITALS — BP 132/64 | HR 72 | Temp 98.1°F | Resp 12 | Wt 203.0 lb

## 2015-02-26 DIAGNOSIS — E119 Type 2 diabetes mellitus without complications: Secondary | ICD-10-CM

## 2015-02-26 LAB — POCT GLYCOSYLATED HEMOGLOBIN (HGB A1C): HEMOGLOBIN A1C: 7.4

## 2015-02-26 NOTE — Progress Notes (Signed)
Patient ID: Maria Hammond, female   DOB: [birth year], 54 y.o.   MRN: [id number]  HPI: Maria Hammond is a 54 y.o.-year-old female, returning for f/u for DM2, dx 2008, non-insulin-dependent, uncontrolled, without complications. Last visit 3 mo ago.  Last hemoglobin A1c was: Lab Results  Component Value Date   HGBA1C 7.1 11/23/2014   HGBA1C 7.2* 08/24/2014   HGBA1C 8.0* 05/25/2014   Pt is on a regimen of: - Metformin 1000 mg po bid  - Glipizide XL 5 mg in am She could not afford Januvia and Tradjenta. We stopped Amaryl 2 mg in 03/2013 as her sugars were at goal and even had some lows and got lightheaded 4 h after b'fast, while at work (resolved after having a snack).   Pt check her sugars 1-2 x a day: - am: 65, 97-130 >> 82,  96-140, 150, 182 >> 91-120, fast food: 140s-150s >> 69, 90-120, 160 >> 101-152 - 2h after b'fast (Sun and Thu when she is off): 76-161 >> n/c >> 91-123 >> n/c >> 106, 107 - lunch - 11 am: 96-138 >> n/c >> 167 >> 89 >> n/c  - 2h after lunch: 84-150 >> n/c >> n/c >> 99-144 >> n/c >> 114, 124 >> 81-140 >> n/c >> 92-136, 186 - dinner - 3 pm: 70-80 >> 62, 96-134 (222 x 1) >> 131 >> 70, 90-120 >> 89, 92 >> 71-116 >> 90-120 >> n/c - 2h after dinner: 99-72 >> 140-150 >> n/c >> 143 >> 170 (ave) >> 120-155, 195 - bedtime; 95, 132 >> n/c Lowest: 96 >> 71 >> 69 ; she has hypoglycemia awareness. Highest sugars: 194 >> 160 >> 212 >> 195  Meter: Relion.   Pt's meals are (reviewed from last visit): - Breakfast (4 am): 4 strips [place] bacon + 1 egg, 1 hash brown, and 1 bisquit + coffee - splenda and cream - snack: cheese PB crackers - Lunch: PB+J or chicken sandwich + banana + trail mix + bag of chips - snack: none or PB crackers - Dinner: fried chicken + rolls; spaghetti and french toast - varies  She works starting 5 in the morning. She wakes up at 2:30 AM. Goes to bed around 8 p.m.  - no CKD, last BUN/creatinine:  Lab Results  Component Value Date   BUN 11 07/13/2014   CREATININE 0.62 07/13/2014  On lisinopril. - last set of lipids: Lab Results  Component Value Date   CHOL 142 08/13/2013   HDL 58.30 08/13/2013   LDLCALC 72 08/13/2013   LDLDIRECT 66.0 07/13/2014   TRIG 58.0 08/13/2013   CHOLHDL 2 08/13/2013  On Lipitor. - last eye exam was in Dr Chrissie Noa (08/31/2014). No DR. - no numbness and tingling in her feet. She saw a podiatrist.  She has hypothyroidism: Lab Results  Component Value Date   TSH 0.73 07/13/2014   ROS: Constitutional: no weight gain/loss, no fatigue, no subjective hyperthermia/hypothermia Eyes: no blurry vision, no xerophthalmia ENT: no sore throat, no nodules palpated in throat, no dysphagia/odynophagia, no hoarseness Cardiovascular: no CP/SOB/no palpitations/leg swelling Respiratory: no cough/SOB Gastrointestinal: no N/V/D/C Musculoskeletal: no muscle aches/ joint aches Skin: no rashes Neurological: no tremors/numbness/tingling/dizziness  I reviewed pt's medications, allergies, PMH, social hx, family hx, and changes were documented in the history of present illness. Otherwise, unchanged from my initial visit note.  PE: BP 132/64 mmHg  Pulse 72  Temp(Src) 98.1 F (36.7 C) (Oral)  Resp 12  Wt 203 lb (92.08 kg)  SpO2 95% Body  mass index is 35.39 kg/(m^2).  Wt Readings from Last 3 Encounters:  02/26/15 203 lb (92.08 kg)  11/23/14 199 lb 12.8 oz (90.629 kg)  08/24/14 196 lb (88.905 kg)   Constitutional: overweight, in NAD Eyes: PERRLA, EOMI, no exophthalmos ENT: moist mucous membranes, no thyromegaly, no cervical lymphadenopathy Cardiovascular: RRR, No MRG Respiratory: CTA B Gastrointestinal: abdomen soft, NT, ND, BS+ Musculoskeletal: no deformities, strength intact in all 4 Skin: moist, warm, no rashes Foot exam performed today: normal.  ASSESSMENT: 1. DM2, non-insulin-dependent, uncontrolled, without complications  PLAN:  1. Patient with uncontrolled diabetes, with worsening control since last visit due  to dietary indiscretions. Will continue Metformin + glipizide XL as she wants to try to work on her diet before making changes to her regimen. I did advise her to increase Glipizide XL to 10 mg in am if she plans to eat more in a particular day (during the [place]). - I suggested to:  Patient Instructions  Please continue Metformin 1000 mg in am and 1000 mg in pm. Continue Glipizide XL 5 mg in am. Can increase to 10 mg if needed.  Stay active and pay attention to your diet.  Please return in 3 months with your sugar log.  - We can add Invokana if needed in the future - last eye exam 08/2014 >> no DR - refuses flu shot today - will check A1c today >> 7.4% (higher) - Return to clinic in 3 mo with sugar log

## 2015-02-26 NOTE — Patient Instructions (Addendum)
Please continue Metformin 1000 mg in am and 1000 mg in pm. Continue Glipizide XL 5 mg in am. Can increase to 10 mg if needed.  Stay active and pay attention to your diet.  Please return in 3 months with your sugar log.

## 2015-03-02 ENCOUNTER — Encounter: Payer: BLUE CROSS/BLUE SHIELD | Admitting: Family Medicine

## 2015-03-22 ENCOUNTER — Other Ambulatory Visit: Payer: Self-pay | Admitting: Family Medicine

## 2015-03-22 ENCOUNTER — Other Ambulatory Visit: Payer: Self-pay | Admitting: Internal Medicine

## 2015-04-08 ENCOUNTER — Other Ambulatory Visit: Payer: Self-pay | Admitting: Internal Medicine

## 2015-05-04 ENCOUNTER — Encounter: Payer: BLUE CROSS/BLUE SHIELD | Admitting: Family Medicine

## 2015-05-31 ENCOUNTER — Other Ambulatory Visit (INDEPENDENT_AMBULATORY_CARE_PROVIDER_SITE_OTHER): Payer: BLUE CROSS/BLUE SHIELD | Admitting: *Deleted

## 2015-05-31 ENCOUNTER — Encounter: Payer: Self-pay | Admitting: Internal Medicine

## 2015-05-31 ENCOUNTER — Ambulatory Visit (INDEPENDENT_AMBULATORY_CARE_PROVIDER_SITE_OTHER): Payer: BLUE CROSS/BLUE SHIELD | Admitting: Internal Medicine

## 2015-05-31 VITALS — BP 116/64 | HR 77 | Temp 98.2°F | Resp 12 | Wt 202.0 lb

## 2015-05-31 DIAGNOSIS — E119 Type 2 diabetes mellitus without complications: Secondary | ICD-10-CM

## 2015-05-31 LAB — POCT GLYCOSYLATED HEMOGLOBIN (HGB A1C): Hemoglobin A1C: 7.7

## 2015-05-31 MED ORDER — GLIPIZIDE ER 5 MG PO TB24: ORAL_TABLET | ORAL | Status: DC

## 2015-05-31 MED ORDER — METFORMIN HCL 1000 MG PO TABS: ORAL_TABLET | ORAL | Status: DC

## 2015-05-31 NOTE — Patient Instructions (Signed)
Patient Instructions  Please continue Metformin 1000 mg 2x a day Continue Glipizide XL 5 mg in am. Add 1/2 Glipizide XL 5 mg (2.5 mg) before dinner.  Stay active and pay attention to your diet.  Please return in 3 months with your sugar log.

## 2015-05-31 NOTE — Progress Notes (Signed)
Patient ID: Maria Hammond, female   DOB: [birth year], 55 y.o.   MRN: [id number]  HPI: Maria Hammond is a 55 y.o.-year-old female, returning for f/u for DM2, dx 2008, non-insulin-dependent, uncontrolled, without complications. Last visit 3 mo ago.  Last hemoglobin A1c was: Lab Results  Component Value Date   HGBA1C 7.4 02/26/2015   HGBA1C 7.1 11/23/2014   HGBA1C 7.2* 08/24/2014   Pt is on a regimen of: - Metformin 1000 mg po bid  - Glipizide XL 5 mg in am She could not afford Januvia and Tradjenta. We stopped Amaryl 2 mg in 03/2013 as her sugars were at goal and even had some lows and got lightheaded 4 h after b'fast, while at work (resolved after having a snack).   Pt check her sugars 2 x a day (no log, no meter): - am: 91-120, fast food: 140s-150s >> 69, 90-120, 160 >> 101-152 >> 120-191, average ~150 - 2h after b'fast (Sun and Thu when she is off): 76-161 >> n/c >> 91-123 >> n/c >> 106, 107 >> n/c - lunch - 11 am: 96-138 >> n/c >> 167 >> 89 >> n/c  - 2h after lunch: 99-144 >> n/c >> 114, 124 >> 81-140 >> n/c >> 92-136, 186 >> n/c - dinner - 3 pm: 62, 96-134 (222 x 1) >> 131 >> 70, 90-120 >> 89, 92 >> 71-116 >> 90-120 >> n/c >> 80s - 2h after dinner: 99-72 >> 140-150 >> n/c >> 143 >> 170 (ave) >> 120-155, 195 >> 150-200 - bedtime; 95, 132 >> n/c Lowest: 96 >> 71 >> 69 >> 80; she has hypoglycemia awareness. Highest sugars: 194 >> 160 >> 212 >> 195 >> 212  Meter: Relion.   Pt's meals are (reviewed from last visit): - Breakfast (4 am): 4 strips [place] bacon + 1 egg, 1 hash brown, and 1 bisquit + coffee - splenda and cream - snack: cheese PB crackers - Lunch: PB+J or chicken sandwich + banana + trail mix + bag of chips - snack: none or PB crackers - Dinner: fried chicken + rolls; spaghetti and french toast - varies  She works starting 5 in the morning. She wakes up at 2:30 AM. Goes to bed around 8 p.m.  - no CKD, last BUN/creatinine:  Lab Results  Component Value Date   BUN 11  07/13/2014   CREATININE 0.62 07/13/2014  On lisinopril. - last set of lipids: Lab Results  Component Value Date   CHOL 142 08/13/2013   HDL 58.30 08/13/2013   LDLCALC 72 08/13/2013   LDLDIRECT 66.0 07/13/2014   TRIG 58.0 08/13/2013   CHOLHDL 2 08/13/2013  On Lipitor. - last eye exam was in Dr Chrissie Noa (08/31/2014). No DR. - no numbness and tingling in her feet. She saw a podiatrist.Foot exam performed 02/2015: normal.  She has hypothyroidism: Lab Results  Component Value Date   TSH 0.73 07/13/2014   ROS: Constitutional: no weight gain/loss, + fatigue, no subjective hyperthermia/hypothermia Eyes: no blurry vision, no xerophthalmia ENT: no sore throat, no nodules palpated in throat, no dysphagia/odynophagia, no hoarseness Cardiovascular: no CP/SOB/no palpitations/leg swelling Respiratory: no cough/SOB Gastrointestinal: no N/V/D/C Musculoskeletal: no muscle aches/ joint aches Skin: no rashes Neurological: no tremors/numbness/tingling/dizziness  I reviewed pt's medications, allergies, PMH, social hx, family hx, and changes were documented in the history of present illness. Otherwise, unchanged from my initial visit note.  PE: BP 116/64 mmHg  Pulse 77  Temp(Src) 98.2 F (36.8 C) (Oral)  Resp 12  Wt 202  lb (91.627 kg)  SpO2 96% Body mass index is 35.22 kg/(m^2).  Wt Readings from Last 3 Encounters:  05/31/15 202 lb (91.627 kg)  02/26/15 203 lb (92.08 kg)  11/23/14 199 lb 12.8 oz (90.629 kg)   Constitutional: overweight, in NAD Eyes: PERRLA, EOMI, no exophthalmos ENT: moist mucous membranes, no thyromegaly, no cervical lymphadenopathy Cardiovascular: RRR, No MRG Respiratory: CTA B Gastrointestinal: abdomen soft, NT, ND, BS+ Musculoskeletal: no deformities, strength intact in all 4 Skin: moist, warm, no rashes  ASSESSMENT: 1. DM2, non-insulin-dependent, uncontrolled, without complications  PLAN:  1. Patient with uncontrolled diabetes, with worsening control since  last visit due to dietary indiscretions over the [place]. Will continue Metformin + glipizide XL in am and, since her worst sugars are at bedtime , will add 1/2 Glipizide tablet before dinner.  - I suggested to:  Patient Instructions  Please continue Metformin 1000 mg 2x a day Continue Glipizide XL 5 mg in am. Add 1/2 Glipizide XL 5 mg (2.5 mg) before dinner.  Stay active and pay attention to your diet.  Please return in 3 months with your sugar log.  - We can add Invokana if needed in the future - last eye exam 08/2014 >> no DR - refused flu shot  - refilled both DM meds - will have Lipid panel by PCP in 1 mo - will check A1c today >> 7.7% (higher) - Return to clinic in 3 mo with sugar log

## 2015-07-05 ENCOUNTER — Other Ambulatory Visit (INDEPENDENT_AMBULATORY_CARE_PROVIDER_SITE_OTHER): Payer: BLUE CROSS/BLUE SHIELD

## 2015-07-05 DIAGNOSIS — R8290 Unspecified abnormal findings in urine: Secondary | ICD-10-CM | POA: Diagnosis not present

## 2015-07-05 DIAGNOSIS — Z0000 Encounter for general adult medical examination without abnormal findings: Secondary | ICD-10-CM

## 2015-07-05 DIAGNOSIS — R319 Hematuria, unspecified: Secondary | ICD-10-CM

## 2015-07-05 LAB — CBC WITH DIFFERENTIAL/PLATELET
BASOS PCT: 0.4 % (ref 0.0–3.0)
Basophils Absolute: 0 10*3/uL (ref 0.0–0.1)
EOS ABS: 0.1 10*3/uL (ref 0.0–0.7)
Eosinophils Relative: 1.5 % (ref 0.0–5.0)
HCT: 38.6 % (ref 36.0–46.0)
Hemoglobin: 12.9 g/dL (ref 12.0–15.0)
LYMPHS PCT: 36.9 % (ref 12.0–46.0)
Lymphs Abs: 2.2 10*3/uL (ref 0.7–4.0)
MCHC: 33.5 g/dL (ref 30.0–36.0)
MCV: 83.6 fl (ref 78.0–100.0)
MONO ABS: 0.6 10*3/uL (ref 0.1–1.0)
Monocytes Relative: 9.7 % (ref 3.0–12.0)
Neutro Abs: 3 10*3/uL (ref 1.4–7.7)
Neutrophils Relative %: 51.5 % (ref 43.0–77.0)
Platelets: 224 10*3/uL (ref 150.0–400.0)
RBC: 4.62 Mil/uL (ref 3.87–5.11)
RDW: 14 % (ref 11.5–15.5)
WBC: 5.9 10*3/uL (ref 4.0–10.5)

## 2015-07-05 LAB — POC URINALSYSI DIPSTICK (AUTOMATED)
BILIRUBIN UA: NEGATIVE
GLUCOSE UA: NEGATIVE
KETONES UA: NEGATIVE
Nitrite, UA: NEGATIVE
SPEC GRAV UA: 1.02
Urobilinogen, UA: 1
pH, UA: 6

## 2015-07-05 LAB — LIPID PANEL
Cholesterol: 138 mg/dL (ref 0–200)
HDL: 40.5 mg/dL (ref >39.00)
LDL CALC: 85 mg/dL (ref 0–99)
NonHDL: 97.1
Total CHOL/HDL Ratio: 3
Triglycerides: 59 mg/dL (ref 0.0–149.0)
VLDL: 11.8 mg/dL (ref 0.0–40.0)

## 2015-07-05 LAB — HEPATIC FUNCTION PANEL
ALT: 20 U/L (ref 0–35)
AST: 16 U/L (ref 0–37)
Albumin: 4.1 g/dL (ref 3.5–5.2)
Alkaline Phosphatase: 71 U/L (ref 39–117)
BILIRUBIN DIRECT: 0.1 mg/dL (ref 0.0–0.3)
BILIRUBIN TOTAL: 0.5 mg/dL (ref 0.2–1.2)
TOTAL PROTEIN: 7.9 g/dL (ref 6.0–8.3)

## 2015-07-05 LAB — URINALYSIS, ROUTINE W REFLEX MICROSCOPIC
BILIRUBIN URINE: NEGATIVE
HGB URINE DIPSTICK: NEGATIVE
Ketones, ur: NEGATIVE
NITRITE: NEGATIVE
PH: 6 (ref 5.0–8.0)
RBC / HPF: NONE SEEN (ref 0–?)
Specific Gravity, Urine: 1.02 (ref 1.000–1.030)
Urine Glucose: NEGATIVE
Urobilinogen, UA: 0.2 (ref 0.0–1.0)

## 2015-07-05 LAB — BASIC METABOLIC PANEL
BUN: 10 mg/dL (ref 6–23)
CHLORIDE: 101 meq/L (ref 96–112)
CO2: 27 mEq/L (ref 19–32)
Calcium: 9.3 mg/dL (ref 8.4–10.5)
Creatinine, Ser: 0.59 mg/dL (ref 0.40–1.20)
GFR: 135.16 mL/min (ref >60.00)
Glucose, Bld: 139 mg/dL — ABNORMAL HIGH (ref 70–99)
POTASSIUM: 4 meq/L (ref 3.5–5.1)
Sodium: 137 mEq/L (ref 135–145)

## 2015-07-05 LAB — MICROALBUMIN / CREATININE URINE RATIO
Creatinine,U: 181.3 mg/dL
Microalb Creat Ratio: 3 mg/g (ref 0.0–30.0)
Microalb, Ur: 5.4 mg/dL — ABNORMAL HIGH (ref 0.0–1.9)

## 2015-07-05 LAB — HEMOGLOBIN A1C: Hgb A1c MFr Bld: 8.7 % — ABNORMAL HIGH (ref 4.6–6.5)

## 2015-07-05 LAB — TSH: TSH: 0.77 u[IU]/mL (ref 0.35–4.50)

## 2015-07-06 ENCOUNTER — Other Ambulatory Visit: Payer: Self-pay | Admitting: Family Medicine

## 2015-07-12 ENCOUNTER — Other Ambulatory Visit (HOSPITAL_COMMUNITY)
Admission: RE | Admit: 2015-07-12 | Discharge: 2015-07-12 | Disposition: A | Discharge status: Home / Self Care | Payer: BLUE CROSS/BLUE SHIELD | Source: Ambulatory Visit | Attending: Family Medicine | Admitting: Family Medicine

## 2015-07-12 ENCOUNTER — Ambulatory Visit (INDEPENDENT_AMBULATORY_CARE_PROVIDER_SITE_OTHER): Payer: BLUE CROSS/BLUE SHIELD | Admitting: Family Medicine

## 2015-07-12 ENCOUNTER — Encounter: Payer: Self-pay | Admitting: Family Medicine

## 2015-07-12 VITALS — BP 114/64 | HR 85 | Temp 98.6°F | Ht 63.0 in | Wt 200.0 lb

## 2015-07-12 DIAGNOSIS — M6530 Trigger finger, unspecified finger: Secondary | ICD-10-CM

## 2015-07-12 DIAGNOSIS — Z0001 Encounter for general adult medical examination with abnormal findings: Secondary | ICD-10-CM | POA: Diagnosis not present

## 2015-07-12 DIAGNOSIS — R6889 Other general symptoms and signs: Secondary | ICD-10-CM | POA: Diagnosis not present

## 2015-07-12 DIAGNOSIS — Z01419 Encounter for gynecological examination (general) (routine) without abnormal findings: Secondary | ICD-10-CM | POA: Diagnosis not present

## 2015-07-12 NOTE — Patient Instructions (Signed)
We will call you within a week about your referral to hand surgery. If you do not hear within 2 weeks, give [place] a call.   No changes to medicines  Diabetes is worsening- glad you have turned this around some. Would consider cutting down on diet soda, adding regular exercise, continuing to work on changing your food choices for the better

## 2015-07-12 NOTE — Progress Notes (Signed)
Garret Reddish, MD Phone: [phone]  Subjective:  Patient presents today for their annual physical. Chief complaint-noted.   See problem oriented charting- ROS- full  review of systems was completed and negative except for: she does have some chronic right hand pain in finger she had prior trigger finger release. Pertinent negatives No chest pain or shortness of breath. No headache or blurry vision.   The following were reviewed and entered/updated in epic: Past Medical History  Diagnosis Date  . DIABETES MELLITUS, TYPE II 11/13/2006  . HYPERLIPIDEMIA 11/13/2006  . HYPERTENSION 05/19/2007  . HYPOTHYROIDISM 11/13/2006  . TOBACCO USE 12/16/2007    Quit 04/23/10     Patient Active Problem List   Diagnosis Date Noted  . Type 2 diabetes mellitus not at goal [facility]) 11/13/2006    Priority: High  . Essential hypertension 05/19/2007    Priority: Medium  . Hypothyroidism 11/13/2006    Priority: Medium  . Hyperlipidemia 11/13/2006    Priority: Medium  . Obesity 01/26/2012    Priority: Low   Past Surgical History  Procedure Laterality Date  . Cesarean section    . Tonsillectomy  1970  . Trigger finger release      around 2012    Family History  Problem Relation Age of Onset  . Diabetes Mother   . Breast cancer Sister     age 28  . Diabetes Brother   . Diabetes Brother   . Heart disease Father   . Kidney disease Father   . Prostate cancer Father     Medications- reviewed and updated Current Outpatient Prescriptions  Medication Sig Dispense Refill  . atorvastatin (LIPITOR) 10 MG tablet TAKE ONE TABLET BY MOUTH ONCE DAILY 30 tablet 6  . glipiZIDE (GLUCOTROL XL) 5 MG 24 hr tablet TAKE ONE TABLET BY MOUTH IN THE MORNING BEFORE BREAKFAST and 1/2 tablet before dinner 135 tablet 1  . levothyroxine (SYNTHROID, LEVOTHROID) 75 MCG tablet TAKE ONE TABLET BY MOUTH ONCE DAILY 90 tablet 3  . lisinopril (PRINIVIL,ZESTRIL) 5 MG tablet TAKE ONE TABLET BY MOUTH ONCE DAILY 90 tablet 3  .  metFORMIN (GLUCOPHAGE) 1000 MG tablet TAKE ONE TABLET BY MOUTH TWICE DAILY WITH  MEALS 180 tablet 1   No current facility-administered medications for this visit.    Allergies-reviewed and updated No Known Allergies  Social History   Social History  . Marital Status: Married    Spouse Name: N/A  . Number of Children: N/A  . Years of Education: N/A   Social History Main Topics  . Smoking status: Former Smoker -- 1.00 packs/day for 30 years    Types: Cigarettes    Quit date: 04/23/2010  . Smokeless tobacco: None  . Alcohol Use: No  . Drug Use: No  . Sexual Activity:    Partners: Male   Other Topics Concern  . None   Social History Narrative   In process of divorce (starting 42- still going 2017). 2 children- boy 56 autism goes to [facility] and works and girl 39 works at the jail in 2016. Son lives with her.  Husband no longer in house. Daughter in [place].       Works at [employer]- 24 years in May 2016.       Hobbies: tv, facebook/internet. Church.       Regular exercise: not at this time   Caffeine use: Diet Mt Dew    ROS--See HPI   Objective: BP 114/64 mmHg  Pulse 85  Temp(Src) 98.6 F (37 C)  Ht 5\' 3"  (1.6 m)  Wt 200 lb (90.719 kg)  BMI 35.44 kg/m2 Gen: NAD, resting comfortably HEENT: Mucous membranes are moist. Oropharynx normal Neck: no thyromegaly CV: RRR no murmurs rubs or gallops Lungs: CTAB no crackles, wheeze, rhonchi Abdomen: soft/nontender/nondistended/normal bowel sounds. No rebound or guarding. Obese.  Ext: no edema Skin: warm, dry Neuro: grossly normal, moves all extremities, PERRLA  Breasts: does at breast center yearly Pelvic: cervix normal in appearance, external genitalia normal, no adnexal masses or tenderness, no cervical motion tenderness, uterus normal size, shape, and consistency and vagina normal without discharge   Assessment/Plan:  55 y.o. female presenting for annual physical.  Health Maintenance counseling: 1. Anticipatory  guidance: Patient counseled regarding regular dental exams, eye exams, wearing seatbelts.  2. Risk factor reduction:  Advised patient of need for regular exercise (not on regular basis-plans to work on it) and diet rich and fruits and vegetables to reduce risk of heart attack and stroke. Has improved diet since a1c.  3. Immunizations/screenings/ancillary studies Health Maintenance Due  Topic Date Due  . Hepatitis C Screening - next bloodwork Apr 03, 1961  . HIV Screening - next bloodwork 08/11/1973  . PAP SMEAR - screen today 06/11/2015   4. Cervical cancer screening- screen today 5. Breast cancer screening-  breast exam through breast center and mammogram 08/31/14 6. Colon cancer screening - 10/22/2008 with 10 year follow up  Trigger finger, right (prior surgery- has developed pain as well as weakness in middle finger) - Plan: Ambulatory referral to Hand Surgery for follow up  Return in about 1 year (around 07/11/2016) for physical. Return precautions advised.   Orders Placed This Encounter  Procedures  . Ambulatory referral to Hand Surgery    Referral Priority:  Routine    Referral Type:  Surgical    Referral Reason:  Specialty Services Required    Requested Specialty:  Hand Surgery    Number of Visits Requested:  1  pap to be added by Clyde Lundborg, CMA

## 2015-07-13 LAB — CYTOLOGY - PAP

## 2015-07-21 DIAGNOSIS — M65331 Trigger finger, right middle finger: Secondary | ICD-10-CM | POA: Insufficient documentation

## 2015-07-27 ENCOUNTER — Other Ambulatory Visit: Payer: Self-pay

## 2015-07-27 DIAGNOSIS — Z1231 Encounter for screening mammogram for malignant neoplasm of breast: Secondary | ICD-10-CM

## 2015-09-06 ENCOUNTER — Ambulatory Visit
Admission: RE | Admit: 2015-09-06 | Discharge: 2015-09-06 | Disposition: A | Discharge status: Home / Self Care | Payer: BLUE CROSS/BLUE SHIELD | Source: Ambulatory Visit

## 2015-09-06 ENCOUNTER — Encounter: Payer: Self-pay | Admitting: Internal Medicine

## 2015-09-06 ENCOUNTER — Ambulatory Visit (INDEPENDENT_AMBULATORY_CARE_PROVIDER_SITE_OTHER): Payer: BLUE CROSS/BLUE SHIELD | Admitting: Internal Medicine

## 2015-09-06 DIAGNOSIS — E1165 Type 2 diabetes mellitus with hyperglycemia: Secondary | ICD-10-CM

## 2015-09-06 DIAGNOSIS — Z1231 Encounter for screening mammogram for malignant neoplasm of breast: Secondary | ICD-10-CM

## 2015-09-06 MED ORDER — GLIPIZIDE ER 2.5 MG PO TB24: ORAL_TABLET | ORAL | Status: DC

## 2015-09-06 NOTE — Progress Notes (Signed)
Patient ID: Maria Hammond, female   DOB: 1961/04/16, 55 y.o.   MRN: [id number]  HPI: Maria Hammond is a 55 y.o.-year-old female, returning for f/u for DM2, dx 2008, non-insulin-dependent, uncontrolled, without complications. Last visit 3 mo ago.  She started to cut out sugar in 06/2015. Sugars >> MUCH better since then. Also, lost 12 lbs!  Last hemoglobin A1c was: Lab Results  Component Value Date   HGBA1C 8.7* 07/05/2015   HGBA1C 7.7 05/31/2015   HGBA1C 7.4 02/26/2015   Pt is on a regimen of: - Metformin 1000 mg po bid  - Glipizide XL 5 mg in am + 1/2 tab before dinner She could not afford Januvia and Tradjenta. We stopped Amaryl 2 mg in 03/2013 as her sugars were at goal and even had some lows and got lightheaded 4 h after b'fast, while at work (resolved after having a snack).   Pt check her sugars 2 x a day (+ log): - am: 91-120, fast food: 140s-150s >> 69, 90-120, 160 >> 101-152 >> 120-191, average ~150 >> 72-109 - 2h after b'fast (Sun and Thu when she is off): 76-161 >> n/c >> 91-123 >> n/c >> 106, 107 >> n/c >> 91 - lunch - 11 am: 96-138 >> n/c >> 167 >> 89 >> n/c >> 76, 78 - 2h after lunch: 99-144 >> n/c >> 114, 124 >> 81-140 >> n/c >> 92-136, 186 >> n/c >> 67, 75-140 - dinner - 3 pm: 62, 96-134 (222 x 1) >> 131 >> 70, 90-120 >> 89, 92 >> 71-116 >> 90-120 >> n/c >> 80s >> 79-115 - 2h after dinner: 99-72 >> 140-150 >> n/c >> 143 >> 170 (ave) >> 120-155, 195 >> 150-200 >> 77-152 - bedtime; 95, 132 >> n/c >> 83-123 Lowest: 80 >> 67; she has hypoglycemia awareness. Highest sugars: 212 >> 152  Meter: Relion.   Pt's meals are: - Breakfast (4 am): 3 strips [place] bacon + 1 egg + coffee - splenda and cream - snack: banana, nuts - Lunch: PB sandwich + bag of chips + yoghurt + fruit + diet Mtn Dew - snack: fruit and nuts - Dinner: sauerkraut + sausage  She works starting 5 in the morning. She wakes up at 2:30 AM. Goes to bed around 8 p.m.  - no CKD, last BUN/creatinine:  Lab  Results  Component Value Date   BUN 10 07/05/2015   CREATININE 0.59 07/05/2015  On lisinopril. - last set of lipids: Lab Results  Component Value Date   CHOL 138 07/05/2015   HDL 40.50 07/05/2015   LDLCALC 85 07/05/2015   LDLDIRECT 66.0 07/13/2014   TRIG 59.0 07/05/2015   CHOLHDL 3 07/05/2015  On Lipitor. - last eye exam was in Dr Chrissie Noa (08/31/2014). No DR. - no numbness and tingling in her feet. She saw a podiatrist.Foot exam performed 02/2015: normal.  She has hypothyroidism: Lab Results  Component Value Date   TSH 0.77 07/05/2015   ROS: Constitutional: no weight gain/loss, no fatigue, no subjective hyperthermia/hypothermia Eyes: no blurry vision, no xerophthalmia ENT: no sore throat, no nodules palpated in throat, no dysphagia/odynophagia, no hoarseness Cardiovascular: no CP/SOB/no palpitations/leg swelling Respiratory: no cough/SOB Gastrointestinal: no N/V/D/C Musculoskeletal: no muscle aches/ joint aches Skin: no rashes Neurological: no tremors/numbness/tingling/dizziness  I reviewed pt's medications, allergies, PMH, social hx, family hx, and changes were documented in the history of present illness. Otherwise, unchanged from my initial visit note.  PE: BP 108/68 mmHg  Pulse 62  Temp(Src) 98.6 F (  37 C) (Oral)  Ht 5\' 3"  (1.6 m)  Wt 190 lb (86.183 kg)  BMI 33.67 kg/m2  LMP 12/20/2011 Body mass index is 33.67 kg/(m^2).  Wt Readings from Last 3 Encounters:  09/06/15 190 lb (86.183 kg)  07/12/15 200 lb (90.719 kg)  05/31/15 202 lb (91.627 kg)   Constitutional: overweight, in NAD Eyes: PERRLA, EOMI, no exophthalmos ENT: moist mucous membranes, no thyromegaly, no cervical lymphadenopathy Cardiovascular: RRR, No MRG Respiratory: CTA B Gastrointestinal: abdomen soft, NT, ND, BS+ Musculoskeletal: no deformities, strength intact in all 4 Skin: moist, warm, no rashes  ASSESSMENT: 1. DM2, non-insulin-dependent, uncontrolled, without complications  PLAN:  1.  Patient with uncontrolled diabetes (last HbA1c 8.7%), with MUCH improved control since last visit due to dietary changes >> cut out sweets. Will continue Metformin, but decreased the glipizide XL in am as her sugars throughout the day re mostly in the 70-90 range. Will continue 2.5 mg Glipizide IR before dinner.  - I suggested to:  Patient Instructions  Please continue Metformin 1000 mg 2x a day Decrease Glipizide ER to 2.5 mg in am, before b'fast Add 1 crushed Glipizide ER 2.5 mg before dinner.  KEEP UP THE GREAT WORK!  Please return in 1.5 months with your sugar log.  - We can add Invokana if needed in the future, but not for now - last eye exam 08/2014 >> no DR - refused flu shot  - Return to clinic in 1.5 mo with sugar log

## 2015-09-06 NOTE — Patient Instructions (Signed)
Please continue Metformin 1000 mg 2x a day Decrease Glipizide ER to 2.5 mg in am, before b'fast Add 1 crushed Glipizide ER 2.5 mg before dinner.  KEEP UP THE GREAT WORK!  Please return in 1.5 months with your sugar log.

## 2015-10-25 ENCOUNTER — Other Ambulatory Visit: Payer: Self-pay

## 2015-10-25 ENCOUNTER — Encounter: Payer: Self-pay | Admitting: Internal Medicine

## 2015-10-25 ENCOUNTER — Ambulatory Visit (INDEPENDENT_AMBULATORY_CARE_PROVIDER_SITE_OTHER): Payer: BLUE CROSS/BLUE SHIELD | Admitting: Internal Medicine

## 2015-10-25 VITALS — BP 112/60 | HR 77 | Ht 64.0 in | Wt 188.0 lb

## 2015-10-25 DIAGNOSIS — E1169 Type 2 diabetes mellitus with other specified complication: Secondary | ICD-10-CM | POA: Diagnosis not present

## 2015-10-25 LAB — POCT GLYCOSYLATED HEMOGLOBIN (HGB A1C): Hemoglobin A1C: 6.3

## 2015-10-25 NOTE — Patient Instructions (Signed)
Please continue: - Metformin 1000 mg 2x a day - Glipizide ER to 2.5 mg in am, before b'fast  Use the crushed Glipizide ER 2.5 mg only before a larger dinner.  Please return in 3 months with your sugar log.

## 2015-10-25 NOTE — Progress Notes (Signed)
Patient ID: Maria Hammond, female   DOB: [birth year], 55 y.o.   MRN: [id number]  HPI: Maria Hammond is a 55 y.o.-year-old female, returning for f/u for DM2, dx 2008, non-insulin-dependent, uncontrolled, without complications. Last visit 3 mo ago.  She started to cut out sugar in 06/2015. Sugars >> MUCH better since then. Also, lost weight!  Last hemoglobin A1c was: Lab Results  Component Value Date   HGBA1C 6.3 10/25/2015   HGBA1C 8.7* 07/05/2015   HGBA1C 7.7 05/31/2015   Pt is on a regimen of: - Metformin 1000 mg 2x a day - Glipizide ER to 2.5 mg in am, before b'fast - 1 crushed Glipizide ER 2.5 mg before dinner. She could not afford Januvia and Tradjenta. We stopped Amaryl 2 mg in 03/2013 as her sugars were at goal and even had some lows and got lightheaded 4 h after b'fast, while at work (resolved after having a snack).   Pt check her sugars 2 x a day (+ log): - am: 91-120, fast food: 140s-150s >> 69, 90-120, 160 >> 101-152 >> 120-191, average ~150 >> 72-109 >> 68, 75-115 - 2h after b'fast (Sun and Thu when she is off): 76-161 >> n/c >> 91-123 >> n/c >> 106, 107 >> n/c >> 91 >> 80 - lunch - 11 am: 96-138 >> n/c >> 167 >> 89 >> n/c >> 76, 78 >> 68121 - 2h after lunch: 99-144 >> n/c >> 114, 124 >> 81-140 >> n/c >> 92-136, 186 >> n/c >> 67, 75-140 >> 76-126, 150 - dinner - 3 pm: 62, 96-134 (222 x 1) >> 131 >> 70, 90-120 >> 89, 92 >> 71-116 >> 90-120 >> n/c >> 80s >> 79-115 >> 79-125, 161, 170 - 2h after dinner: 99-72 >> 140-150 >> n/c >> 143 >> 170 (ave) >> 120-155, 195 >> 150-200 >> 77-152 >> n/c - bedtime; 95, 132 >> n/c >> 83-123 >> 70-83, 152 Lowest: 80 >> 67 >> 68; she has hypoglycemia awareness. Highest sugars: 212 >> 152 >> 170  Meter: Relion.   Pt's meals are: - Breakfast (4 am): 3 strips [place] bacon + 1 egg + coffee - splenda and cream - snack: banana, nuts - Lunch: PB sandwich + bag of chips + yoghurt + fruit + diet Mtn Dew - snack: fruit and nuts - Dinner: sauerkraut  + sausage  She works starting 5 in the morning. She wakes up at 2:30 AM. Goes to bed around 8 p.m.  - no CKD, last BUN/creatinine:  Lab Results  Component Value Date   BUN 10 07/05/2015   CREATININE 0.59 07/05/2015  On lisinopril. - last set of lipids: Lab Results  Component Value Date   CHOL 138 07/05/2015   HDL 40.50 07/05/2015   LDLCALC 85 07/05/2015   LDLDIRECT 66.0 07/13/2014   TRIG 59.0 07/05/2015   CHOLHDL 3 07/05/2015  On Lipitor. - last eye exam was in Dr Chrissie Noa (08/31/2014). No DR. Will have a new appt 11/08/2015: Dr Lady Gary. - no numbness and tingling in her feet. She saw a podiatrist.Foot exam performed 02/2015: normal.  She has hypothyroidism: Lab Results  Component Value Date   TSH 0.77 07/05/2015   ROS: Constitutional: no weight gain/loss, no fatigue, no subjective hyperthermia/hypothermia Eyes: no blurry vision, no xerophthalmia ENT: no sore throat, no nodules palpated in throat, no dysphagia/odynophagia, no hoarseness Cardiovascular: no CP/SOB/no palpitations/leg swelling Respiratory: no cough/SOB Gastrointestinal: no N/V/D/C Musculoskeletal: no muscle aches/ joint aches Skin: no rashes Neurological: no tremors/numbness/tingling/dizziness  I reviewed  pt's medications, allergies, PMH, social hx, family hx, and changes were documented in the history of present illness. Otherwise, unchanged from my initial visit note.  PE: BP 112/60 mmHg  Pulse 77  Ht 5\' 4"  (1.626 m)  Wt 188 lb (85.276 kg)  BMI 32.25 kg/m2  SpO2 95% Body mass index is 32.25 kg/(m^2).  Wt Readings from Last 3 Encounters:  10/25/15 188 lb (85.276 kg)  09/06/15 190 lb (86.183 kg)  07/12/15 200 lb (90.719 kg)   Constitutional: overweight, in NAD Eyes: PERRLA, EOMI, no exophthalmos ENT: moist mucous membranes, no thyromegaly, no cervical lymphadenopathy Cardiovascular: RRR, No MRG Respiratory: CTA B Gastrointestinal: abdomen soft NT, ND, BS+ Musculoskeletal: no deformities,  strength intact in all 4 Skin: moist, warm, no rashes  ASSESSMENT: 1. DM2, non-insulin-dependent, uncontrolled, without complications  PLAN:  1. Patient with uncontrolled diabetes (last HbA1c 8.7%), with MUCH improved control in last months due to dietary changes >> cut out sweets. Will continue Metformin, but as sugars at bedtime are mostly in the 70-90 range, will advise her to only use the 2.5 mg Glipizide IR before a larger dinner.  - I suggested to:  Patient Instructions  Please continue: - Metformin 1000 mg 2x a day - Glipizide ER to 2.5 mg in am, before b'fast  Use the crushed Glipizide ER 2.5 mg only before a larger dinner.  Please return in 3 months with your sugar log.   - We can add Invokana if needed in the future, but not for now - last eye exam 08/2014 >> no DR. Has another one coming up this month. - refused flu shot  - Return to clinic in 3 mo with sugar log

## 2015-10-27 ENCOUNTER — Other Ambulatory Visit: Payer: Self-pay | Admitting: Family Medicine

## 2015-11-08 LAB — HM DIABETES EYE EXAM

## 2015-12-24 ENCOUNTER — Other Ambulatory Visit: Payer: Self-pay | Admitting: Internal Medicine

## 2016-01-27 ENCOUNTER — Ambulatory Visit (INDEPENDENT_AMBULATORY_CARE_PROVIDER_SITE_OTHER): Payer: BLUE CROSS/BLUE SHIELD | Admitting: Internal Medicine

## 2016-01-27 ENCOUNTER — Encounter: Payer: Self-pay | Admitting: Internal Medicine

## 2016-01-27 VITALS — BP 120/70 | HR 71 | Ht 63.5 in | Wt 194.0 lb

## 2016-01-27 DIAGNOSIS — E1165 Type 2 diabetes mellitus with hyperglycemia: Secondary | ICD-10-CM | POA: Diagnosis not present

## 2016-01-27 LAB — POCT GLYCOSYLATED HEMOGLOBIN (HGB A1C): HEMOGLOBIN A1C: 6.5

## 2016-01-27 NOTE — Patient Instructions (Signed)
Please continue: - Metformin 1000 mg 2x a day - Glipizide ER to 2.5 mg in am, before b'fast and 2.5 mg before a larger dinner.  Please return in 3-4 months with your sugar log.

## 2016-01-27 NOTE — Progress Notes (Signed)
Patient ID: Maria Hammond, female   DOB: 1961-04-09, 55 y.o.   MRN: [id number]  HPI: Maria Hammond is a 55 y.o.-year-old female, returning for f/u for DM2, dx 2008, non-insulin-dependent, uncontrolled, without complications. Last visit 3 mo ago.  Last hemoglobin A1c was: Lab Results  Component Value Date   HGBA1C 6.3 10/25/2015   HGBA1C 8.7 (H) 07/05/2015   HGBA1C 7.7 05/31/2015   Pt is on a regimen of: - Metformin 1000 mg 2x a day - Glipizide ER to 2.5 mg in am, before b'fast - 1 crushed Glipizide ER 2.5 mg before a larger dinner She could not afford Januvia and Tradjenta. We stopped Amaryl 2 mg in 03/2013 as her sugars were at goal and even had some lows and got lightheaded 4 h after b'fast, while at work (resolved after having a snack).   Pt check her sugars 2 x a day (+ log): - am: 69, 90-120, 160 >> 101-152 >> 120-191, average ~150 >> 72-109 >> 68, 75-115 >> 82-111 - 2h after b'fast: 76-161 >> n/c >> 91-123 >> n/c >> 106, 107 >> n/c >> 91 >> 80 >> n/c - lunch - 11 am: 96-138 >> n/c >> 167 >> 89 >> n/c >> 76, 78 >> 68-121 >> n/c - 2h after lunch: 81-140 >> n/c >> 92-136, 186 >> n/c >> 67, 75-140 >> 76-126, 150 >> 106, 127 - dinner - 3 pm: 71-116 >> 90-120 >> n/c >> 80s >> 79-115 >> 79-125, 161, 170 >> 89, 149 - 2h after dinner:143 >> 170 (ave) >> 120-155, 195 >> 150-200 >> 77-152 >> n/c >> n/c - bedtime; 95, 132 >> n/c >> 83-123 >> 70-83, 152 >> 95, 118 Lowest: 80 >> 67 >> 68; she has hypoglycemia awareness. Highest sugars: 212 >> 152 >> 170  Meter: Relion.   Pt's meals are: - Breakfast (4 am): 3 strips [place] bacon + 1 egg + coffee - splenda and cream - snack: banana, nuts - Lunch: PB sandwich + bag of chips + yoghurt + fruit + diet Mtn Dew - snack: fruit and nuts - Dinner: sauerkraut + sausage  She works starting 5 in the morning. She wakes up at 2:30 AM. Goes to bed around 8 p.m.  - no CKD, last BUN/creatinine:  Lab Results  Component Value Date   BUN 10 07/05/2015    CREATININE 0.59 07/05/2015  On lisinopril. - last set of lipids: Lab Results  Component Value Date   CHOL 138 07/05/2015   HDL 40.50 07/05/2015   LDLCALC 85 07/05/2015   LDLDIRECT 66.0 07/13/2014   TRIG 59.0 07/05/2015   CHOLHDL 3 07/05/2015  On Lipitor. - last eye exam was in 11/08/2015: No DR. Dr Lady Gary. - no numbness and tingling in her feet. She saw a podiatrist.Foot exam performed 02/2015: normal.  She has hypothyroidism: Lab Results  Component Value Date   TSH 0.77 07/05/2015   ROS: Constitutional: no weight gain/loss, no fatigue, no subjective hyperthermia/hypothermia Eyes: no blurry vision, no xerophthalmia ENT: no sore throat, no nodules palpated in throat, no dysphagia/odynophagia, no hoarseness Cardiovascular: no CP/SOB/no palpitations/leg swelling Respiratory: no cough/SOB Gastrointestinal: no N/V/D/C Musculoskeletal: no muscle aches/ joint aches Skin: no rashes Neurological: no tremors/numbness/tingling/dizziness  I reviewed pt's medications, allergies, PMH, social hx, family hx, and changes were documented in the history of present illness. Otherwise, unchanged from my initial visit note.  PE: BP 120/70 (BP Location: Left Arm, Patient Position: Sitting)   Pulse 71   Ht 5' 3.5" (1.613  m)   Wt 194 lb (88 kg)   SpO2 96%   BMI 33.83 kg/m  Body mass index is 33.83 kg/m.  Wt Readings from Last 3 Encounters:  01/27/16 194 lb (88 kg)  10/25/15 188 lb (85.3 kg)  09/06/15 190 lb (86.2 kg)   Constitutional: overweight, in NAD Eyes: PERRLA, EOMI, no exophthalmos ENT: moist mucous membranes, no thyromegaly, no cervical lymphadenopathy Cardiovascular: RRR, No MRG Respiratory: CTA B Gastrointestinal: abdomen soft NT, ND, BS+ Musculoskeletal: no deformities, strength intact in all 4 Skin: moist, warm, no rashes  ASSESSMENT: 1. DM2, non-insulin-dependent, now controlled, without long term complications, ut with occasional hyperglycemia  PLAN:  1.  Patient with previously uncontrolled diabetes (last HbA1c 8.7%), now with [facility] improved control in last 6 months due to dietary changes and exercise. She has very few sugars above goal. Will continue current regimen. - I suggested to:  Patient Instructions  Please continue: - Metformin 1000 mg 2x a day - Glipizide ER to 2.5 mg in am, before b'fast and 2.5 mg before a larger dinner.  Please return in 3-4 months with your sugar log.   - We can add Invokana if needed in the future, but not for now - last eye exam 10/2015  - refused flu shot  - HbA1c is still great, at 6.5% - Return to clinic in 3 mo with sugar log   Philemon Kingdom, MD PhD [facility] End[facility]

## 2016-01-27 NOTE — Addendum Note (Signed)
Addended by: Caprice Beaver T on: 01/27/2016 04:33 PM   Modules accepted: Orders

## 2016-04-10 ENCOUNTER — Other Ambulatory Visit: Payer: Self-pay | Admitting: Family Medicine

## 2016-05-29 ENCOUNTER — Encounter: Payer: Self-pay | Admitting: Internal Medicine

## 2016-05-29 ENCOUNTER — Ambulatory Visit (INDEPENDENT_AMBULATORY_CARE_PROVIDER_SITE_OTHER): Payer: BLUE CROSS/BLUE SHIELD | Admitting: Internal Medicine

## 2016-05-29 VITALS — BP 130/74 | HR 75 | Ht 63.5 in | Wt 197.0 lb

## 2016-05-29 DIAGNOSIS — E1165 Type 2 diabetes mellitus with hyperglycemia: Secondary | ICD-10-CM | POA: Diagnosis not present

## 2016-05-29 LAB — POCT GLYCOSYLATED HEMOGLOBIN (HGB A1C): HEMOGLOBIN A1C: 8.1

## 2016-05-29 NOTE — Patient Instructions (Addendum)
Please continue: - Metformin 1000 mg 2x a day - Glipizide ER 2.5 mg in am, before b'fast and add a 2.5 mg crushed before a dinner.  Please return in 3 months with your sugar log.

## 2016-05-29 NOTE — Progress Notes (Signed)
Patient ID: Maria Hammond, female   DOB: [birth year], 56 y.o.   MRN: [id number]  HPI: Maria Hammond is a 56 y.o.-year-old female, returning for f/u for DM2, dx 2008, non-insulin-dependent, uncontrolled, without complications. Last visit 4 mo ago.  She had a lot of stress and stopped exercising since last visit. Her sugars were worse >> she stopped checking consistently  Last hemoglobin A1c was: Lab Results  Component Value Date   HGBA1C 6.5 01/27/2016   HGBA1C 6.3 10/25/2015   HGBA1C 8.7 (H) 07/05/2015   Pt is on a regimen of: - Metformin 1000 mg 2x a day - Glipizide ER to 2.5 mg in am, before b'fast  >> not taking She could not afford Januvia and Tradjenta. We stopped Amaryl 2 mg in 03/2013 as her sugars were at goal and even had some lows and got lightheaded 4 h after b'fast, while at work (resolved after having a snack).   Pt checked her sugars seldom in last 4 mo: - am: 69, 90-120, 160 >> 101-152 >> 120-191, average ~150 >> 72-109 >> 68, 75-115 >> 82-111 >> 130s - 2h after b'fast: 76-161 >> n/c >> 91-123 >> n/c >> 106, 107 >> n/c >> 91 >> 80 >> n/c - lunch - 11 am: 96-138 >> n/c >> 167 >> 89 >> n/c >> 76, 78 >> 68-121 >> n/c - 2h after lunch: 81-140 >> n/c >> 92-136, 186 >> n/c >> 67, 75-140 >> 76-126, 150 >> 106, 127 >> n/c - dinner - 3 pm: 71-116 >> 90-120 >> n/c >> 80s >> 79-115 >> 79-125, 161, 170 >> 89, 149 >> n/c - 2h after dinner:143 >> 170 (ave) >> 120-155, 195 >> 150-200 >> 77-152 >> n/c >> n/c - bedtime; 95, 132 >> n/c >> 83-123 >> 70-83, 152 >> 95, 118 >> n/c Lowest: 80 >> 67 >> 68 >> ?; she has hypoglycemia awareness. Highest sugars: 212 >> 152 >> 170 >> ?  Meter: Relion.   Pt's meals are: - Breakfast (4 am): 3 strips [place] bacon + 1 egg + coffee - splenda and cream - snack: banana, nuts - Lunch: PB sandwich + bag of chips + yoghurt + fruit + diet Mtn Dew - snack: fruit and nuts - Dinner: sauerkraut + sausage  She works starting 5 in the morning. She wakes up  at 2:30 AM. Goes to bed around 8 p.m.  - no CKD, last BUN/creatinine:  Lab Results  Component Value Date   BUN 10 07/05/2015   CREATININE 0.59 07/05/2015  On lisinopril. - last set of lipids: Lab Results  Component Value Date   CHOL 138 07/05/2015   HDL 40.50 07/05/2015   LDLCALC 85 07/05/2015   LDLDIRECT 66.0 07/13/2014   TRIG 59.0 07/05/2015   CHOLHDL 3 07/05/2015  On Lipitor. - last eye exam was in 11/08/2015: No DR. Dr Lady Gary. - no numbness and tingling in her feet. She saw a podiatrist.Foot exam performed 02/2015: normal.  She has hypothyroidism: Lab Results  Component Value Date   TSH 0.77 07/05/2015   ROS: Constitutional: + weight gain, no fatigue, no subjective hyperthermia/hypothermia Eyes: no blurry vision, no xerophthalmia ENT: no sore throat, no nodules palpated in throat, no dysphagia/odynophagia, no hoarseness Cardiovascular: no CP/SOB/no palpitations/leg swelling Respiratory: no cough/SOB Gastrointestinal: no N/V/D/C Musculoskeletal: no muscle aches/ joint aches Skin: no rashes Neurological: no tremors/numbness/tingling/dizziness  I reviewed pt's medications, allergies, PMH, social hx, family hx, and changes were documented in the history of present illness. Otherwise, unchanged  from my initial visit note.  PE: BP 130/74 (BP Location: Left Arm, Patient Position: Sitting)   Pulse 75   Ht 5' 3.5" (1.613 m)   Wt 197 lb (89.4 kg)   SpO2 95%   BMI 34.35 kg/m  Body mass index is 34.35 kg/m.  Wt Readings from Last 3 Encounters:  05/29/16 197 lb (89.4 kg)  01/27/16 194 lb (88 kg)  10/25/15 188 lb (85.3 kg)   Constitutional: overweight, in NAD Eyes: PERRLA, EOMI, no exophthalmos ENT: moist mucous membranes, no thyromegaly, no cervical lymphadenopathy Cardiovascular: RRR, No MRG Respiratory: CTA B Gastrointestinal: abdomen soft NT, ND, BS+ Musculoskeletal: no deformities, strength intact in all 4 Skin: moist, warm, no rashes  ASSESSMENT: 1.  DM2, non-insulin-dependent, uncontrolled, without long term complications, but with occasional hyperglycemia  PLAN:  1. Patient with previously uncontrolled diabetes, but with much improved control at last 2 visits due to dietary changes and exercise. Since last visit, she had stress at home and stopped exercising and checking sugars >> HbA1c today is 8.1% << much increased from: 6.5%. I suggested to add Januvia which is now tier 1 for her, but she refuses >> would like to restart exercising and resume prev. Diet. I advised her to add Glipizide before dinner. - I suggested to:  Patient Instructions  Please continue: - Metformin 1000 mg 2x a day - Glipizide ER 2.5 mg in am, before b'fast and add a 2.5 mg crushed before a dinner.  Please return in 3 months with your sugar log.   - UTD with eye exams - last: 10/2015  - refused flu shot  - Return to clinic in 3 mo with sugar log   Philemon Kingdom, MD PhD [facility] Endocrinology

## 2016-05-29 NOTE — Addendum Note (Signed)
Addended by: Caprice Beaver T on: 05/29/2016 09:27 AM   Modules accepted: Orders

## 2016-06-26 ENCOUNTER — Other Ambulatory Visit: Payer: Self-pay | Admitting: Family Medicine

## 2016-06-26 ENCOUNTER — Other Ambulatory Visit: Payer: Self-pay | Admitting: Internal Medicine

## 2016-07-10 ENCOUNTER — Other Ambulatory Visit (INDEPENDENT_AMBULATORY_CARE_PROVIDER_SITE_OTHER): Payer: BLUE CROSS/BLUE SHIELD

## 2016-07-10 DIAGNOSIS — E1165 Type 2 diabetes mellitus with hyperglycemia: Secondary | ICD-10-CM

## 2016-07-10 DIAGNOSIS — E118 Type 2 diabetes mellitus with unspecified complications: Secondary | ICD-10-CM

## 2016-07-10 LAB — CBC WITH DIFFERENTIAL/PLATELET
BASOS PCT: 0.4 % (ref 0.0–3.0)
Basophils Absolute: 0 10*3/uL (ref 0.0–0.1)
Eosinophils Absolute: 0.2 10*3/uL (ref 0.0–0.7)
Eosinophils Relative: 2.2 % (ref 0.0–5.0)
HEMATOCRIT: 39.2 % (ref 36.0–46.0)
Hemoglobin: 13.1 g/dL (ref 12.0–15.0)
Lymphocytes Relative: 33.5 % (ref 12.0–46.0)
Lymphs Abs: 2.9 10*3/uL (ref 0.7–4.0)
MCHC: 33.5 g/dL (ref 30.0–36.0)
MCV: 84.7 fl (ref 78.0–100.0)
MONOS PCT: 8.4 % (ref 3.0–12.0)
Monocytes Absolute: 0.7 10*3/uL (ref 0.1–1.0)
Neutro Abs: 4.8 10*3/uL (ref 1.4–7.7)
Neutrophils Relative %: 55.5 % (ref 43.0–77.0)
PLATELETS: 280 10*3/uL (ref 150.0–400.0)
RBC: 4.63 Mil/uL (ref 3.87–5.11)
RDW: 14 % (ref 11.5–15.5)
WBC: 8.6 10*3/uL (ref 4.0–10.5)

## 2016-07-10 LAB — POC URINALSYSI DIPSTICK (AUTOMATED)
Bilirubin, UA: NEGATIVE
GLUCOSE UA: NEGATIVE
Ketones, UA: NEGATIVE
NITRITE UA: NEGATIVE
PH UA: 6 (ref 5.0–8.0)
RBC UA: NEGATIVE
Spec Grav, UA: 1.02 (ref 1.030–1.035)
Urobilinogen, UA: 0.2 (ref ?–2.0)

## 2016-07-10 LAB — BASIC METABOLIC PANEL
BUN: 11 mg/dL (ref 6–23)
CHLORIDE: 104 meq/L (ref 96–112)
CO2: 30 meq/L (ref 19–32)
CREATININE: 0.54 mg/dL (ref 0.40–1.20)
Calcium: 9.6 mg/dL (ref 8.4–10.5)
GFR: 149.17 mL/min (ref >60.00)
Glucose, Bld: 122 mg/dL — ABNORMAL HIGH (ref 70–99)
Potassium: 4.4 mEq/L (ref 3.5–5.1)
Sodium: 141 mEq/L (ref 135–145)

## 2016-07-10 LAB — HEPATIC FUNCTION PANEL
ALT: 13 U/L (ref 0–35)
AST: 10 U/L (ref 0–37)
Albumin: 4 g/dL (ref 3.5–5.2)
Alkaline Phosphatase: 90 U/L (ref 39–117)
Bilirubin, Direct: 0.1 mg/dL (ref 0.0–0.3)
TOTAL PROTEIN: 6.9 g/dL (ref 6.0–8.3)
Total Bilirubin: 0.5 mg/dL (ref 0.2–1.2)

## 2016-07-10 LAB — LIPID PANEL
CHOLESTEROL: 166 mg/dL (ref 0–200)
HDL: 61.2 mg/dL (ref >39.00)
LDL Cholesterol: 92 mg/dL (ref 0–99)
NonHDL: 104.33
TRIGLYCERIDES: 60 mg/dL (ref 0.0–149.0)
Total CHOL/HDL Ratio: 3
VLDL: 12 mg/dL (ref 0.0–40.0)

## 2016-07-10 LAB — MICROALBUMIN / CREATININE URINE RATIO
Creatinine,U: 126.8 mg/dL
MICROALB UR: 6.2 mg/dL — AB (ref 0.0–1.9)
Microalb Creat Ratio: 4.9 mg/g (ref 0.0–30.0)

## 2016-07-10 LAB — HEMOGLOBIN A1C: HEMOGLOBIN A1C: 7.9 % — AB (ref 4.6–6.5)

## 2016-07-17 ENCOUNTER — Ambulatory Visit (INDEPENDENT_AMBULATORY_CARE_PROVIDER_SITE_OTHER): Payer: BLUE CROSS/BLUE SHIELD | Admitting: Family Medicine

## 2016-07-17 ENCOUNTER — Encounter: Payer: Self-pay | Admitting: Family Medicine

## 2016-07-17 VITALS — BP 118/58 | HR 80 | Temp 98.1°F | Ht 63.0 in | Wt 198.0 lb

## 2016-07-17 DIAGNOSIS — E039 Hypothyroidism, unspecified: Secondary | ICD-10-CM | POA: Diagnosis not present

## 2016-07-17 DIAGNOSIS — Z0000 Encounter for general adult medical examination without abnormal findings: Secondary | ICD-10-CM

## 2016-07-17 DIAGNOSIS — Z7251 High risk heterosexual behavior: Secondary | ICD-10-CM | POA: Diagnosis not present

## 2016-07-17 LAB — TSH: TSH: 0.52 u[IU]/mL (ref 0.35–4.50)

## 2016-07-17 MED ORDER — ATORVASTATIN CALCIUM 10 MG PO TABS
10.0000 mg | ORAL_TABLET | Freq: Every day | ORAL | 3 refills | Status: DC
Start: 2016-07-17 — End: 2017-07-03

## 2016-07-17 NOTE — Patient Instructions (Addendum)
Please stop by lab before you go  Lets work to get weight . Would love you to lose at least a pound a week before you see Dr. [name] back  No changes today

## 2016-07-17 NOTE — Progress Notes (Signed)
Pre visit review using our clinic review tool, if applicable. No additional management support is needed unless otherwise documented below in the visit note. 

## 2016-07-17 NOTE — Progress Notes (Signed)
Phone: [phone]  Subjective:  Patient presents today for their annual physical. Chief complaint-noted.   See problem oriented charting- ROS- full  review of systems was completed and negative except for: some upper left leg pain at times. No chest pain or shortness of breath. No headache or blurry vision.   The following were reviewed and entered/updated in epic: Past Medical History:  Diagnosis Date  . DIABETES MELLITUS, TYPE II 11/13/2006  . HYPERLIPIDEMIA 11/13/2006  . HYPERTENSION 05/19/2007  . HYPOTHYROIDISM 11/13/2006  . TOBACCO USE 12/16/2007   Quit 04/23/10     Patient Active Problem List   Diagnosis Date Noted  . Type 2 diabetes mellitus with hyperglycemia ([place]) 11/13/2006    Priority: High  . Essential hypertension 05/19/2007    Priority: Medium  . Hypothyroidism 11/13/2006    Priority: Medium  . Hyperlipidemia 11/13/2006    Priority: Medium  . Obesity 01/26/2012    Priority: Low   Past Surgical History:  Procedure Laterality Date  . CESAREAN SECTION    . TONSILLECTOMY  1970  . TRIGGER FINGER RELEASE     around 2012    Family History  Problem Relation Age of Onset  . Diabetes Mother   . Breast cancer Sister     age 15  . Diabetes Brother   . Diabetes Brother   . Heart disease Father   . Kidney disease Father   . Prostate cancer Father     Medications- reviewed and updated Current Outpatient Prescriptions  Medication Sig Dispense Refill  . atorvastatin (LIPITOR) 10 MG tablet Take 1 tablet (10 mg total) by mouth daily. 90 tablet 3  . glipiZIDE (GLUCOTROL XL) 2.5 MG 24 hr tablet take 1 whole tablet before b'fast and 1 crushed tablet before dinner 180 tablet 3  . levothyroxine (SYNTHROID, LEVOTHROID) 75 MCG tablet TAKE ONE TABLET BY MOUTH ONCE DAILY 90 tablet 3  . lisinopril (PRINIVIL,ZESTRIL) 5 MG tablet TAKE ONE TABLET BY MOUTH ONCE DAILY 90 tablet 3  . metFORMIN (GLUCOPHAGE) 1000 MG tablet TAKE ONE TABLET BY MOUTH TWICE DAILY WITH MEALS 180 tablet 1    No current facility-administered medications for this visit.     Allergies-reviewed and updated No Known Allergies  Social History   Social History  . Marital status: Married    Spouse name: N/A  . Number of children: N/A  . Years of education: N/A   Social History Main Topics  . Smoking status: Former Smoker    Packs/day: 1.00    Years: 30.00    Types: Cigarettes    Quit date: 04/23/2010  . Smokeless tobacco: Never Used  . Alcohol use No  . Drug use: No  . Sexual activity: Yes    Partners: Male   Other Topics Concern  . None   Social History Narrative   New relationship.    In process of divorce (starting 2015- still going 2018). 2 children- boy 67 autism goes to [facility] and works and girl 39 works at the jail in 2016. Son lives with her.  Husband no longer in house. Daughter in [place].       Works at [employer]- 24 years in May 2018.       Hobbies: tv, facebook/internet. Church.       Regular exercise: not at this time   Caffeine use: Diet Mt Dew    Objective: BP (!) 118/58 (BP Location: Left Arm, Patient Position: Sitting, Cuff Size: Large)   Pulse 80   Temp 98.1 F (36.7 C) (  Oral)   Ht 5\' 3"  (1.6 m)   Wt 198 lb (89.8 kg)   SpO2 95%   BMI 35.07 kg/m  Gen: NAD, resting comfortably HEENT: Mucous membranes are moist. Oropharynx normal Neck: no thyromegaly CV: RRR no murmurs rubs or gallops Lungs: CTAB no crackles, wheeze, rhonchi Abdomen: soft/nontender/nondistended/normal bowel sounds. No rebound or guarding.  Ext: no edema Skin: warm, dry Neuro: grossly normal, moves all extremities, PERRLA  Diabetic Foot Exam - Simple   Simple Foot Form Diabetic Foot exam was performed with the following findings:  Yes 07/17/2016  9:06 AM  Visual Inspection No deformities, no ulcerations, no other skin breakdown bilaterally:  Yes Sensation Testing Intact to touch and monofilament testing bilaterally:  Yes Pulse Check Posterior Tibialis and Dorsalis pulse intact  bilaterally:  Yes Comments      Assessment/Plan:  56 y.o. female presenting for annual physical.  Health Maintenance counseling: 1. Anticipatory guidance: Patient counseled regarding regular dental exams q6 months, eye exams yearly, wearing seatbelts.  2. Risk factor reduction:  Advised patient of need for regular exercise and diet rich and fruits and vegetables to reduce risk of heart attack and stroke. Exercise- had bene getting on treadmill at home 4x a week but several poor weeks since then. Diet-has reduced fried foods, still really struggling with sweets.  Wt Readings from Last 3 Encounters:  07/17/16 198 lb (89.8 kg)  05/29/16 197 lb (89.4 kg)  01/27/16 194 lb (88 kg)  3. Immunizations/screenings/ancillary studies Immunization History  Administered Date(s) Administered  . Pneumococcal Polysaccharide-23 06/10/2012  . Td 10/15/2008   Health Maintenance Due  Topic Date Due  . HIV Screening - today. does have new sexual partner- he had been tested before they were active 08/11/1973  . FOOT EXAM - normal today 11/23/2015   4. Cervical cancer screening- 2017 at our office with 3 year repeat 5. Breast cancer screening-  breast exam declined and mammogram 08/27/15 6. Colon cancer screening - 2010 with 10 year repeat  Status of chronic or acute concerns   S: poorly controlled. On glipizide 2.5mg  24 hr tablet BID and metformin 1g BID. Dr. Cruzita Lederer advised adding Celesta Gentile but she declined last visit.  Exercise and diet- see above notes Lab Results  Component Value Date   HGBA1C 7.9 (H) 07/10/2016   HGBA1C 8.1 05/29/2016   HGBA1C 6.5 01/27/2016   A/P: wants to continue to work on diabetes and exercise  Hypothyroidism- levothyroxine 75 mcg daily controlling issue Lab Results  Component Value Date   TSH 0.77 07/05/2015   HTN- controlled on lisinopril 5mg  alone  HLD- LDL at least under 100 on atorvastatin 10mg  Lab Results  Component Value Date   CHOL 166 07/10/2016   HDL 61.20  07/10/2016   LDLCALC 92 07/10/2016   LDLDIRECT 66.0 07/13/2014   TRIG 60.0 07/10/2016   CHOLHDL 3 07/10/2016   Return in about 1 year (around 07/17/2017) for physical.  Orders Placed This Encounter  Procedures  . HIV antibody  . TSH    [place]   Meds ordered this encounter  Medications  . atorvastatin (LIPITOR) 10 MG tablet    Sig: Take 1 tablet (10 mg total) by mouth daily.    Dispense:  90 tablet    Refill:  3   Return precautions advised.  Garret Reddish, MD

## 2016-07-18 LAB — HIV ANTIBODY (ROUTINE TESTING W REFLEX): HIV 1&2 Ab, 4th Generation: NONREACTIVE

## 2016-07-25 ENCOUNTER — Other Ambulatory Visit: Payer: Self-pay | Admitting: Family Medicine

## 2016-08-16 ENCOUNTER — Other Ambulatory Visit: Payer: Self-pay | Admitting: Family Medicine

## 2016-08-16 DIAGNOSIS — Z1231 Encounter for screening mammogram for malignant neoplasm of breast: Secondary | ICD-10-CM

## 2016-08-22 ENCOUNTER — Ambulatory Visit (INDEPENDENT_AMBULATORY_CARE_PROVIDER_SITE_OTHER): Payer: BLUE CROSS/BLUE SHIELD | Admitting: Internal Medicine

## 2016-08-22 ENCOUNTER — Encounter: Payer: Self-pay | Admitting: Internal Medicine

## 2016-08-22 ENCOUNTER — Other Ambulatory Visit: Payer: Self-pay | Admitting: Internal Medicine

## 2016-08-22 VITALS — BP 124/62 | HR 82 | Wt 201.0 lb

## 2016-08-22 DIAGNOSIS — E1165 Type 2 diabetes mellitus with hyperglycemia: Secondary | ICD-10-CM

## 2016-08-22 NOTE — Progress Notes (Signed)
Patient ID: Maria Hammond, female   DOB: 20-Apr-1961, 56 y.o.   MRN: [id number]  HPI: Maria Hammond is a 56 y.o.-year-old female, returning for f/u for DM2, dx 2008, non-insulin-dependent, uncontrolled, without complications. Last visit 3 mo ago.  She restarted exercise since last visit >> treadmill + weights + [employer].  Last hemoglobin A1c was: Lab Results  Component Value Date   HGBA1C 7.9 (H) 07/10/2016   HGBA1C 8.1 05/29/2016   HGBA1C 6.5 01/27/2016   Pt is on a regimen of: - Metformin 1000 mg 2x a day - Glipizide ER to 2.5 mg in am, before b'fast  1 crushed Glipizide ER 2.5 mg before dinner  She could not afford Januvia and Tradjenta. We stopped Amaryl 2 mg in 03/2013 as her sugars were at goal and even had some lows and got lightheaded 4 h after b'fast, while at work (resolved after having a snack).   Pt checked her sugars seldom in last 4 mo: - am: 120-191, average ~150 >> 72-109 >> 68, 75-115 >> 82-111 >> 130s >> 92, 101-137, 146 - 2h after b'fast: 91-123 >> n/c >> 106, 107 >> n/c >> 91 >> 80 >> n/c - lunch - 11 am: 96-138 >> n/c >> 167 >> 89 >> n/c >> 76, 78 >> 68-121 >> n/c  - 2h after lunch:  n/c >> 67, 75-140 >> 76-126, 150 >> 106, 127 >> n/c >> 111-189 - dinner - 3 pm:  80s >> 79-115 >> 79-125, 161, 170 >> 89, 149 >> n/c >> 123-145 - 2h after dinner:120-155, 195 >> 150-200 >> 77-152 >> n/c >> n/c >> 166 - bedtime; 95, 132 >> n/c >> 83-123 >> 70-83, 152 >> 95, 118 >> n/c Lowest: 80 >> 67 >> 68 >> ? >> 89; she has hypoglycemia awareness. Highest sugars: 212 >> 152 >> 170 >> ? >> 189  Meter: ReliOn  Pt's meals are: - Breakfast (4 am): 3 strips [place] bacon + 1 egg + coffee - splenda and cream - snack: banana, nuts - Lunch: PB sandwich + bag of chips + yoghurt + fruit + diet Mtn Dew - snack: fruit and nuts - Dinner: sauerkraut + sausage  She works starting 5 in the morning >> will start working at 4 am. She wakes up at 2:30 AM >> will move at 2 am. Doristine Church to bed around  8 p.m.  - no CKD, last BUN/creatinine:  Lab Results  Component Value Date   BUN 11 07/10/2016   CREATININE 0.54 07/10/2016  On lisinopril. - last set of lipids: Lab Results  Component Value Date   CHOL 166 07/10/2016   HDL 61.20 07/10/2016   LDLCALC 92 07/10/2016   LDLDIRECT 66.0 07/13/2014   TRIG 60.0 07/10/2016   CHOLHDL 3 07/10/2016  On Lipitor. - last eye exam was in 11/08/2015: No Maria. Dr Lady Hammond. - no numbness and tingling in her feet. She saw a podiatrist.Foot exam performed 02/2015: normal.  She has hypothyroidism: Lab Results  Component Value Date   TSH 0.52 07/17/2016   ROS: Constitutional: no weight gain/no weight loss, no fatigue, no subjective hyperthermia, no subjective hypothermia Eyes: no blurry vision, no xerophthalmia ENT: no sore throat, no nodules palpated in throat, no dysphagia, no odynophagia, no hoarseness Cardiovascular: no CP/no SOB/no palpitations/no leg swelling Respiratory: no cough/no SOB/no wheezing Gastrointestinal: no N/no V/no D/no C/no acid reflux Musculoskeletal: no muscle aches/no joint aches Skin: no rashes, no hair loss Neurological: no tremors/no numbness/no tingling/no dizziness  I  reviewed pt's medications, allergies, PMH, social hx, family hx, and changes were documented in the history of present illness. Otherwise, unchanged from my initial visit note.  PE: BP 124/62 (BP Location: Left Arm, Patient Position: Sitting)   Pulse 82   Wt 201 lb (91.2 kg)   SpO2 96%   BMI 35.61 kg/m   Body mass index is 35.61 kg/m.  Wt Readings from Last 3 Encounters:  08/22/16 201 lb (91.2 kg)  07/17/16 198 lb (89.8 kg)  05/29/16 197 lb (89.4 kg)   Constitutional: overweight, in NAD Eyes: PERRLA, EOMI, no exophthalmos ENT: moist mucous membranes, no thyromegaly, no cervical lymphadenopathy Cardiovascular: RRR, No MRG Respiratory: CTA B Gastrointestinal: abdomen soft NT, ND, BS+ Musculoskeletal: no deformities, strength intact in all  4 Skin: moist, warm, no rashes  ASSESSMENT: 1. DM2, non-insulin-dependent, uncontrolled, without long term complications, but with occasional hyperglycemia  PLAN:  1. Patient with previously uncontrolled diabetes, with higher sugars at last visit and an A1c that increased to 8.1%. Since then, sugars improved and only has a few that are higher than target. An HbA1c obtained last month was slightly better, at 7.9, however, I would've expected this to be better. At next visit, if HbA1c does not improve significantly, we will probably need to check a fructosamine level. - She restarted exercise, which I strongly advised her to continue.  - She will start working even earlier (will start at 4 am) which may influence her circadian rhythm and possibly her diabetes. - I suggested to:  Patient Instructions  Please continue: - Metformin 1000 mg 2x a day - Glipizide ER 2.5 mg in am, before b'fast  + a 2.5 mg crushed before a dinner.  Please return in 3 months with your sugar log.   - continue checking sugars at different times of the day - check 1x a day, rotating checks - advised for yearly eye exams >> she is UTD - advised for flu shot >> refused - Return to clinic in 3 mo with sugar log  Maria Kingdom, MD PhD [facility] Endocrinology

## 2016-08-22 NOTE — Patient Instructions (Addendum)
Patient Instructions  Please continue: - Metformin 1000 mg 2x a day - Glipizide ER 2.5 mg in am, before b'fast  + a 2.5 mg crushed before a dinner.  Please return in 3 months with your sugar log.

## 2016-09-08 ENCOUNTER — Ambulatory Visit
Admission: RE | Admit: 2016-09-08 | Discharge: 2016-09-08 | Disposition: A | Discharge status: Home / Self Care | Payer: BLUE CROSS/BLUE SHIELD | Source: Ambulatory Visit | Attending: Family Medicine | Admitting: Family Medicine

## 2016-09-08 DIAGNOSIS — Z1231 Encounter for screening mammogram for malignant neoplasm of breast: Secondary | ICD-10-CM

## 2016-11-08 LAB — HM DIABETES EYE EXAM

## 2016-12-08 ENCOUNTER — Ambulatory Visit: Payer: BLUE CROSS/BLUE SHIELD | Admitting: Internal Medicine

## 2016-12-11 ENCOUNTER — Ambulatory Visit (INDEPENDENT_AMBULATORY_CARE_PROVIDER_SITE_OTHER): Payer: BLUE CROSS/BLUE SHIELD | Admitting: Internal Medicine

## 2016-12-11 ENCOUNTER — Encounter: Payer: Self-pay | Admitting: Internal Medicine

## 2016-12-11 VITALS — BP 122/60 | HR 72 | Ht 63.5 in | Wt 199.0 lb

## 2016-12-11 DIAGNOSIS — E039 Hypothyroidism, unspecified: Secondary | ICD-10-CM | POA: Diagnosis not present

## 2016-12-11 DIAGNOSIS — E1165 Type 2 diabetes mellitus with hyperglycemia: Secondary | ICD-10-CM | POA: Diagnosis not present

## 2016-12-11 LAB — POCT GLYCOSYLATED HEMOGLOBIN (HGB A1C): Hemoglobin A1C: 7

## 2016-12-11 MED ORDER — FREESTYLE LIBRE SENSOR SYSTEM MISC: 1.0000 | 11 refills | Status: DC

## 2016-12-11 MED ORDER — FREESTYLE LIBRE READER DEVI: 1.0000 | Freq: Three times a day (TID) | 1 refills | Status: DC

## 2016-12-11 NOTE — Patient Instructions (Signed)
Please continue: - Metformin 1000 mg 2x a day - Glipizide ER 2.5 mg in am, before b'fast  + a 2.5 mg crushed before a dinner.  Please return in 3 months with your sugar log.

## 2016-12-11 NOTE — Addendum Note (Signed)
Addended by: Caprice Beaver T on: 12/11/2016 02:57 PM   Modules accepted: Orders

## 2016-12-11 NOTE — Progress Notes (Signed)
Patient ID: Maria Hammond, female   DOB: [birth year], 58 y.o.   MRN: [id number]  HPI: Maria Hammond is a 56 y.o.-year-old female, returning for f/u for DM2, dx 2008, non-insulin-dependent, uncontrolled, without complications. Last visit 3.5 mo ago.  Last hemoglobin A1c was: Lab Results  Component Value Date   HGBA1C 7.9 (H) 07/10/2016   HGBA1C 8.1 05/29/2016   HGBA1C 6.5 01/27/2016   Pt is on a regimen of: - Metformin 1000 mg 2x a day - Glipizide ER to 2.5 mg in am, before b'fast  1 crushed Glipizide ER 2.5 mg before dinner  She could not afford Januvia and Tradjenta. We stopped Amaryl 2 mg in 03/2013 as her sugars were at goal and even had some lows and got lightheaded 4 h after b'fast, while at work (resolved after having a snack).   Pt checked her sugars 0-1x a day: - am: 82-111 >> 130s >> 92, 101-137, 146  >> 83-125 - 2h after b'fast:  n/c >> 91 >> 80 >> n/c >> 124 - lunch - 11 am:  76, 78 >> 68-121 >> n/c  - 2h after lunch: 106, 127 >> n/c >> 111-189 >> 106- 127 - dinner - 3 pm:  89, 149 >> n/c >> 123-145 >> n/c - 2h after dinner:77-152 >> n/c >> n/c >> 166 >> 118 - bedtime;83-123 >> 70-83, 152 >> 95, 118 >> n/c  Lowest: 89 >> 68 x1; she has hypoglycemia awareness. Highest sugars: 189 >> 242.  Meter: ReliOn  Pt's meals are: - Breakfast (4 am): 3 strips [place] bacon + 1 egg + coffee - splenda and cream - snack: banana, nuts - Lunch: PB sandwich + bag of chips + yoghurt + fruit + diet Mtn Dew - snack: fruit and nuts - Dinner: sauerkraut + sausage  She works starting 5 in the morning >> will start working at 4 am. She wakes up at 2 am Goes to bed around 8 p.m.  - No CKD, last BUN/creatinine:  Lab Results  Component Value Date   BUN 11 07/10/2016   CREATININE 0.54 07/10/2016  On Lisinopril - last set of lipids: Lab Results  Component Value Date   CHOL 166 07/10/2016   HDL 61.20 07/10/2016   LDLCALC 92 07/10/2016   LDLDIRECT 66.0 07/13/2014   TRIG 60.0 07/10/2016    CHOLHDL 3 07/10/2016  On Lipitor. - last eye exam was in 10/2016 >> no DR. Dr Lady Gary. - denies numbness and tingling in her feet. She saw a podiatrist.  She has hypothyroidism: Lab Results  Component Value Date   TSH 0.52 07/17/2016   She is on daily LT4 75 mcg daily.  ROS: Constitutional: no weight gain/no weight loss, no fatigue, no subjective hyperthermia, no subjective hypothermia Eyes: no blurry vision, no xerophthalmia ENT: no sore throat, no nodules palpated in throat, no dysphagia, no odynophagia, no hoarseness Cardiovascular: no CP/no SOB/no palpitations/no leg swelling Respiratory: no cough/no SOB/no wheezing Gastrointestinal: no N/no V/no D/no C/no acid reflux Musculoskeletal: no muscle aches/no joint aches Skin: no rashes, no hair loss Neurological: no tremors/no numbness/no tingling/no dizziness  I reviewed pt's medications, allergies, PMH, social hx, family hx, and changes were documented in the history of present illness. Otherwise, unchanged from my initial visit note.  PE: BP 122/60 (BP Location: Left Arm, Patient Position: Sitting)   Pulse 72   Ht 5' 3.5" (1.613 m)   Wt 199 lb (90.3 kg)   LMP 12/20/2011   SpO2 98%   BMI  34.70 kg/m  Body mass index is 34.7 kg/m.  Wt Readings from Last 3 Encounters:  12/11/16 199 lb (90.3 kg)  08/22/16 201 lb (91.2 kg)  07/17/16 198 lb (89.8 kg)   Constitutional: overweight, in NAD Eyes: PERRLA, EOMI, no exophthalmos ENT: moist mucous membranes, no thyromegaly, no cervical lymphadenopathy Cardiovascular: RRR, No MRG Respiratory: CTA B Gastrointestinal: abdomen soft, NT, ND, BS+ Musculoskeletal: no deformities, strength intact in all 4 Skin: moist, warm, no rashes Neurological: no tremor with outstretched hands, DTR normal in all 4  ASSESSMENT: 1. DM2, non-insulin-dependent, uncontrolled, without long term complications, but with occasional hyperglycemia  2. Hypothyroidism  PLAN:  1. Patient with  previously uncontrolled diabetes, with improved sugars at this visit  - we will not change her regimen for now - she is interested in the [employer] CGM >> discussed about how it works >> will send a Rx to her pharmacy - I suggested to:  Patient Instructions  Please continue: - Metformin 1000 mg 2x a day - Glipizide ER 2.5 mg in am, before b'fast  + a 2.5 mg crushed before a dinner.  Please return in 3 months with your sugar log.   - today, HbA1c is 7% (lower) - continue checking sugars at different times of the day - check 1x a day, rotating checks - advised for yearly eye exams >> she is UTD - Return to clinic in 3 mo with sugar log   2. Hypothyroidism - controlled, on LT4 75 mcg daily  Philemon Kingdom, MD PhD [facility] End[facility]

## 2017-01-02 ENCOUNTER — Other Ambulatory Visit: Payer: Self-pay | Admitting: Internal Medicine

## 2017-03-13 ENCOUNTER — Encounter: Payer: Self-pay | Admitting: Internal Medicine

## 2017-03-13 ENCOUNTER — Ambulatory Visit (INDEPENDENT_AMBULATORY_CARE_PROVIDER_SITE_OTHER): Payer: BLUE CROSS/BLUE SHIELD | Admitting: Internal Medicine

## 2017-03-13 VITALS — BP 122/64 | HR 74 | Wt 196.0 lb

## 2017-03-13 DIAGNOSIS — E1165 Type 2 diabetes mellitus with hyperglycemia: Secondary | ICD-10-CM | POA: Diagnosis not present

## 2017-03-13 DIAGNOSIS — E039 Hypothyroidism, unspecified: Secondary | ICD-10-CM

## 2017-03-13 LAB — POCT GLYCOSYLATED HEMOGLOBIN (HGB A1C): Hemoglobin A1C: 7.2

## 2017-03-13 NOTE — Progress Notes (Signed)
Patient ID: Maria Hammond, female   DOB: 1960-05-24, 56 y.o.   MRN: [id number]  HPI: Maria Hammond is a 56 y.o.-year-old female, returning for f/u for DM2, dx 2008, non-insulin-dependent, uncontrolled, without long term complications. Last visit 3 mo ago.  Last hemoglobin A1c was: Lab Results  Component Value Date   HGBA1C 7.0 12/11/2016   HGBA1C 7.9 (H) 07/10/2016   HGBA1C 8.1 05/29/2016   Pt is on a regimen of: - Metformin 1000 mg 2x a day  - the second dose 2h before dinner - Glipizide ER 2.5 mg in am, before b'fast  1 crushed Glipizide ER 2.5 mg 2h before dinner She could not afford Januvia and Tradjenta. We stopped Amaryl 2 mg in 03/2013 as her sugars were at goal and even had some lows and got lightheaded 4 h after b'fast, while at work (resolved after having a snack).   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 She wakes up at 2 am Goes to bed around 8 p.m.  - No CKD, last BUN/creatinine:  Lab Results  Component Value Date   BUN 11 07/10/2016   CREATININE 0.54 07/10/2016  On Lisinopril - last set of lipids: Lab Results   Component Value Date   CHOL 166 07/10/2016   HDL 61.20 07/10/2016   LDLCALC 92 07/10/2016   LDLDIRECT 66.0 07/13/2014   TRIG 60.0 07/10/2016   CHOLHDL 3 07/10/2016  On Lipitor - last eye exam was in 10/2016 >> No DR. Dr Lady Gary. - no numbness and tingling in her feet. She saw a podiatrist.  She has hypothyroidism: Lab Results  Component Value Date   TSH 0.52 07/17/2016   She is on daily LT4 75 mcg daily: - in am - fasting - at least 30 min from b'fast - no Ca, Fe, MVI, PPIs - not on Biotin  ROS: Constitutional: no weight gain/no weight loss, no fatigue, no subjective hyperthermia, no subjective hypothermia Eyes: no blurry vision, no xerophthalmia ENT: no sore throat, no nodules palpated in throat, no dysphagia, no odynophagia, no hoarseness Cardiovascular: no CP/no SOB/no palpitations/no leg swelling Respiratory: no cough/no SOB/no wheezing Gastrointestinal: no N/no V/no D/no C/no acid reflux Musculoskeletal: no muscle aches/no joint aches Skin: no rashes, no hair loss Neurological: no tremors/no numbness/no tingling/no dizziness  I reviewed pt's medications, allergies, PMH, social hx, family hx, and changes were documented in  the history of present illness. Otherwise, unchanged from my initial visit note.  PE: BP 122/64 (BP Location: Left Arm, Patient Position: Sitting)   Pulse 74   Wt 196 lb (88.9 kg)   LMP 12/20/2011   SpO2 96%   BMI 34.18 kg/m  Body mass index is 34.18 kg/m.  Wt Readings from Last 3 Encounters:  03/13/17 196 lb (88.9 kg)  12/11/16 199 lb (90.3 kg)  08/22/16 201 lb (91.2 kg)   Constitutional: overweight, in NAD Eyes: PERRLA, EOMI, no exophthalmos ENT: moist mucous membranes, no thyromegaly, no cervical lymphadenopathy Cardiovascular: RRR, No MRG Respiratory: CTA B Gastrointestinal: abdomen soft, NT, ND, BS+ Musculoskeletal: no deformities, strength intact in all 4 Skin: moist, warm, no rashes Neurological: no tremor with outstretched  hands, DTR normal in all 4  ASSESSMENT: 1. DM2, non-insulin-dependent, uncontrolled, without long term complications, but with occasional hyperglycemia  2. Hypothyroidism  PLAN:  1. Patient with overall good diabetes control, but with a few more hyperglycemic spikes since last visit.  Her highest blood sugar was in the 200s after dinner. -  I would not suggest to change the regimen for now, but we discussed about the timing of her medicines.  She is currently taking glipizide and metformin without the relationship with her meals and I strongly advised her to move glipizide 15-30 minutes before a meal in the metformin with a meal.  Otherwise, no changes are necessary in her medication doses. - she could not obtain the freestyle libre CGM as this was not covered by her insurance - I suggested to:  Patient Instructions  Please continue: - Metformin 1000 mg 2x a day - Glipizide ER 2.5 mg in am, before b'fast  + a 2.5 mg crushed before dinner.  Please stop at the lab.  Please return in 3-4 months with your sugar log.   - today, HbA1c is 7.2% (higher), however, I would also like to check a fructosamine to verify the HbA1c - continue checking sugars at different times of the day - check 1x a day, rotating checks - advised for yearly eye exams >> she is UTD - refuses flu shot - Return to clinic in 3-4 mo with sugar log   2. Hypothyroidism - controlled - continues on LT4 75 mcg daily.  She takes this correctly. - no complaints  Office Visit on 03/13/2017  Component Date Value Ref Range Status  . Fructosamine 03/13/2017 268  190 - 270 umol/L Final  . Hemoglobin A1C 03/13/2017 7.2   Final   The HbAac calculated from the fructosamine is much better, at 6.16%!  Philemon Kingdom, MD PhD [facility] End[facility]inology

## 2017-03-13 NOTE — Patient Instructions (Addendum)
Please continue: - Metformin 1000 mg 2x a day - Glipizide ER 2.5 mg in am, before b'fast  + a 2.5 mg crushed before dinner.  Please stop at the lab.  Please return in 3-4 months with your sugar log.

## 2017-03-16 LAB — FRUCTOSAMINE: Fructosamine: 268 umol/L (ref 190–270)

## 2017-07-03 ENCOUNTER — Other Ambulatory Visit: Payer: Self-pay | Admitting: Family Medicine

## 2017-07-10 ENCOUNTER — Other Ambulatory Visit: Payer: Self-pay | Admitting: Internal Medicine

## 2017-07-10 ENCOUNTER — Other Ambulatory Visit: Payer: Self-pay | Admitting: Family Medicine

## 2017-07-12 ENCOUNTER — Ambulatory Visit: Payer: BLUE CROSS/BLUE SHIELD | Admitting: Internal Medicine

## 2017-07-19 ENCOUNTER — Ambulatory Visit (INDEPENDENT_AMBULATORY_CARE_PROVIDER_SITE_OTHER): Payer: BLUE CROSS/BLUE SHIELD | Admitting: Family Medicine

## 2017-07-19 ENCOUNTER — Encounter: Payer: BLUE CROSS/BLUE SHIELD | Admitting: Family Medicine

## 2017-07-19 ENCOUNTER — Encounter: Payer: Self-pay | Admitting: Family Medicine

## 2017-07-19 VITALS — BP 118/64 | HR 79 | Temp 98.2°F | Ht 63.5 in | Wt 195.0 lb

## 2017-07-19 DIAGNOSIS — H6123 Impacted cerumen, bilateral: Secondary | ICD-10-CM | POA: Diagnosis not present

## 2017-07-19 DIAGNOSIS — I10 Essential (primary) hypertension: Secondary | ICD-10-CM

## 2017-07-19 DIAGNOSIS — Z0000 Encounter for general adult medical examination without abnormal findings: Secondary | ICD-10-CM

## 2017-07-19 DIAGNOSIS — E119 Type 2 diabetes mellitus without complications: Secondary | ICD-10-CM

## 2017-07-19 DIAGNOSIS — E039 Hypothyroidism, unspecified: Secondary | ICD-10-CM

## 2017-07-19 DIAGNOSIS — E782 Mixed hyperlipidemia: Secondary | ICD-10-CM | POA: Diagnosis not present

## 2017-07-19 LAB — CBC WITH DIFFERENTIAL/PLATELET
BASOS ABS: 0 10*3/uL (ref 0.0–0.1)
Basophils Relative: 0.5 % (ref 0.0–3.0)
EOS ABS: 0.1 10*3/uL (ref 0.0–0.7)
Eosinophils Relative: 1.5 % (ref 0.0–5.0)
HCT: 39.4 % (ref 36.0–46.0)
Hemoglobin: 13.1 g/dL (ref 12.0–15.0)
Lymphocytes Relative: 35.3 % (ref 12.0–46.0)
Lymphs Abs: 2.9 10*3/uL (ref 0.7–4.0)
MCHC: 33.3 g/dL (ref 30.0–36.0)
MCV: 85.6 fl (ref 78.0–100.0)
MONOS PCT: 7.9 % (ref 3.0–12.0)
Monocytes Absolute: 0.7 10*3/uL (ref 0.1–1.0)
NEUTROS ABS: 4.5 10*3/uL (ref 1.4–7.7)
Neutrophils Relative %: 54.8 % (ref 43.0–77.0)
PLATELETS: 269 10*3/uL (ref 150.0–400.0)
RBC: 4.61 Mil/uL (ref 3.87–5.11)
RDW: 13.8 % (ref 11.5–15.5)
WBC: 8.3 10*3/uL (ref 4.0–10.5)

## 2017-07-19 LAB — BASIC METABOLIC PANEL
BUN: 10 mg/dL (ref 6–23)
CHLORIDE: 103 meq/L (ref 96–112)
CO2: 30 meq/L (ref 19–32)
Calcium: 9.3 mg/dL (ref 8.4–10.5)
Creatinine, Ser: 0.56 mg/dL (ref 0.40–1.20)
GFR: 142.53 mL/min (ref >60.00)
Glucose, Bld: 106 mg/dL — ABNORMAL HIGH (ref 70–99)
POTASSIUM: 3.9 meq/L (ref 3.5–5.1)
Sodium: 139 mEq/L (ref 135–145)

## 2017-07-19 LAB — HEMOGLOBIN A1C: HEMOGLOBIN A1C: 7.7 % — AB (ref 4.6–6.5)

## 2017-07-19 LAB — TSH: TSH: 0.48 u[IU]/mL (ref 0.35–4.50)

## 2017-07-19 LAB — LIPID PANEL
CHOLESTEROL: 156 mg/dL (ref 0–200)
HDL: 61.8 mg/dL (ref >39.00)
LDL Cholesterol: 83 mg/dL (ref 0–99)
NonHDL: 94.47
TRIGLYCERIDES: 56 mg/dL (ref 0.0–149.0)
Total CHOL/HDL Ratio: 3
VLDL: 11.2 mg/dL (ref 0.0–40.0)

## 2017-07-19 NOTE — Progress Notes (Signed)
Subjective:     Maria Hammond is a 57 y.o. female and is here for a comprehensive physical exam. The patient reports no problems.  Patient states she checks her blood sugars a few times per week.  Range has been 90-135 per memory.  Patient states she is not exercising.  Patient drinks 4-5 diet sodas per day and eats a lot of sandwiches.  Mammogram to be done in May.  Patient is unsure about the last Pap.  Patient gets regular dental checkups.  Patient is taking Lipitor 10 mg daily.  She denies myalgias.  Patient is a former smoker quit in 2011.  Social History   Socioeconomic History  . Marital status: Married    Spouse name: Not on file  . Number of children: Not on file  . Years of education: Not on file  . Highest education level: Not on file  Occupational History  . Not on file  Social Needs  . Financial resource strain: Not on file  . Food insecurity:    Worry: Not on file    Inability: Not on file  . Transportation needs:    Medical: Not on file    Non-medical: Not on file  Tobacco Use  . Smoking status: Former Smoker    Packs/day: 1.00    Years: 30.00    Pack years: 30.00    Types: Cigarettes    Last attempt to quit: 04/23/2010    Years since quitting: 7.2  . Smokeless tobacco: Never Used  Substance and Sexual Activity  . Alcohol use: No    Alcohol/week: 0.0 oz  . Drug use: No  . Sexual activity: Yes    Partners: Male  Lifestyle  . Physical activity:    Days per week: Not on file    Minutes per session: Not on file  . Stress: Not on file  Relationships  . Social connections:    Talks on phone: Not on file    Gets together: Not on file    Attends religious service: Not on file    Active member of club or organization: Not on file    Attends meetings of clubs or organizations: Not on file    Relationship status: Not on file  . Intimate partner violence:    Fear of current or ex partner: Not on file    Emotionally abused: Not on file    Physically abused:  Not on file    Forced sexual activity: Not on file  Other Topics Concern  . Not on file  Social History Narrative   New relationship.    In process of divorce (starting 2015- still going 2018). 2 children- boy 22 autism goes to [employer] [facility] and works and girl 39 works at the jail in 2016. Son lives with her.  Husband no longer in house. Daughter in [place].       Works at [employer]- 24 years in May 2018.       Hobbies: tv, facebook/internet. Church.       Regular exercise: not at this time   Caffeine use: Diet [place] Maintenance  Topic Date Due  . Hepatitis C Screening  Sep 25, 1960  . INFLUENZA VACCINE  11/22/2016  . PNEUMOCOCCAL POLYSACCHARIDE VACCINE (2) 06/10/2017  . FOOT EXAM  07/17/2017  . HEMOGLOBIN A1C  09/10/2017  . OPHTHALMOLOGY EXAM  11/08/2017  . PAP SMEAR  07/12/2018  . MAMMOGRAM  09/09/2018  . TETANUS/TDAP  10/16/2018  . COLONOSCOPY  11/12/2018  .  HIV Screening  Completed    The following portions of the patient's history were reviewed and updated as appropriate: allergies, current medications, past family history, past medical history, past social history, past surgical history and problem list.  Review of Systems A comprehensive review of systems was negative.   Objective:    BP 118/64 (BP Location: Left Arm, Patient Position: Sitting, Cuff Size: Normal)   Pulse 79   Temp 98.2 F (36.8 C) (Oral)   Ht 5' 3.5" (1.613 m)   Wt 195 lb (88.5 kg)   LMP 12/20/2011   SpO2 96%   BMI 34.00 kg/m  General appearance: alert, cooperative, appears stated age and no distress Head: Normocephalic, without obvious abnormality, atraumatic Eyes: conjunctivae/corneas clear. PERRL, EOM's intact. Fundi benign. Ears: Bilateral canals with impacted cerumen.  After irrigation, normal TM's and external ear canals both ears Nose: Nares normal. Septum midline. Mucosa normal. No drainage or sinus tenderness. Throat: lips, mucosa, and tongue normal; teeth and gums normal Neck: no  adenopathy, no carotid bruit, no JVD, supple, symmetrical, trachea midline and thyroid not enlarged, symmetric, no tenderness/mass/nodules Lungs: clear to auscultation bilaterally Heart: regular rate and rhythm, S1, S2 normal, no murmur, click, rub or gallop Abdomen: soft, non-tender; bowel sounds normal; no masses,  no organomegaly Extremities: extremities normal, atraumatic, no cyanosis or edema Skin: Skin color, texture, turgor normal. No rashes or lesions Neurologic: Alert and oriented X 3, normal strength and tone. Normal symmetric reflexes. Normal coordination and gait    Assessment:    Healthy female exam. With controlled htn, hypothyroidism, and DM II     Plan:      Well adult -Anticipatory guidance given including wearing seatbelts, smoke detectors in the home, increasing physical activity, increasing p.o. intake of water and vegetables. -Last Pap 07/13/15.  Due for Pap next year HPV testing not specified -We will order labs this visit See After Visit Summary for Counseling Recommendations    Hypothyroidism -Continue Synthroid 75 mcg -We will test TSH at this visit  HTN -Controlled -Continue lisinopril 5 mg daily -Lifestyle modifications encouraged  HLD -Continue Lipitor 10 mg -We will check lipid panel this visit  DM 2 -Continue metformin 1000 mg twice daily and glipizide XL 2.5 mg daily -Patient encouraged to check her blood sugars more frequently and to bring a log with her to her appointments -Patient encouraged to start exercising, make better food choices, and decreasing the number of diet sodas she drinks per day -Patient encouraged to continue following with endocrinology, Dr. Cruzita Lederer.  Cerumen impaction -Informed consent obtained.  Bilateral ears irrigated.  Patient tolerated procedure well.  Follow-up PRN  Grier Mitts, MD

## 2017-07-19 NOTE — Patient Instructions (Signed)
Preventive Care 40-64 Years, Female Preventive care refers to lifestyle choices and visits with your health care provider that can promote health and wellness. What does preventive care include?  A yearly physical exam. This is also called an annual well check.  Dental exams once or twice a year.  Routine eye exams. Ask your health care provider how often you should have your eyes checked.  Personal lifestyle choices, including: ? Daily care of your teeth and gums. ? Regular physical activity. ? Eating a healthy diet. ? Avoiding tobacco and drug use. ? Limiting alcohol use. ? Practicing safe sex. ? Taking low-dose aspirin daily starting at age 58. ? Taking vitamin and mineral supplements as recommended by your health care provider. What happens during an annual well check? The services and screenings done by your health care provider during your annual well check will depend on your age, overall health, lifestyle risk factors, and family history of disease. Counseling Your health care provider may ask you questions about your:  Alcohol use.  Tobacco use.  Drug use.  Emotional well-being.  Home and relationship well-being.  Sexual activity.  Eating habits.  Work and work Statistician.  Method of birth control.  Menstrual cycle.  Pregnancy history.  Screening You may have the following tests or measurements:  Height, weight, and BMI.  Blood pressure.  Lipid and cholesterol levels. These may be checked every 5 years, or more frequently if you are over 81 years old.  Skin check.  Lung cancer screening. You may have this screening every year starting at age 78 if you have a 30-pack-year history of smoking and currently smoke or have quit within the past 15 years.  Fecal occult blood test (FOBT) of the stool. You may have this test every year starting at age 65.  Flexible sigmoidoscopy or colonoscopy. You may have a sigmoidoscopy every 5 years or a colonoscopy  every 10 years starting at age 30.  Hepatitis C blood test.  Hepatitis B blood test.  Sexually transmitted disease (STD) testing.  Diabetes screening. This is done by checking your blood sugar (glucose) after you have not eaten for a while (fasting). You may have this done every 1-3 years.  Mammogram. This may be done every 1-2 years. Talk to your health care provider about when you should start having regular mammograms. This may depend on whether you have a family history of breast cancer.  BRCA-related cancer screening. This may be done if you have a family history of breast, ovarian, tubal, or peritoneal cancers.  Pelvic exam and Pap test. This may be done every 3 years starting at age 80. Starting at age 36, this may be done every 5 years if you have a Pap test in combination with an HPV test.  Bone density scan. This is done to screen for osteoporosis. You may have this scan if you are at high risk for osteoporosis.  Discuss your test results, treatment options, and if necessary, the need for more tests with your health care provider. Vaccines Your health care provider may recommend certain vaccines, such as:  Influenza vaccine. This is recommended every year.  Tetanus, diphtheria, and acellular pertussis (Tdap, Td) vaccine. You may need a Td booster every 10 years.  Varicella vaccine. You may need this if you have not been vaccinated.  Zoster vaccine. You may need this after age 5.  Measles, mumps, and rubella (MMR) vaccine. You may need at least one dose of MMR if you were born in  1959 or later. You may also need a second dose.  Pneumococcal 13-valent conjugate (PCV13) vaccine. You may need this if you have certain conditions and were not previously vaccinated.  Pneumococcal polysaccharide (PPSV23) vaccine. You may need one or two doses if you smoke cigarettes or if you have certain conditions.  Meningococcal vaccine. You may need this if you have certain  conditions.  Hepatitis A vaccine. You may need this if you have certain conditions or if you travel or work in places where you may be exposed to hepatitis A.  Hepatitis B vaccine. You may need this if you have certain conditions or if you travel or work in places where you may be exposed to hepatitis B.  Haemophilus influenzae type b (Hib) vaccine. You may need this if you have certain conditions.  Talk to your health care provider about which screenings and vaccines you need and how often you need them. This information is not intended to replace advice given to you by your health care provider. Make sure you discuss any questions you have with your health care provider. Document Released: 05/07/2015 Document Revised: 12/29/2015 Document Reviewed: 02/09/2015 Elsevier Interactive Patient Education  2018 [employer].  Diabetes Mellitus and Exercise Exercising regularly is important for your overall health, especially when you have diabetes (diabetes mellitus). Exercising is not only about losing weight. It has many health benefits, such as increasing muscle strength and bone density and reducing body fat and stress. This leads to improved fitness, flexibility, and endurance, all of which result in better overall health. Exercise has additional benefits for people with diabetes, including:  Reducing appetite.  Helping to lower and control blood glucose.  Lowering blood pressure.  Helping to control amounts of fatty substances (lipids) in the blood, such as cholesterol and triglycerides.  Helping the body to respond better to insulin (improving insulin sensitivity).  Reducing how much insulin the body needs.  Decreasing the risk for heart disease by: ? Lowering cholesterol and triglyceride levels. ? Increasing the levels of good cholesterol. ? Lowering blood glucose levels.  What is my activity plan? Your health care provider or certified diabetes educator can help you make a plan  for the type and frequency of exercise (activity plan) that works for you. Make sure that you:  Do at least 150 minutes of moderate-intensity or vigorous-intensity exercise each week. This could be brisk walking, biking, or water aerobics. ? Do stretching and strength exercises, such as yoga or weightlifting, at least 2 times a week. ? Spread out your activity over at least 3 days of the week.  Get some form of physical activity every day. ? Do not go more than 2 days in a row without some kind of physical activity. ? Avoid being inactive for more than 90 minutes at a time. Take frequent breaks to walk or stretch.  Choose a type of exercise or activity that you enjoy, and set realistic goals.  Start slowly, and gradually increase the intensity of your exercise over time.  What do I need to know about managing my diabetes?  Check your blood glucose before and after exercising. ? If your blood glucose is higher than 240 mg/dL (13.3 mmol/L) before you exercise, check your urine for ketones. If you have ketones in your urine, do not exercise until your blood glucose returns to normal.  Know the symptoms of low blood glucose (hypoglycemia) and how to treat it. Your risk for hypoglycemia increases during and after exercise. Common symptoms of  hypoglycemia can include: ? Hunger. ? Anxiety. ? Sweating and feeling clammy. ? Confusion. ? Dizziness or feeling light-headed. ? Increased heart rate or palpitations. ? Blurry vision. ? Tingling or numbness around the mouth, lips, or tongue. ? Tremors or shakes. ? Irritability.  Keep a rapid-acting carbohydrate snack available before, during, and after exercise to help prevent or treat hypoglycemia.  Avoid injecting insulin into areas of the body that are going to be exercised. For example, avoid injecting insulin into: ? The arms, when playing tennis. ? The legs, when jogging.  Keep records of your exercise habits. Doing this can help you and  your health care provider adjust your diabetes management plan as needed. Write down: ? Food that you eat before and after you exercise. ? Blood glucose levels before and after you exercise. ? The type and amount of exercise you have done. ? When your insulin is expected to peak, if you use insulin. Avoid exercising at times when your insulin is peaking.  When you start a new exercise or activity, work with your health care provider to make sure the activity is safe for you, and to adjust your insulin, medicines, or food intake as needed.  Drink plenty of water while you exercise to prevent dehydration or heat stroke. Drink enough fluid to keep your urine clear or pale yellow. This information is not intended to replace advice given to you by your health care provider. Make sure you discuss any questions you have with your health care provider. Document Released: 07/01/2003 Document Revised: 10/29/2015 Document Reviewed: 09/20/2015 Elsevier Interactive Patient Education  2018 [place] Eating Plan DASH stands for "Dietary Approaches to Stop Hypertension." The DASH eating plan is a healthy eating plan that has been shown to reduce high blood pressure (hypertension). It may also reduce your risk for type 2 diabetes, heart disease, and stroke. The DASH eating plan may also help with weight loss. What are tips for following this plan? General guidelines  Avoid eating more than 2,300 mg (milligrams) of salt (sodium) a day. If you have hypertension, you may need to reduce your sodium intake to 1,500 mg a day.  Limit alcohol intake to no more than 1 drink a day for nonpregnant women and 2 drinks a day for men. One drink equals 12 oz of beer, 5 oz of wine, or 1 oz of hard liquor.  Work with your health care provider to maintain a healthy body weight or to lose weight. Ask what an ideal weight is for you.  Get at least 30 minutes of exercise that causes your heart to beat faster (aerobic  exercise) most days of the week. Activities may include walking, swimming, or biking.  Work with your health care provider or diet and nutrition specialist (dietitian) to adjust your eating plan to your individual calorie needs. Reading food labels  Check food labels for the amount of sodium per serving. Choose foods with less than 5 percent of the Daily Value of sodium. Generally, foods with less than 300 mg of sodium per serving fit into this eating plan.  To find whole grains, look for the word "whole" as the first word in the ingredient list. Shopping  Buy products labeled as "low-sodium" or "no salt added."  Buy fresh foods. Avoid canned foods and premade or frozen meals. Cooking  Avoid adding salt when cooking. Use salt-free seasonings or herbs instead of table salt or sea salt. Check with your health care provider or pharmacist before  using salt substitutes.  Do not fry foods. Cook foods using healthy methods such as baking, boiling, grilling, and broiling instead.  Cook with heart-healthy oils, such as olive, canola, soybean, or sunflower oil. Meal planning   Eat a balanced diet that includes: ? 5 or more servings of fruits and vegetables each day. At each meal, try to fill half of your plate with fruits and vegetables. ? Up to 6-8 servings of whole grains each day. ? Less than 6 oz of lean meat, poultry, or fish each day. A 3-oz serving of meat is about the same size as a deck of cards. One egg equals 1 oz. ? 2 servings of low-fat dairy each day. ? A serving of nuts, seeds, or beans 5 times each week. ? Heart-healthy fats. Healthy fats called Omega-3 fatty acids are found in foods such as flaxseeds and coldwater fish, like sardines, salmon, and mackerel.  Limit how much you eat of the following: ? Canned or prepackaged foods. ? Food that is high in trans fat, such as fried foods. ? Food that is high in saturated fat, such as fatty meat. ? Sweets, desserts, sugary drinks,  and other foods with added sugar. ? Full-fat dairy products.  Do not salt foods before eating.  Try to eat at least 2 vegetarian meals each week.  Eat more home-cooked food and less restaurant, buffet, and fast food.  When eating at a restaurant, ask that your food be prepared with less salt or no salt, if possible. What foods are recommended? The items listed may not be a complete list. Talk with your dietitian about what dietary choices are best for you. Grains Whole-grain or whole-wheat bread. Whole-grain or whole-wheat pasta. Brown rice. [place]. Bulgur. Whole-grain and low-sodium cereals. Pita bread. Low-fat, low-sodium crackers. Whole-wheat flour tortillas. Vegetables Fresh or frozen vegetables (raw, steamed, roasted, or grilled). Low-sodium or reduced-sodium tomato and vegetable juice. Low-sodium or reduced-sodium tomato sauce and tomato paste. Low-sodium or reduced-sodium canned vegetables. Fruits All fresh, dried, or frozen fruit. Canned fruit in natural juice (without added sugar). Meat and other protein foods Skinless chicken or [place]. Ground chicken or [place]. Pork with fat trimmed off. Fish and seafood. Egg whites. Dried beans, peas, or lentils. Unsalted nuts, nut butters, and seeds. Unsalted canned beans. Lean cuts of beef with fat trimmed off. Low-sodium, lean deli meat. Dairy Low-fat (1%) or fat-free (skim) milk. Fat-free, low-fat, or reduced-fat cheeses. Nonfat, low-sodium ricotta or cottage cheese. Low-fat or nonfat yogurt. Low-fat, low-sodium cheese. Fats and oils Soft margarine without trans fats. Vegetable oil. Low-fat, reduced-fat, or light mayonnaise and salad dressings (reduced-sodium). Canola, safflower, olive, soybean, and sunflower oils. Avocado. Seasoning and other foods Herbs. Spices. Seasoning mixes without salt. Unsalted popcorn and pretzels. Fat-free sweets. What foods are not recommended? The items listed may not be a complete list. Talk with your  dietitian about what dietary choices are best for you. Grains Baked goods made with fat, such as croissants, muffins, or some breads. Dry pasta or rice meal packs. Vegetables Creamed or fried vegetables. Vegetables in a cheese sauce. Regular canned vegetables (not low-sodium or reduced-sodium). Regular canned tomato sauce and paste (not low-sodium or reduced-sodium). Regular tomato and vegetable juice (not low-sodium or reduced-sodium). Angie Fava. Olives. Fruits Canned fruit in a light or heavy syrup. Fried fruit. Fruit in cream or butter sauce. Meat and other protein foods Fatty cuts of meat. Ribs. Fried meat. Berniece Salines. Sausage. Bologna and other processed lunch meats. Salami. Fatback. Hotdogs. Bratwurst. Salted nuts  and seeds. Canned beans with added salt. Canned or smoked fish. Whole eggs or egg yolks. Chicken or [place] with skin. Dairy Whole or 2% milk, cream, and half-and-half. Whole or full-fat cream cheese. Whole-fat or sweetened yogurt. Full-fat cheese. Nondairy creamers. Whipped toppings. Processed cheese and cheese spreads. Fats and oils Butter. Stick margarine. Lard. Shortening. Ghee. Bacon fat. Tropical oils, such as coconut, palm kernel, or palm oil. Seasoning and other foods Salted popcorn and pretzels. Onion salt, garlic salt, seasoned salt, table salt, and sea salt. Worcestershire sauce. Tartar sauce. Barbecue sauce. Teriyaki sauce. Soy sauce, including reduced-sodium. Steak sauce. Canned and packaged gravies. Fish sauce. Oyster sauce. Cocktail sauce. Horseradish that you find on the shelf. Ketchup. Mustard. Meat flavorings and tenderizers. Bouillon cubes. Hot sauce and Tabasco sauce. Premade or packaged marinades. Premade or packaged taco seasonings. Relishes. Regular salad dressings. Where to find more information:  National Heart, Lung, and [place]: https://wilson-eaton.com/  American Heart Association: www.heart.org Summary  The DASH eating plan is a healthy eating plan that has  been shown to reduce high blood pressure (hypertension). It may also reduce your risk for type 2 diabetes, heart disease, and stroke.  With the DASH eating plan, you should limit salt (sodium) intake to 2,300 mg a day. If you have hypertension, you may need to reduce your sodium intake to 1,500 mg a day.  When on the DASH eating plan, aim to eat more fresh fruits and vegetables, whole grains, lean proteins, low-fat dairy, and heart-healthy fats.  Work with your health care provider or diet and nutrition specialist (dietitian) to adjust your eating plan to your individual calorie needs. This information is not intended to replace advice given to you by your health care provider. Make sure you discuss any questions you have with your health care provider. Document Released: 03/30/2011 Document Revised: 04/03/2016 Document Reviewed: 04/03/2016 Elsevier Interactive Patient Education  [employer].

## 2017-07-24 ENCOUNTER — Encounter: Payer: Self-pay | Admitting: Family Medicine

## 2017-07-27 ENCOUNTER — Other Ambulatory Visit: Payer: Self-pay | Admitting: Family Medicine

## 2017-07-27 DIAGNOSIS — Z139 Encounter for screening, unspecified: Secondary | ICD-10-CM

## 2017-08-17 ENCOUNTER — Ambulatory Visit: Payer: BLUE CROSS/BLUE SHIELD

## 2017-09-10 ENCOUNTER — Ambulatory Visit
Admission: RE | Admit: 2017-09-10 | Discharge: 2017-09-10 | Disposition: A | Discharge status: Home / Self Care | Payer: BLUE CROSS/BLUE SHIELD | Source: Ambulatory Visit | Attending: Family Medicine | Admitting: Family Medicine

## 2017-09-10 DIAGNOSIS — Z139 Encounter for screening, unspecified: Secondary | ICD-10-CM

## 2017-10-08 ENCOUNTER — Other Ambulatory Visit: Payer: Self-pay | Admitting: Internal Medicine

## 2017-10-22 ENCOUNTER — Ambulatory Visit (INDEPENDENT_AMBULATORY_CARE_PROVIDER_SITE_OTHER): Payer: BLUE CROSS/BLUE SHIELD | Admitting: Internal Medicine

## 2017-10-22 ENCOUNTER — Encounter: Payer: Self-pay | Admitting: Internal Medicine

## 2017-10-22 VITALS — BP 120/60 | HR 76 | Ht 63.5 in | Wt 192.8 lb

## 2017-10-22 DIAGNOSIS — E785 Hyperlipidemia, unspecified: Secondary | ICD-10-CM

## 2017-10-22 DIAGNOSIS — E1165 Type 2 diabetes mellitus with hyperglycemia: Secondary | ICD-10-CM

## 2017-10-22 DIAGNOSIS — E039 Hypothyroidism, unspecified: Secondary | ICD-10-CM

## 2017-10-22 LAB — POCT GLYCOSYLATED HEMOGLOBIN (HGB A1C): Hemoglobin A1C: 6.6 % — AB (ref 4.0–5.6)

## 2017-10-22 NOTE — Progress Notes (Signed)
Patient ID: Rowe Pavy Cappelli, female   DOB: 12/13/1960, 57 y.o.   MRN: [id number]  HPI: Kateleen Encarnacion Kilian is a 57 y.o.-year-old female, returning for f/u for DM2, dx 2008, non-insulin-dependent, uncontrolled, without long term complications. Last visit 7.5 months ago.  Last hemoglobin A1c was: 03/13/2017: HbA1c calculated from fructosamine 6.16% Lab Results  Component Value Date   HGBA1C 7.7 (H) 07/19/2017   HGBA1C 7.2 03/13/2017   HGBA1C 7.0 12/11/2016   Pt is on a regimen of: - Metformin 1000 mg 2x a day with meals - Glipizide ER 2.5 mg before breakfast  1 crushed Glipizide ER 2.5 mg before dinner She could not afford Januvia and Tradjenta. We stopped Amaryl 2 mg in 03/2013 as her sugars were at goal and even had some lows and got lightheaded 4 h after b'fast, while at work (resolved after having a snack).   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 Highest sugars: 201>> 180  Meter: ReliOn  Pt's meals are: - Breakfast (4 am): 3 strips [place] bacon + 1 egg + coffee - splenda and cream - snack: banana, nuts - Lunch: PB sandwich + bag of chips + yoghurt + fruit + diet Mtn Dew - snack: fruit and nuts - Dinner: sauerkraut + sausage  She works starting at 4 am. She wakes up at 2 am Goes to bed around 8 p.m.  -No CKD, last BUN/creatinine:  Lab Results  Component Value Date   BUN 10 07/19/2017   CREATININE 0.56 07/19/2017  On lisinopril. -+ HL; last set of lipids: Lab Results  Component Value Date   CHOL 156 07/19/2017   HDL 61.80 07/19/2017   LDLCALC 83 07/19/2017   LDLDIRECT 66.0 07/13/2014   TRIG 56.0 07/19/2017   CHOLHDL 3 07/19/2017  On Lipitor. - last eye exam was in 10/2016: No DR. Dr Lady Gary. - No numbness and tingling in her feet. She saw a podiatrist.  She has controlled hypothyroidism: Lab Results  Component Value Date   TSH 0.48 07/19/2017   Pt is on levothyroxine 75 mcg daily, taken: - in am - fasting - at least 30 min from b'fast - no Ca, Fe, MVI, PPIs - not on Biotin  Pt denies: - feeling nodules in neck - hoarseness - dysphagia - choking - SOB with lying down  Last TSH normal: Lab Results  Component Value Date   TSH 0.48 07/19/2017   ROS: Constitutional: no weight gain/no weight loss, no fatigue, no subjective hyperthermia, no subjective hypothermia Eyes: no blurry vision, no xerophthalmia ENT: no sore throat, + see HPI Cardiovascular: no CP/no SOB/no palpitations/no leg swelling Respiratory: no cough/no SOB/no wheezing Gastrointestinal: no N/no V/no D/no C/no acid reflux Musculoskeletal: no muscle aches/no joint aches, + leg pain Skin: no rashes, no hair loss Neurological: no tremors/no numbness/no tingling/no dizziness  I reviewed pt's medications, allergies, PMH, social hx, family hx, and changes were documented in the  history of present illness. Otherwise, unchanged from my initial visit note.  Past Medical History:  Diagnosis Date  . DIABETES MELLITUS, TYPE II 11/13/2006  . HYPERLIPIDEMIA 11/13/2006  . HYPERTENSION 05/19/2007  . HYPOTHYROIDISM 11/13/2006  . TOBACCO USE 12/16/2007   Quit 04/23/10     Past Surgical History:  Procedure Laterality Date  . CESAREAN SECTION    . TONSILLECTOMY  1970  . TRIGGER FINGER RELEASE     around 2012   Social History   Socioeconomic History  . Marital status: Divorced    Spouse name: Not on file  . Number of children: Not on file  . Years of education: Not on file  . Highest education level: Not on file  Occupational History  . Not on  file  Social Needs  . Financial resource strain: Not on file  . Food insecurity:    Worry: Not on file    Inability: Not on file  . Transportation needs:    Medical: Not on file    Non-medical: Not on file  Tobacco Use  . Smoking status: Former Smoker    Packs/day: 1.00    Years: 30.00    Pack years: 30.00    Types: Cigarettes    Last attempt to quit: 04/23/2010    Years since quitting: 7.5  . Smokeless tobacco: Never Used  Substance and Sexual Activity  . Alcohol use: No    Alcohol/week: 0.0 oz  . Drug use: No  . Sexual activity: Yes    Partners: Male  Lifestyle  . Physical activity:    Days per week: Not on file    Minutes per session: Not on file  . Stress: Not on file  Relationships  . Social connections:    Talks on phone: Not on file    Gets together: Not on file    Attends religious service: Not on file    Active member of club or organization: Not on file    Attends meetings of clubs or organizations: Not on file    Relationship status: Not on file  . Intimate partner violence:    Fear of current or ex partner: Not on file    Emotionally abused: Not on file    Physically abused: Not on file    Forced sexual activity: Not on file  Other Topics Concern  . Not on file  Social History Narrative   New relationship.    In process of divorce (starting 2015- still going 2018). 2 children- boy 50 autism goes to [facility] and works and girl 39 works at the jail in 2016. Son lives with her.  Husband no longer in house. Daughter in [place].       Works at [employer]- 24 years in May 2018.       Hobbies: tv, facebook/internet. Church.       Regular exercise: not at this time   Caffeine use: Diet Mt Dew   Current Outpatient Medications on File Prior to Visit  Medication Sig Dispense Refill  . atorvastatin (LIPITOR) 10 MG tablet TAKE 1 TABLET BY MOUTH ONCE DAILY 90 tablet 3  . glipiZIDE (GLUCOTROL XL) 2.5 MG 24 hr tablet TAKE 1 WHOLE TABLET BY MOUTH BEFORE BREAKFAST AND 1  CRUSHED TABLET BY MOUTH BEFORE DINNER 180 tablet 3  . levothyroxine (SYNTHROID, LEVOTHROID) 75 MCG tablet TAKE 1 TABLET BY MOUTH ONCE DAILY 37 tablet 9  . lisinopril (PRINIVIL,ZESTRIL) 5 MG tablet TAKE 1 TABLET BY MOUTH ONCE DAILY 90 tablet 3  .  metFORMIN (GLUCOPHAGE) 1000 MG tablet TAKE 1 TABLET BY MOUTH TWICE DAILY WITH MEALS 180 tablet 1   No current facility-administered medications on file prior to visit.    No Known Allergies Family History  Problem Relation Age of Onset  . Diabetes Mother   . Heart disease Father   . Kidney disease Father   . Prostate cancer Father   . Breast cancer Sister        age 67  . Diabetes Brother   . Diabetes Brother     PE: BP 120/60   Pulse 76   Ht 5' 3.5" (1.613 m)   Wt 192 lb 12.8 oz (87.5 kg)   LMP 12/20/2011   SpO2 98%   BMI 33.62 kg/m  Body mass index is 33.62 kg/m.  Wt Readings from Last 3 Encounters:  10/22/17 192 lb 12.8 oz (87.5 kg)  07/19/17 195 lb (88.5 kg)  03/13/17 196 lb (88.9 kg)   Constitutional: overweight, in NAD Eyes: PERRLA, EOMI, no exophthalmos ENT: moist mucous membranes, no thyromegaly, no cervical lymphadenopathy Cardiovascular: RRR, No MRG Respiratory: CTA B Gastrointestinal: abdomen soft, NT, ND, BS+ Musculoskeletal: no deformities, strength intact in all 4 Skin: moist, warm, no rashes Neurological: no tremor with outstretched hands, DTR normal in all 4  ASSESSMENT: 1. DM2, non-insulin-dependent, uncontrolled, without long term complications, but with occasional hyperglycemia -The Freestyle libre CGM was not covered by her insurance  2. Hypothyroidism  3. HL  PLAN:  1. Patient with fairly good control of her type 2 diabetes, on the simple, oral, medication regimen.  At last visit, she had more hyperglycemic spikes in blood sugars after dinner were in the 200s; we did not change the regimen at that time but she was taking glipizide without a relationship with her meals and I advised her to move this  15-30 minutes before a meal and metformin after the meal. -  at this visit, sugars appear approximately the same as before, with slightly higher sugars after lunch (up to 180), but usually only if she buys food while driving home afterwards.  If she eats w the food that she takes with her to work, her sugars are not that high. - We will not change her regimen today - I suggested to:  Patient Instructions  Please continue: - Metformin 1000 mg 2x a day - Glipizide ER 2.5 mg in am, before b'fast  + 2.5 mg  crushed tablet before dinner  Please return in 4 months with your sugar log.   - today, HbA1c is 6.6% (better) - will not check a fructosamine level -  - continue checking sugars at different times of the day - check 1x a day, rotating checks - advised for yearly eye exams >> she is UTD - Return to clinic in 4 mo with sugar log    2. Hypothyroidism -Controlled - latest thyroid labs reviewed with pt >> normal 06/2017 - she continues on LT4 75 mcg daily - pt feels good on this dose. - we discussed about taking the thyroid hormone every day, with water, >30 minutes before breakfast, separated by >4 hours from acid reflux medications, calcium, iron, multivitamins. Pt. is taking it correctly.  3. HL - Reviewed latest lipid panel from 06/2017: LDL higher, but still at goal: - Continues Lipitor without side effects.  Philemon Kingdom, MD PhD [facility]

## 2017-10-22 NOTE — Addendum Note (Signed)
Addended by: Drucilla Schmidt on: 10/22/2017 04:35 PM   Modules accepted: Orders

## 2017-10-22 NOTE — Patient Instructions (Signed)
Please continue: - Metformin 1000 mg 2x a day - Glipizide ER 2.5 mg in am, before b'fast  + 2.5 mg  crushed tablet before dinner  Please return in 4 months with your sugar log.

## 2017-11-12 ENCOUNTER — Encounter: Payer: Self-pay | Admitting: Internal Medicine

## 2017-11-12 LAB — HM DIABETES EYE EXAM

## 2017-11-13 ENCOUNTER — Ambulatory Visit (INDEPENDENT_AMBULATORY_CARE_PROVIDER_SITE_OTHER): Payer: BLUE CROSS/BLUE SHIELD | Admitting: Family Medicine

## 2017-11-13 ENCOUNTER — Encounter: Payer: Self-pay | Admitting: Family Medicine

## 2017-11-13 VITALS — BP 120/56 | HR 82 | Temp 98.8°F | Wt 189.0 lb

## 2017-11-13 DIAGNOSIS — M7632 Iliotibial band syndrome, left leg: Secondary | ICD-10-CM

## 2017-11-13 DIAGNOSIS — S51811A Laceration without foreign body of right forearm, initial encounter: Secondary | ICD-10-CM

## 2017-11-13 MED ORDER — CYCLOBENZAPRINE HCL 5 MG PO TABS: 5.0000 mg | ORAL_TABLET | Freq: Every evening | ORAL | 0 refills | Status: DC | PRN

## 2017-11-13 NOTE — Progress Notes (Signed)
Subjective:    Patient ID: Maria Hammond, female    DOB: 12-14-1960, 57 y.o.   MRN: [id number]  No chief complaint on file.   HPI Patient was seen today for ongoing concern.  Patient endorses pain in the left lateral thigh x1 month.  Symptoms initially started with left lower back pain which turned into leg pain.  Back pain is now resolved.  Pt endorse pain when sitting, but feels better when standing.  At times difficult to get comfortable at night.  Pt has tried ibuprofen and OTC creams.  Pt denies recent injury.  At the end of visit patient mentions a cut on her R forearm covered with a large band aid.  Pt states she cut herself on a piece of glass after a picture frame broke today.  Injury occurred 4 hours prior to OFV.  Tetanus up to date, done 10/15/2008.  Past Medical History:  Diagnosis Date  . DIABETES MELLITUS, TYPE II 11/13/2006  . HYPERLIPIDEMIA 11/13/2006  . HYPERTENSION 05/19/2007  . HYPOTHYROIDISM 11/13/2006  . TOBACCO USE 12/16/2007   Quit 04/23/10      No Known Allergies  ROS General: Denies fever, chills, night sweats, changes in weight, changes in appetite HEENT: Denies headaches, ear pain, changes in vision, rhinorrhea, sore throat CV: Denies CP, palpitations, SOB, orthopnea Pulm: Denies SOB, cough, wheezing GI: Denies abdominal pain, nausea, vomiting, diarrhea, constipation GU: Denies dysuria, hematuria, frequency, vaginal discharge Msk: Denies muscle cramps, joint pains  +L lateral thigh pain/numbness Neuro: Denies weakness, numbness, tingling Skin: Denies rashes, bruising  +laceration on R forearm Psych: Denies depression, anxiety, hallucinations     Objective:    Blood pressure (!) 120/56, pulse 82, temperature 98.8 F (37.1 C), temperature source Oral, weight 189 lb (85.7 kg), last menstrual period 12/20/2011, SpO2 97 %.   Gen. Pleasant, well-nourished, in no distress, normal affect   HEENT: [place]/AT, face symmetric, no scleral icterus, PERRLA, nares patent  without drainage Lungs: no accessory muscle use, CTAB, no wheezes or rales Cardiovascular: RRR, no m/r/g, no peripheral edema Musculoskeletal: No deformities, no cyanosis or clubbing, normal tone.  TTP of L lateral thigh from hip to knee.  No TTP of spine or paraspinal muscles.  Negative straight leg raise, log roll, and abduction of leg. Neuro:  A&Ox3, CN II-XII intact, normal gait Skin:  Warm, no lesions/ rash  Well saturated bandage removed, bleeding 5.5 cm laceration of R posterior forearm.  See procedure note below   Wt Readings from Last 3 Encounters:  11/13/17 189 lb (85.7 kg)  10/22/17 192 lb 12.8 oz (87.5 kg)  07/19/17 195 lb (88.5 kg)    Lab Results  Component Value Date   WBC 8.3 07/19/2017   HGB 13.1 07/19/2017   HCT 39.4 07/19/2017   PLT 269.0 07/19/2017   GLUCOSE 106 (H) 07/19/2017   CHOL 156 07/19/2017   TRIG 56.0 07/19/2017   HDL 61.80 07/19/2017   LDLDIRECT 66.0 07/13/2014   LDLCALC 83 07/19/2017   ALT 13 07/10/2016   AST 10 07/10/2016   NA 139 07/19/2017   K 3.9 07/19/2017   CL 103 07/19/2017   CREATININE 0.56 07/19/2017   BUN 10 07/19/2017   CO2 30 07/19/2017   TSH 0.48 07/19/2017   HGBA1C 6.6 (A) 10/22/2017   MICROALBUR 6.2 (H) 07/10/2016    Assessment/Plan:  It band syndrome, left  -given handout and exercises - Plan: cyclobenzaprine (FLEXERIL) 5 MG tablet  Laceration of right forearm, initial encounter -tetanus up-to-date.  Laceration  Repair Note: 5.5 cm laceration of R posterior forearm  Anesthesia with 1% Lidocaine without Epinephrine. Wound cleansed, debrided of visible foreign material and necrotic tissue, and sutured. 6 sutures using 4-0 vicryl placed.  Dressing applied.  Wound care instructions provided.  Observe for any signs of infection or other problems.  Return for suture removal in 10-14 days.  Appt made 11/26/17 at 1:30 pm for suture removal.   More than 50% of over 25 minutes spent in total in caring for this patient was spent  face-to-face with the patient, counseling and/or coordinating care.   Grier Mitts, MD

## 2017-11-13 NOTE — Patient Instructions (Addendum)
Need to return to clinic in 10 to 14 days to have your sutures removed.   Iliotibial Band Syndrome Iliotibial band syndrome (ITBS) is a condition that often causes knee pain. It can also cause pain in the outside of your hip, thigh, and knee. The iliotibial band is a strip of tissue that runs from the outside of your hip and down your thigh to the outside of your knee. Repeatedly bending and straightening your knee can irritate the iliotibial band. What are the causes? This condition is caused by inflammation and irritation from the friction of the iliotibial band moving over the thigh bone (femur) when you repeatedly bend and straighten your knee. What increases the risk? This condition is more likely to develop in people who:  Frequently change elevation during their workouts.  Run very long distances.  Recently increased the length or intensity of their workouts.  Run downhill often, or just started running downhill.  Ride a bike very far or often.  You may also be at greater risk if you start a new workout routine without first warming up or if you have a job that requires you to bend, squat, or climb frequently. What are the signs or symptoms? Symptoms of this condition include:  Pain along the outside of your knee that may be worse with activity, especially running or going up and down stairs.  A "snapping" sensation over your knee.  Swelling on the outside of your knee.  Pain or a feeling of tightness in your hip.  How is this diagnosed? This condition is diagnosed based on your symptoms, medical history, and physical exam. You may also see a health care provider who specializes in reducing pain and increasing mobility (physical therapist). A physical therapist may do an exam to check your balance, movement, and way of walking or running (gait) to see whether the way you move could contribute to your injury. You may also have tests to measure your strength, flexibility, and  range of motion. How is this treated? Treatment for this condition includes:  Resting and limiting exercise.  Returning to activities gradually.  Doing range-of-motion and strengthening exercises (physical therapy) as told by your health care provider.  Including low-impact activities, such as swimming, in your exercise routine.  Follow these instructions at home:  If directed, apply ice to the injured area. ? Put ice in a plastic bag. ? Place a towel between your skin and the bag. ? Leave the ice on for 20 minutes, 2-3 times per day.  Return to your normal activities as told by your health care provider. Ask your health care provider what activities are safe for you.  Keep all follow-up visits with your health care provider. This is important. Contact a health care provider if:  Your pain does not improve or gets worse despite treatment. This information is not intended to replace advice given to you by your health care provider. Make sure you discuss any questions you have with your health care provider. Document Released: 09/30/2001 Document Revised: 05/12/2016 Document Reviewed: 05/12/2016 Elsevier Interactive Patient Education  2018 [place] Band Syndrome Rehab Ask your health care provider which exercises are safe for you. Do exercises exactly as told by your health care provider and adjust them as directed. It is normal to feel mild stretching, pulling, tightness, or discomfort as you do these exercises, but you should stop right away if you feel sudden pain or your pain gets worse.Do not begin these exercises until  told by your health care provider. Stretching and range of motion exercises These exercises warm up your muscles and joints and improve the movement and flexibility of your hip and pelvis. Exercise A: Quadriceps, prone  1. Lie on your abdomen on a firm surface, such as a bed or padded floor. 2. Bend your left / right knee and hold your ankle.  If you cannot reach your ankle or pant leg, loop a belt around your foot and grab the belt instead. 3. Gently pull your heel toward your buttocks. Your knee should not slide out to the side. You should feel a stretch in the front of your thigh and knee. 4. Hold this position for __________ seconds. Repeat __________ times. Complete this stretch __________ times a day. Exercise B: Iliotibial band  1. Lie on your side with your left / right leg in the top position. 2. Bend both of your knees and grab your left / right ankle. Stretch out your bottom arm to help you balance. 3. Slowly bring your top knee back so your thigh goes behind your trunk. 4. Slowly lower your top leg toward the floor until you feel a gentle stretch on the outside of your left / right hip and thigh. If you do not feel a stretch and your knee will not fall farther, place the heel of your other foot on top of your knee and pull your knee down toward the floor with your foot. 5. Hold this position for __________ seconds. Repeat __________ times. Complete this stretch __________ times a day. Strengthening exercises These exercises build strength and endurance in your hip and pelvis. Endurance is the ability to use your muscles for a long time, even after they get tired. Exercise C: Straight leg raises ( hip abductors) 1. Lie on your side with your left / right leg in the top position. Lie so your head, shoulder, knee, and hip line up. You may bend your bottom knee to help you balance. 2. Roll your hips slightly forward so your hips are stacked directly over each other and your left / right knee is facing forward. 3. Tense the muscles in your outer thigh and lift your top leg 4-6 inches (10-15 cm). 4. Hold this position for __________ seconds. 5. Slowly return to the starting position. Let your muscles relax completely before doing another repetition. Repeat __________ times. Complete this exercise __________ times a  day. Exercise D: Straight leg raises ( hip extensors) 1. Lie on your abdomen on your bed or a firm surface. You can put a pillow under your hips if that is more comfortable. 2. Bend your left / right knee so your foot is straight up in the air. 3. Squeeze your buttock muscles and lift your left / right thigh off the bed. Do not let your back arch. 4. Tense this muscle as hard as you can without increasing any knee pain. 5. Hold this position for __________ seconds. 6. Slowly lower your leg to the starting position and allow it to relax completely. Repeat __________ times. Complete this exercise __________ times a day. Exercise E: Hip hike 1. Stand sideways on a bottom step. Stand on your left / right leg with your other foot unsupported next to the step. You can hold onto the railing or wall if needed for balance. 2. Keep your knees straight and your torso square. Then, lift your left / right hip up toward the ceiling. 3. Slowly let your left / right hip lower toward  the floor, past the starting position. Your foot should get closer to the floor. Do not lean or bend your knees. Repeat __________ times. Complete this exercise __________ times a day. This information is not intended to replace advice given to you by your health care provider. Make sure you discuss any questions you have with your health care provider. Document Released: 04/10/2005 Document Revised: 12/14/2015 Document Reviewed: 03/12/2015 Elsevier Interactive Patient Education  2018 [place] Sutures are stitches that can be used to close wounds. Taking care of your wound properly can help to prevent pain and infection. It can also help your wound to heal more quickly. How is this treated? Wound Care  Keep the wound clean and dry.  If you were given a bandage (dressing), you should change it at least once per day or as directed by your health care provider. You should also change it if it becomes wet  or dirty.  Keep the wound completely dry for the first 24 hours or as directed by your health care provider. After that time, you may shower or bathe. However, make sure that the wound is not soaked in water until the sutures have been removed.  Clean the wound one time each day or as directed by your health care provider. ? Wash the wound with soap and water. ? Rinse the wound with water to remove all soap. ? Pat the wound dry with a clean towel. Do not rub the wound.  Aftercleaning the wound, apply a thin layer of antibioticointment as directed by your health care provider. This will help to prevent infection and keep the dressing from sticking to the wound.  Have the sutures removed as directed by your health care provider. General Instructions  Take or apply medicines only as directed by your health care provider.  To help prevent scarring, make sure to cover your wound with sunscreen whenever you are outside after the sutures are removed and the wound is healed. Make sure to wear a sunscreen of at least 30 SPF.  If you were prescribed an antibiotic medicine or ointment, finish all of it even if you start to feel better.  Do not scratch or pick at the wound.  Keep all follow-up visits as directed by your health care provider. This is important.  Check your wound every day for signs of infection. Watch for: ? Redness, swelling, or pain. ? Fluid, blood, or pus.  Raise (elevate) the injured area above the level of your heart while you are sitting or lying down, if possible.  Avoid stretching your wound.  Drink enough fluids to keep your urine clear or pale yellow. Contact a health care provider if:  You received a tetanus shot and you have swelling, severe pain, redness, or bleeding at the injection site.  You have a fever.  A wound that was closed breaks open.  You notice a bad smell coming from the wound.  You notice something coming out of the wound, such as wood or  glass.  Your pain is not controlled with medicine.  You have increased redness, swelling, or pain at the site of your wound.  You have fluid, blood, or pus coming from your wound.  You notice a change in the color of your skin near your wound.  You need to change the dressing frequently due to fluid, blood, or pus draining from the wound.  You develop a new rash.  You develop numbness around the wound. Get  help right away if:  You develop severe swelling around the injury site.  Your pain suddenly increases and is severe.  You develop painful lumps near the wound or on skin that is anywhere on your body.  You have a red streak going away from your wound.  The wound is on your hand or foot and you cannot properly move a finger or toe.  The wound is on your hand or foot and you notice that your fingers or toes look pale or bluish. This information is not intended to replace advice given to you by your health care provider. Make sure you discuss any questions you have with your health care provider. Document Released: 05/18/2004 Document Revised: 09/16/2015 Document Reviewed: 11/20/2012 Elsevier Interactive Patient Education  2017 [employer].

## 2017-11-26 ENCOUNTER — Ambulatory Visit (INDEPENDENT_AMBULATORY_CARE_PROVIDER_SITE_OTHER): Payer: BLUE CROSS/BLUE SHIELD | Admitting: Family Medicine

## 2017-11-26 ENCOUNTER — Encounter: Payer: Self-pay | Admitting: Family Medicine

## 2017-11-26 VITALS — BP 120/58 | HR 80 | Temp 99.1°F | Wt 189.0 lb

## 2017-11-26 DIAGNOSIS — Z4802 Encounter for removal of sutures: Secondary | ICD-10-CM

## 2017-11-26 NOTE — Progress Notes (Signed)
Subjective:    Maria Hammond is a 57 y.o. female who obtained a laceration on 11/13/17, which required closure with 6 sutures. Mechanism of injury: cut from a piece of glass. She denies pain, redness, or drainage from the wound. Her last tetanus was on 10/15/2008.  Pt notes one suture came out.  Pt notes her L leg pain has not improved.  She states she will make a f/u appt to further discuss it.  The following portions of the patient's history were reviewed and updated as appropriate: allergies, current medications, past family history, past medical history, past social history, past surgical history and problem list.  Review of Systems A comprehensive review of systems was negative.   Objective:    BP (!) 120/58 (BP Location: Left Arm, Patient Position: Sitting, Cuff Size: Normal)   Pulse 80   Temp 99.1 F (37.3 C) (Oral)   Wt 189 lb (85.7 kg)   LMP 12/20/2011   SpO2 96%   BMI 32.95 kg/m  Injury exam:  A 5.5 cm laceration noted on the right posterior forearm is healing well, with evidence of infection.    Assessment:    Laceration is healing well, without evidence of infection.    Plan:     1. 5 sutures were removed, as one came out prior to appointment. 2. Wound care discussed.  Given handout 3. Follow up as needed.   Grier Mitts, MD

## 2017-11-26 NOTE — Patient Instructions (Signed)
Suture Removal, Care After Refer to this sheet in the next few weeks. These instructions provide you with information on caring for yourself after your procedure. Your health care provider may also give you more specific instructions. Your treatment has been planned according to current medical practices, but problems sometimes occur. Call your health care provider if you have any problems or questions after your procedure. What can I expect after the procedure? After your stitches (sutures) are removed, it is typical to have the following:  Some discomfort and swelling in the wound area.  Slight redness in the area.  Follow these instructions at home:  If you have skin adhesive strips over the wound area, do not take the strips off. They will fall off on their own in a few days. If the strips remain in place after 14 days, you may remove them.  Change any bandages (dressings) at least once a day or as directed by your health care provider. If the bandage sticks, soak it off with warm, soapy water.  Apply cream or ointment only as directed by your health care provider. If using cream or ointment, wash the area with soap and water 2 times a day to remove all the cream or ointment. Rinse off the soap and pat the area dry with a clean towel.  Keep the wound area dry and clean. If the bandage becomes wet or dirty, or if it develops a bad smell, change it as soon as possible.  Continue to protect the wound from injury.  Use sunscreen when out in the sun. New scars become sunburned easily. Contact a health care provider if:  You have increasing redness, swelling, or pain in the wound.  You see pus coming from the wound.  You have a fever.  You notice a bad smell coming from the wound or dressing.  Your wound breaks open (edges not staying together). This information is not intended to replace advice given to you by your health care provider. Make sure you discuss any questions you have  with your health care provider. Document Released: 01/03/2001 Document Revised: 09/16/2015 Document Reviewed: 11/20/2012 Elsevier Interactive Patient Education  2017 [employer] Inc[employer]  

## 2017-12-25 ENCOUNTER — Telehealth: Payer: Self-pay | Admitting: Family Medicine

## 2017-12-25 NOTE — Telephone Encounter (Signed)
Copied from [place] [id number]. Topic: Referral - Request >> Dec 25, 2017  1:42 PM Scherrie Gerlach wrote: Reason for CRM: pt saw Dr Volanda Napoleon last 8/05 for leg pain.  Pt still has the pain.  Requesting referral to a specialist. Ok for message by phone and / or mychart

## 2017-12-26 NOTE — Telephone Encounter (Signed)
Please advise if ok to place referral  

## 2017-12-27 NOTE — Telephone Encounter (Signed)
Okay to place referral? And order for x-ray?

## 2017-12-27 NOTE — Telephone Encounter (Signed)
At last OFV pt mentioned lateral R thigh pain.  If this is the case ok to get R hip xray.  Will wait on referral until after imaging.  Also pt was suppose to schedule a f/u for her hip.

## 2017-12-27 NOTE — Telephone Encounter (Signed)
Copied from [place] [id number]. Topic: Referral - Request >> Dec 27, 2017  8:46 AM Bea Graff, NT wrote: Pt checking status on her referral. She also states if the xray can be done in office she will be willing to do that. May send follow-up message through mychart.

## 2017-12-27 NOTE — Telephone Encounter (Signed)
Ok to refer to sports med.

## 2017-12-28 NOTE — Telephone Encounter (Signed)
Called pt left a message for pt to return my call in the office

## 2017-12-31 NOTE — Telephone Encounter (Signed)
Patient returned call. Follow-up appointment scheduled for 9/13. Patient states it is really her whole leg that is hurting, most severe pain is from knee to ankle. She wants to know if she needs more extensive x-rays than just the hip. Patient aware PCP is out of the office and will return Wednesday. Please advise.

## 2018-01-01 NOTE — Telephone Encounter (Signed)
Will address at [facility] on 9/13.

## 2018-01-01 NOTE — Telephone Encounter (Signed)
Called pt left a detailed message with dr Volanda Napoleon recommendation.

## 2018-01-02 NOTE — Telephone Encounter (Signed)
Left a detailed message on pt Voicemail that dr Volanda Napoleon will address her concerns during her appointment on Friday 01/04/2018

## 2018-01-04 ENCOUNTER — Encounter: Payer: Self-pay | Admitting: Family Medicine

## 2018-01-04 ENCOUNTER — Ambulatory Visit (INDEPENDENT_AMBULATORY_CARE_PROVIDER_SITE_OTHER)
Admission: RE | Admit: 2018-01-04 | Discharge: 2018-01-04 | Disposition: A | Discharge status: Home / Self Care | Payer: BLUE CROSS/BLUE SHIELD | Source: Ambulatory Visit | Attending: Family Medicine | Admitting: Family Medicine

## 2018-01-04 ENCOUNTER — Ambulatory Visit (INDEPENDENT_AMBULATORY_CARE_PROVIDER_SITE_OTHER): Payer: BLUE CROSS/BLUE SHIELD | Admitting: Family Medicine

## 2018-01-04 VITALS — BP 112/68 | HR 80 | Temp 98.9°F | Wt 185.0 lb

## 2018-01-04 DIAGNOSIS — M79605 Pain in left leg: Secondary | ICD-10-CM

## 2018-01-04 DIAGNOSIS — G6289 Other specified polyneuropathies: Secondary | ICD-10-CM

## 2018-01-04 MED ORDER — GABAPENTIN 100 MG PO CAPS: 100.0000 mg | ORAL_CAPSULE | Freq: Three times a day (TID) | ORAL | 1 refills | Status: DC

## 2018-01-04 MED ORDER — MELOXICAM 7.5 MG PO TABS: 7.5000 mg | ORAL_TABLET | Freq: Every day | ORAL | 0 refills | Status: DC

## 2018-01-04 NOTE — Progress Notes (Signed)
Subjective:    Patient ID: Rowe Pavy Gashi, female    DOB: [birth year], 57 y.o.   MRN: [id number]  No chief complaint on file.   HPI Patient was seen today for follow-up on left leg pain.  Pt endorses continued leg pain which started in July.  Initially pain in left low back pain/hip.  Pt seen on 7/23 for left lateral thigh pain, given flexeril- which made pt feel groggy.  Pt now with intense pain below L knee into top and sides of L foot.  Pt denies hearing any pops, clicks, or tears, no recent injury.  Pt notes difficulty bending knee at times.  Pain worse since last OFV.  Denies edema.  Pt also notes h/o neuropathy.  Pt has never been on any meds for this.  Past Medical History:  Diagnosis Date  . DIABETES MELLITUS, TYPE II 11/13/2006  . HYPERLIPIDEMIA 11/13/2006  . HYPERTENSION 05/19/2007  . HYPOTHYROIDISM 11/13/2006  . TOBACCO USE 12/16/2007   Quit 04/23/10      No Known Allergies  ROS General: Denies fever, chills, night sweats, changes in weight, changes in appetite HEENT: Denies headaches, ear pain, changes in vision, rhinorrhea, sore throat CV: Denies CP, palpitations, SOB, orthopnea Pulm: Denies SOB, cough, wheezing GI: Denies abdominal pain, nausea, vomiting, diarrhea, constipation GU: Denies dysuria, hematuria, frequency, vaginal discharge Msk: Denies muscle cramps, joint pains  +LLE pain from knee into top and sides of foot Neuro: Denies weakness, numbness, tingling Skin: Denies rashes, bruising Psych: Denies depression, anxiety, hallucinations    Objective:    Last menstrual period 12/20/2011.   Gen. Pleasant, well-nourished, in no distress, normal affect   Lungs: no accessory muscle use Cardiovascular: RRR, no peripheral edema Musculoskeletal: TTP of L knee media joint line.  No edema or effusion noted.  No crepitus in L knee.  Exam difficult as pt resistant to passive motion.  Negative logroll, straight leg raise, FADIR, Faber bilaterally.  No knee instability  bilaterally.  R knee with crepitus.  No deformities, no cyanosis or clubbing, normal tone.  Strength in lower extremities bilaterally 5/5 right slightly greater than left.  No calf tenderness bilaterally. Neuro:  A&Ox3, CN II-XII intact, ambulating with a limp Skin:  Warm, no lesions/ rash   Wt Readings from Last 3 Encounters:  11/26/17 189 lb (85.7 kg)  11/13/17 189 lb (85.7 kg)  10/22/17 192 lb 12.8 oz (87.5 kg)    Lab Results  Component Value Date   WBC 8.3 07/19/2017   HGB 13.1 07/19/2017   HCT 39.4 07/19/2017   PLT 269.0 07/19/2017   GLUCOSE 106 (H) 07/19/2017   CHOL 156 07/19/2017   TRIG 56.0 07/19/2017   HDL 61.80 07/19/2017   LDLDIRECT 66.0 07/13/2014   LDLCALC 83 07/19/2017   ALT 13 07/10/2016   AST 10 07/10/2016   NA 139 07/19/2017   K 3.9 07/19/2017   CL 103 07/19/2017   CREATININE 0.56 07/19/2017   BUN 10 07/19/2017   CO2 30 07/19/2017   TSH 0.48 07/19/2017   HGBA1C 6.6 (A) 10/22/2017   MICROALBUR 6.2 (H) 07/10/2016    Assessment/Plan:  Pain of left lower extremity  -possible arthritis given medial joint line tenderness of L knee. - Plan: DG Knee Complete 4 Views Left -will call pt with results. -consider referral to ortho  Other polyneuropathy  - Plan: gabapentin (NEURONTIN) 100 MG capsule  F/u prn  Grier Mitts, MD

## 2018-01-04 NOTE — Patient Instructions (Signed)
We will get an xray of your L knee today.  We will call you with the results.

## 2018-01-09 ENCOUNTER — Encounter: Payer: Self-pay | Admitting: Family Medicine

## 2018-01-10 ENCOUNTER — Other Ambulatory Visit: Payer: Self-pay | Admitting: Family Medicine

## 2018-01-10 DIAGNOSIS — M25562 Pain in left knee: Secondary | ICD-10-CM

## 2018-01-16 ENCOUNTER — Ambulatory Visit (INDEPENDENT_AMBULATORY_CARE_PROVIDER_SITE_OTHER): Payer: BLUE CROSS/BLUE SHIELD | Admitting: Orthopaedic Surgery

## 2018-01-16 ENCOUNTER — Encounter (INDEPENDENT_AMBULATORY_CARE_PROVIDER_SITE_OTHER): Payer: Self-pay | Admitting: Orthopaedic Surgery

## 2018-01-16 VITALS — Ht 63.5 in | Wt 185.0 lb

## 2018-01-16 DIAGNOSIS — M1712 Unilateral primary osteoarthritis, left knee: Secondary | ICD-10-CM | POA: Diagnosis not present

## 2018-01-16 MED ORDER — BUPIVACAINE HCL 0.25 % IJ SOLN
2.0000 mL | INTRAMUSCULAR | Status: AC | PRN
  Administered 2018-01-16: 2 mL via INTRA_ARTICULAR

## 2018-01-16 MED ORDER — METHYLPREDNISOLONE ACETATE 40 MG/ML IJ SUSP
40.0000 mg | INTRAMUSCULAR | Status: AC | PRN
  Administered 2018-01-16: 40 mg via INTRA_ARTICULAR

## 2018-01-16 MED ORDER — LIDOCAINE HCL 1 % IJ SOLN
2.0000 mL | INTRAMUSCULAR | Status: AC | PRN
  Administered 2018-01-16: 2 mL

## 2018-01-16 NOTE — Progress Notes (Signed)
Office Visit Note   Patient: Maria Hammond           Date of Birth: 19-Jul-1960           MRN: [id number] Visit Date: 01/16/2018              Requested by: Billie Ruddy, MD [place], [place] [address] PCP: Billie Ruddy, MD   Assessment & Plan: Visit Diagnoses:  1. Unilateral primary osteoarthritis, left knee     Plan: Impression is left knee osteoarthritis.  Today, we will inject her left knee joint with cortisone.  She is diabetic and she will keep a close eye on her blood sugar over the next several days.  She will follow-up with [place] as needed.  Follow-Up Instructions: Return if symptoms worsen or fail to improve.   Orders:  Orders Placed This Encounter  Procedures  . Large Joint Inj: L knee   No orders of the defined types were placed in this encounter.     Procedures: Large Joint Inj: L knee on 01/16/2018 3:12 PM Indications: pain Details: 22 G needle, anterolateral approach Medications: 2 mL bupivacaine 0.25 %; 2 mL lidocaine 1 %; 40 mg methylPREDNISolone acetate 40 MG/ML      Clinical Data: No additional findings.   Subjective:   HPI patient is a pleasant 57 year old female who presents to our clinic today with left knee pain.  She has had many years of intermittent pain to the left knee.  She notes 2 previous knee aspirations but is unsure whether she had cortisone injections.  Her pain returned approximately 3 months ago without any known injury or change in activity.  She does work at [employer] where she stands on her feet for 8-hour shifts.  The pain she has is to the medial aspect and occasionally runs down the front of her leg.  She describes this as a constant ache without any locking or catching.  Pain is worse at the end of the day as well as when she goes from a seated to standing position.  She has been taking Tylenol with mild relief of symptoms.  Review of Systems as detailed in HPI.  All others reviewed and are  negative.   Objective: Vital Signs: Ht 5' 3.5" (1.613 m)   Wt 185 lb (83.9 kg)   LMP 12/20/2011   BMI 32.26 kg/m   Physical Exam well-developed well-nourished female no acute distress.  Alert and oriented x3.  Ortho Exam examination of the left knee shows no effusion.  Range of motion 0 to 125 degrees.  Mild patellofemoral crepitus.  Marked medial joint line tenderness.  She is neurovascularly intact distally.  Specialty Comments:  No specialty comments available.  Imaging: X-rays reviewed by me in [place] reveal mild to moderate tricompartmental degenerative changes   PMFS History: Patient Active Problem List   Diagnosis Date Noted  . Unilateral primary osteoarthritis, left knee 01/16/2018  . Obesity 01/26/2012  . Essential hypertension 05/19/2007  . Hypothyroidism 11/13/2006  . Type 2 diabetes mellitus with hyperglycemia ([place]) 11/13/2006  . Hyperlipidemia 11/13/2006   Past Medical History:  Diagnosis Date  . DIABETES MELLITUS, TYPE II 11/13/2006  . HYPERLIPIDEMIA 11/13/2006  . HYPERTENSION 05/19/2007  . HYPOTHYROIDISM 11/13/2006  . TOBACCO USE 12/16/2007   Quit 04/23/10      Family History  Problem Relation Age of Onset  . Diabetes Mother   . Heart disease Father   . Kidney disease Father   .  Prostate cancer Father   . Breast cancer Sister        age 60  . Diabetes Brother   . Diabetes Brother     Past Surgical History:  Procedure Laterality Date  . CESAREAN SECTION    . TONSILLECTOMY  1970  . TRIGGER FINGER RELEASE     around 2012   Social History   Occupational History  . Not on file  Tobacco Use  . Smoking status: Former Smoker    Packs/day: 1.00    Years: 30.00    Pack years: 30.00    Types: Cigarettes    Last attempt to quit: 04/23/2010    Years since quitting: 7.7  . Smokeless tobacco: Never Used  Substance and Sexual Activity  . Alcohol use: No    Alcohol/week: 0.0 standard drinks  . Drug use: No  . Sexual activity: Yes    Partners: Male

## 2018-02-05 ENCOUNTER — Telehealth: Payer: Self-pay | Admitting: Internal Medicine

## 2018-02-05 ENCOUNTER — Other Ambulatory Visit: Payer: Self-pay

## 2018-02-05 MED ORDER — METFORMIN HCL 1000 MG PO TABS: 1000.0000 mg | ORAL_TABLET | Freq: Two times a day (BID) | ORAL | 1 refills | Status: DC

## 2018-02-05 NOTE — Telephone Encounter (Signed)
RX sent

## 2018-02-05 NOTE — Telephone Encounter (Signed)
Patient called requesting a refill for Metformin 500mg . Phone number verified. Please advise

## 2018-02-25 ENCOUNTER — Encounter: Payer: Self-pay | Admitting: Internal Medicine

## 2018-02-25 ENCOUNTER — Ambulatory Visit (INDEPENDENT_AMBULATORY_CARE_PROVIDER_SITE_OTHER): Payer: BLUE CROSS/BLUE SHIELD | Admitting: Internal Medicine

## 2018-02-25 VITALS — BP 112/60 | HR 71 | Ht 63.5 in | Wt 186.0 lb

## 2018-02-25 DIAGNOSIS — E1165 Type 2 diabetes mellitus with hyperglycemia: Secondary | ICD-10-CM | POA: Diagnosis not present

## 2018-02-25 DIAGNOSIS — E785 Hyperlipidemia, unspecified: Secondary | ICD-10-CM

## 2018-02-25 DIAGNOSIS — E039 Hypothyroidism, unspecified: Secondary | ICD-10-CM | POA: Diagnosis not present

## 2018-02-25 LAB — POCT GLYCOSYLATED HEMOGLOBIN (HGB A1C): HEMOGLOBIN A1C: 6.1 % — AB (ref 4.0–5.6)

## 2018-02-25 MED ORDER — METFORMIN HCL ER 500 MG PO TB24: 1000.0000 mg | ORAL_TABLET | Freq: Two times a day (BID) | ORAL | 3 refills | Status: DC

## 2018-02-25 NOTE — Patient Instructions (Addendum)
Please continue: - Metformin 1000 mg 2x a day, but switch to Metformin ER - Glipizide ER 2.5 mg in am, before b'fast  + 2.5 mg  crushed tablet before a larger dinner  Please return in 4-6 months with your sugar log.

## 2018-02-25 NOTE — Addendum Note (Signed)
Addended by: Cardell Peach I on: 02/25/2018 09:35 AM   Modules accepted: Orders

## 2018-02-25 NOTE — Progress Notes (Signed)
Patient ID: Maria Hammond, female   DOB: Apr 07, 1961, 57 y.o.   MRN: [id number]  HPI: Maria Hammond is a 57 y.o.-year-old female, returning for f/u for DM2, dx 2008, non-insulin-dependent, uncontrolled, without long term complications. Last visit 4 months ago.  DM2: Last hemoglobin A1c was: Lab Results  Component Value Date   HGBA1C 6.6 (A) 10/22/2017   HGBA1C 7.7 (H) 07/19/2017   HGBA1C 7.2 03/13/2017  03/13/2017: HbA1c calculated from fructosamine 6.16%  Pt is on a regimen of: - Metformin 1000 mg 2x a day - Glipizide ER 2.5 mg in am, before b'fast  + 2.5 mg  crushed tablet before dinner She could not afford Januvia and Tradjenta. We stopped Amaryl 2 mg in 03/2013 as her sugars were at goal and even had some lows and got lightheaded 4 h after b'fast, while at work (resolved after having a snack).   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  Meter: ReliOn  Pt's meals are: - Breakfast (4 am): 3 strips [place] bacon + 1 egg + coffee - splenda and cream - snack: banana, nuts - Lunch: PB sandwich + bag of chips + yoghurt + fruit + diet Mtn Dew - snack: fruit and nuts - Dinner: sauerkraut + sausage  She works starting at 4 am. She wakes up at 2 am Goes to bed around 8 PM.  -No CKD, last BUN/creatinine:  Lab Results  Component Value Date   BUN 10 07/19/2017   CREATININE 0.56 07/19/2017  On lisinopril. -+ HL; last set of lipids: Lab Results  Component Value Date   CHOL 156 07/19/2017   HDL 61.80  07/19/2017   LDLCALC 83 07/19/2017   LDLDIRECT 66.0 07/13/2014   TRIG 56.0 07/19/2017   CHOLHDL 3 07/19/2017  On Lipitor. - last eye exam was in 10/2017: No DR.  Dr Lady Gary. -Denies numbness and tingling in her feet. She saw a podiatrist.  She has controlled hypothyroidism: Lab Results  Component Value Date   TSH 0.48 07/19/2017   Pt is on levothyroxine 75 mcg daily, taken: - in am - fasting - at least 30 min from b'fast - no Ca, Fe, MVI, PPIs - not on Biotin  Pt denies: - feeling nodules in neck - hoarseness - dysphagia - choking - SOB with lying down  ROS: Constitutional: no weight gain/no weight loss, no fatigue, no subjective hyperthermia, no subjective hypothermia Eyes: no blurry vision, no xerophthalmia ENT: no sore throat, + see HPI Cardiovascular: no CP/no SOB/no palpitations/no leg swelling Respiratory: no cough/no SOB/no wheezing Gastrointestinal: no N/no V/no D/no C/no acid reflux Musculoskeletal: no muscle aches/no joint aches Skin: no rashes, no hair loss Neurological: no tremors/no numbness/no tingling/no dizziness  I reviewed pt's medications, allergies, PMH, social hx, family hx, and changes were documented in the history of present illness. Otherwise, unchanged from my initial visit note.  Past Medical History:  Diagnosis Date  . DIABETES MELLITUS, TYPE II 11/13/2006  . HYPERLIPIDEMIA 11/13/2006  . HYPERTENSION 05/19/2007  . HYPOTHYROIDISM 11/13/2006  . TOBACCO USE 12/16/2007   Quit 04/23/10     Past Surgical History:  Procedure Laterality Date  . CESAREAN SECTION    . TONSILLECTOMY  1970  . TRIGGER FINGER RELEASE     around 2012   Social History   Socioeconomic History  . Marital status: Divorced    Spouse name: Not on file  . Number of children: Not on file  . Years of education: Not on file  . Highest education level: Not on file  Occupational History  . Not on file  Social Needs  . Financial resource strain: Not on file  . Food  insecurity:    Worry: Not on file    Inability: Not on file  . Transportation needs:    Medical: Not on file    Non-medical: Not on file  Tobacco Use  . Smoking status: Former Smoker    Packs/day: 1.00    Years: 30.00    Pack years: 30.00    Types: Cigarettes    Last attempt to quit: 04/23/2010    Years since quitting: 7.8  . Smokeless tobacco: Never Used  Substance and Sexual Activity  . Alcohol use: No    Alcohol/week: 0.0 standard drinks  . Drug use: No  . Sexual activity: Yes    Partners: Male  Lifestyle  . Physical activity:    Days per week: Not on file    Minutes per session: Not on file  . Stress: Not on file  Relationships  . Social connections:    Talks on phone: Not on file    Gets together: Not on file    Attends religious service: Not on file    Active member of club or organization: Not on file    Attends meetings of clubs or organizations: Not on file    Relationship status: Not on file  . Intimate partner violence:    Fear of current or ex partner: Not on file    Emotionally abused: Not on file    Physically abused: Not on file    Forced sexual activity: Not on file  Other Topics Concern  . Not on file  Social History Narrative   New relationship.    In process of divorce (starting 2015- still going 2018). 2 children- boy 24 autism goes to [facility] and works and girl 39 works at the jail in 2016. Son lives with her.  Husband no longer in house. Daughter in [place].       Works at [employer]- 24 years in May 2018.       Hobbies: tv, facebook/internet. Church.       Regular exercise: not at this time   Caffeine use: Diet Mt Dew   Current Outpatient Medications on File Prior to Visit  Medication Sig Dispense Refill  . atorvastatin (LIPITOR) 10 MG tablet TAKE 1 TABLET BY MOUTH ONCE DAILY 90 tablet 3  . cyclobenzaprine (FLEXERIL) 5 MG tablet Take 1 tablet (5 mg total) by mouth at bedtime as needed for muscle spasms. 30 tablet 0  . gabapentin (NEURONTIN) 100 MG  capsule Take 1 capsule (100 mg total) by mouth 3 (three) times daily. 90 capsule 1  . glipiZIDE (GLUCOTROL XL) 2.5 MG 24 hr tablet TAKE 1 WHOLE TABLET BY MOUTH BEFORE BREAKFAST AND 1 CRUSHED TABLET BY MOUTH BEFORE DINNER 180 tablet 3  . levothyroxine (SYNTHROID,  LEVOTHROID) 75 MCG tablet TAKE 1 TABLET BY MOUTH ONCE DAILY 37 tablet 9  . lisinopril (PRINIVIL,ZESTRIL) 5 MG tablet TAKE 1 TABLET BY MOUTH ONCE DAILY 90 tablet 3  . metFORMIN (GLUCOPHAGE) 1000 MG tablet Take 1 tablet (1,000 mg total) by mouth 2 (two) times daily with a meal. 180 tablet 1   No current facility-administered medications on file prior to visit.    No Known Allergies Family History  Problem Relation Age of Onset  . Diabetes Mother   . Heart disease Father   . Kidney disease Father   . Prostate cancer Father   . Breast cancer Sister        age 9  . Diabetes Brother   . Diabetes Brother     PE: Ht 5' 3.5" (1.613 m) Comment: measured  LMP 12/20/2011   BMI 32.26 kg/m  Body mass index is 32.26 kg/m.  Wt Readings from Last 3 Encounters:  01/16/18 185 lb (83.9 kg)  01/04/18 185 lb (83.9 kg)  11/26/17 189 lb (85.7 kg)   Constitutional: overweight, in NAD Eyes: PERRLA, EOMI, no exophthalmos ENT: moist mucous membranes, no thyromegaly, no cervical lymphadenopathy Cardiovascular: RRR, No MRG Respiratory: CTA B Gastrointestinal: abdomen soft, NT, ND, BS+ Musculoskeletal: no deformities, strength intact in all 4 Skin: moist, warm, no rashes Neurological: no tremor with outstretched hands, DTR normal in all 4  ASSESSMENT: 1. DM2, non-insulin-dependent, uncontrolled, without long term complications, but with occasional hyperglycemia -The Freestyle libre CGM was not covered by her insurance  2. Hypothyroidism  3. HL  PLAN:  1. Patient with fairly well-controlled diabetes, on a simple, oral, medication regimen.  At last visit, sugars were stable, with slightly higher values after lunch (up to 180), but usually  only if she but food while driving home afterwards.  If she was eating the food brought from home, sugars were not as high.  We did discuss about improving her diet but we did not change her regimen at last visit. - At this visit, sugars are mostly at goal, with only an occasional hyperglycemic spike due to dietary indiscretions.  She had few lower blood sugars in the second half of the day, but only one lower than 70.  This happened after dinner.  I advised her to only take the glipizide with dinner for a larger meal.  Since she is describing some gas with metformin, I advised her to switch to the extended release metformin.  We will maintain the same dose. - I suggested to:  Patient Instructions  Please continue: - Metformin 1000 mg 2x a day, but switch to Metformin ER - Glipizide ER 2.5 mg in am, before b'fast  + 2.5 mg  crushed tablet before a larger dinner  Please return in 4-6 months with your sugar log.   - today, HbA1c is 6.1% (better!) - continue checking sugars at different times of the day - check 1x a day, rotating checks - advised for yearly eye exams >> she is UTD - refuses flu shout today "I never take it" - Return to clinic in 4-6 mo with sugar log    2. Hypothyroidism - latest thyroid labs reviewed with pt >> normal 06/2017 - she continues on LT4 75 mcg daily - pt feels good on this dose. - we discussed about taking the thyroid hormone every day, with water, >30 minutes before breakfast, separated by >4 hours from acid reflux medications, calcium, iron, multivitamins. Pt. is taking it correctly. - She will have another  TSH checked at her annual physical exam in 06/2018  3. HL - Reviewed latest lipid panel from 06/2017: All fractions at goal Lab Results  Component Value Date   CHOL 156 07/19/2017   HDL 61.80 07/19/2017   LDLCALC 83 07/19/2017   LDLDIRECT 66.0 07/13/2014   TRIG 56.0 07/19/2017   CHOLHDL 3 07/19/2017  - Continues Lipitor without side  effects.  Philemon Kingdom, MD PhD [facility]

## 2018-06-05 ENCOUNTER — Encounter: Payer: Self-pay | Admitting: Family Medicine

## 2018-06-05 ENCOUNTER — Ambulatory Visit (INDEPENDENT_AMBULATORY_CARE_PROVIDER_SITE_OTHER): Payer: BLUE CROSS/BLUE SHIELD | Admitting: Family Medicine

## 2018-06-05 ENCOUNTER — Ambulatory Visit (INDEPENDENT_AMBULATORY_CARE_PROVIDER_SITE_OTHER): Payer: BLUE CROSS/BLUE SHIELD

## 2018-06-05 VITALS — BP 110/60 | HR 68 | Temp 98.5°F | Wt 185.0 lb

## 2018-06-05 DIAGNOSIS — M25561 Pain in right knee: Secondary | ICD-10-CM

## 2018-06-05 DIAGNOSIS — M1712 Unilateral primary osteoarthritis, left knee: Secondary | ICD-10-CM

## 2018-06-05 MED ORDER — DICLOFENAC SODIUM 1 % TD GEL: 2.0000 g | Freq: Four times a day (QID) | TRANSDERMAL | 3 refills | Status: DC

## 2018-06-05 NOTE — Patient Instructions (Signed)
Acute Knee Pain, Adult  Acute knee pain is sudden and may be caused by damage, swelling, or irritation of the muscles and tissues that support your knee. The injury may result from:   A fall.   An injury to your knee from twisting motions.   A hit to the knee.   Infection.  Acute knee pain may go away on its own with time and rest. If it does not, your health care provider may order tests to find the cause of the pain. These may include:   Imaging tests, such as an X-ray, MRI, or ultrasound.   Joint aspiration. In this test, fluid is removed from the knee.   Arthroscopy. In this test, a lighted tube is inserted into the knee and an image is projected onto a TV screen.   Biopsy. In this test, a sample of tissue is removed from the body and studied under a microscope.  Follow these instructions at home:  Pay attention to any changes in your symptoms. Take these actions to relieve your pain.  If you have a knee sleeve or brace:     Wear the sleeve or brace as told by your health care provider. Remove it only as told by your health care provider.   Loosen the sleeve or brace if your toes tingle, become numb, or turn cold and blue.   Keep the sleeve or brace clean.   If the sleeve or brace is not waterproof:  ? Do not let it get wet.  ? Cover it with a watertight covering when you take a bath or shower.  Activity   Rest your knee.   Do not do things that cause pain or make pain worse.   Avoid high-impact activities or exercises, such as running, jumping rope, or doing jumping jacks.   Work with a physical therapist to make a safe exercise program, as recommended by your health care provider. Do exercises as told by your physical therapist.  Managing pain, stiffness, and swelling     If directed, put ice on the knee:  ? Put ice in a plastic bag.  ? Place a towel between your skin and the bag.  ? Leave the ice on for 20 minutes, 2-3 times a day.   If directed, use an elastic bandage to put pressure  (compression) on your injured knee. This may control swelling, give support, and help with discomfort.  General instructions   Take over-the-counter and prescription medicines only as told by your health care provider.   Raise (elevate) your knee above the level of your heart when you are sitting or lying down.   Sleep with a pillow under your knee.   Do not use any products that contain nicotine or tobacco, such as cigarettes, e-cigarettes, and chewing tobacco. These can delay healing. If you need help quitting, ask your health care provider.   If you are overweight, work with your health care provider and a dietitian to set a weight-loss goal that is healthy and reasonable for you. Extra weight can put pressure on your knee.   Keep all follow-up visits as told by your health care provider. This is important.  Contact a health care provider if:   Your knee pain continues, changes, or gets worse.   You have a fever along with knee pain.   Your knee feels warm to the touch.   Your knee buckles or locks up.  Get help right away if:   Your knee swells,   and the swelling becomes worse.   You cannot move your knee.   You have severe pain in your knee.  Summary   Acute knee pain can be caused by a fall, an injury, an infection, or damage, swelling, or irritation of the tissues that support your knee.   Your health care provider may perform tests to find out the cause of the pain.   Pay attention to any changes in your symptoms. Relieve your pain with rest, medicines, light activity, and use of ice.   Get help if your pain continues or becomes worse, your knee swells, or you cannot move your knee.  This information is not intended to replace advice given to you by your health care provider. Make sure you discuss any questions you have with your health care provider.  Document Released: 02/05/2007 Document Revised: 09/20/2017 Document Reviewed: 09/20/2017  Elsevier Interactive Patient Education  2019  Elsevier Inc[employer]

## 2018-06-05 NOTE — Progress Notes (Signed)
Subjective:    Patient ID: Maria Hammond, female    DOB: [birth year], 58 y.o.   MRN: [id number]  No chief complaint on file.   HPI Patient was seen today for ongoing concern of R knee pain x months.  Pt tried Tylenol and ibuprofen for symptoms without relief.  Pt endorses edema, increased pain/soreness when going up steps, and difficulty sleeping at night 2/2 pain.  Pt denies injury.  Seen by Orthofor L knee OA, as noted on xray.  L knee without pain s/p steroid injection.  Past Medical History:  Diagnosis Date  . DIABETES MELLITUS, TYPE II 11/13/2006  . HYPERLIPIDEMIA 11/13/2006  . HYPERTENSION 05/19/2007  . HYPOTHYROIDISM 11/13/2006  . TOBACCO USE 12/16/2007   Quit 04/23/10      No Known Allergies  ROS General: Denies fever, chills, night sweats, changes in weight, changes in appetite HEENT: Denies headaches, ear pain, changes in vision, rhinorrhea, sore throat CV: Denies CP, palpitations, SOB, orthopnea Pulm: Denies SOB, cough, wheezing GI: Denies abdominal pain, nausea, vomiting, diarrhea, constipation GU: Denies dysuria, hematuria, frequency, vaginal discharge Msk: Denies muscle cramps, joint pains +R knee pain Neuro: Denies weakness, numbness, tingling Skin: Denies rashes, bruising Psych: Denies depression, anxiety, hallucinations     Objective:    Blood pressure 110/60, pulse 68, temperature 98.5 F (36.9 C), temperature source Oral, weight 185 lb (83.9 kg), last menstrual period 12/20/2011, SpO2 98 %.  Gen. Pleasant, well-nourished, in no distress, normal affect  HEENT: [place]/AT, face symmetric, no scleral icterus, PERRLA, nares patent without drainage Lungs: no accessory muscle use Cardiovascular: RRR, no peripheral edema Musculoskeletal: TTP of R medial knee above joint line.  Round bony prominence of R medial knee with TTP, crepitus of R knee.  FROM.  No effusion.  L knee with crepitus, no effusion, edema, or erythema.  No deformities, no cyanosis or clubbing, normal  tone Neuro:  A&Ox3, CN II-XII intact, normal gait Skin:  Warm, no lesions/ rash  Wt Readings from Last 3 Encounters:  06/05/18 185 lb (83.9 kg)  02/25/18 186 lb (84.4 kg)  01/16/18 185 lb (83.9 kg)    Lab Results  Component Value Date   WBC 8.3 07/19/2017   HGB 13.1 07/19/2017   HCT 39.4 07/19/2017   PLT 269.0 07/19/2017   GLUCOSE 106 (H) 07/19/2017   CHOL 156 07/19/2017   TRIG 56.0 07/19/2017   HDL 61.80 07/19/2017   LDLDIRECT 66.0 07/13/2014   LDLCALC 83 07/19/2017   ALT 13 07/10/2016   AST 10 07/10/2016   NA 139 07/19/2017   K 3.9 07/19/2017   CL 103 07/19/2017   CREATININE 0.56 07/19/2017   BUN 10 07/19/2017   CO2 30 07/19/2017   TSH 0.48 07/19/2017   HGBA1C 6.1 (A) 02/25/2018   MICROALBUR 6.2 (H) 07/10/2016    Assessment/Plan:  Acute pain of right knee  -suspect OA -will obtain Xray -discussed rest, ice/heat, etc -consider steroid injection or voltaren gel based on xray - Plan: DG Knee Complete 4 Views Right  Primary osteoarthritis of left knee  -improving s/p steroid injection  -continue f/u with ortho -Plan: diclofenac sodium (VOLTAREN) 1 % GEL   F/u prn  Grier Mitts, MD

## 2018-06-25 ENCOUNTER — Other Ambulatory Visit: Payer: Self-pay | Admitting: Family Medicine

## 2018-06-25 NOTE — Telephone Encounter (Signed)
Dr. Volanda Napoleon Pt

## 2018-07-29 ENCOUNTER — Ambulatory Visit: Payer: BLUE CROSS/BLUE SHIELD | Admitting: Internal Medicine

## 2018-08-12 ENCOUNTER — Other Ambulatory Visit (INDEPENDENT_AMBULATORY_CARE_PROVIDER_SITE_OTHER): Payer: BLUE CROSS/BLUE SHIELD

## 2018-08-12 ENCOUNTER — Encounter: Payer: BLUE CROSS/BLUE SHIELD | Admitting: Family Medicine

## 2018-08-12 ENCOUNTER — Other Ambulatory Visit: Payer: Self-pay | Admitting: Family Medicine

## 2018-08-12 ENCOUNTER — Other Ambulatory Visit: Payer: Self-pay

## 2018-08-12 DIAGNOSIS — E039 Hypothyroidism, unspecified: Secondary | ICD-10-CM | POA: Diagnosis not present

## 2018-08-12 DIAGNOSIS — E119 Type 2 diabetes mellitus without complications: Secondary | ICD-10-CM | POA: Diagnosis not present

## 2018-08-12 LAB — TSH: TSH: 0.57 u[IU]/mL (ref 0.35–4.50)

## 2018-08-12 LAB — HEMOGLOBIN A1C: Hgb A1c MFr Bld: 6.9 % — ABNORMAL HIGH (ref 4.6–6.5)

## 2018-09-05 ENCOUNTER — Encounter: Payer: Self-pay | Admitting: Family Medicine

## 2018-09-05 ENCOUNTER — Other Ambulatory Visit: Payer: Self-pay

## 2018-09-05 ENCOUNTER — Ambulatory Visit (INDEPENDENT_AMBULATORY_CARE_PROVIDER_SITE_OTHER): Payer: BLUE CROSS/BLUE SHIELD | Admitting: Family Medicine

## 2018-09-05 DIAGNOSIS — M545 Low back pain, unspecified: Secondary | ICD-10-CM

## 2018-09-05 DIAGNOSIS — S39012A Strain of muscle, fascia and tendon of lower back, initial encounter: Secondary | ICD-10-CM | POA: Diagnosis not present

## 2018-09-05 MED ORDER — TIZANIDINE HCL 4 MG PO TABS: 4.0000 mg | ORAL_TABLET | Freq: Four times a day (QID) | ORAL | 0 refills | Status: DC | PRN

## 2018-09-05 MED ORDER — MELOXICAM 7.5 MG PO TABS: 7.5000 mg | ORAL_TABLET | Freq: Every day | ORAL | 0 refills | Status: DC

## 2018-09-05 NOTE — Progress Notes (Signed)
Virtual Visit via Telephone Note  I connected with Maria Hammond on 09/05/18 at  9:30 AM EDT by telephone and verified that I am speaking with the correct person using two identifiers.   I discussed the limitations, risks, security and privacy concerns of performing an evaluation and management service by telephone and the availability of in person appointments. I also discussed with the patient that there may be a patient responsible charge related to this service. The patient expressed understanding and agreed to proceed.  Location patient: home Location provider: work or home office Participants present for the call: patient, provider Patient did not have a visit in the prior 7 days to address this/these issue(s).   History of Present Illness: Pt is a 57 yo female with pmh sig for HTN, hypothyroidism, DM II, L knee OA, and HLD with uncomfortable feeling in low back since Sunday.  Pt states she may have lifted something heavy overhead at work Saturday.  Discomfort started on L side of back, now in middle of low back.  Sore on L side, pain is dull, constant, 8-9/10, better with laying down slightly elevated.  Worse with standing.  Denies new pain into leg, has prior h/o LLE pain.  Has taken ibuprofen, tylenol, heat.   Called out of work Tuesday, was off Wednesday.  Pt left work early today as back was hurting.    Observations/Objective: Patient sounds cheerful and well on the phone. I do not appreciate any SOB. Speech and thought processing are grossly intact. Patient reported vitals:  Assessment and Plan: Lumbar back pain  -discussed continuing supportive care such as heat, massage, NSAIDs -will start mobic and zanaflex. Given precautions when using muscle relaxer. -discussed activity as tolerated - Plan: tiZANidine (ZANAFLEX) 4 MG tablet, meloxicam (MOBIC) 7.5 MG tablet -f/u prn   Strain of lumbar region, initial encounter -supportive care   Follow Up Instructions: F/u prn  I  did not refer this patient for an OV in the next 24 hours for this/these issue(s).  I discussed the assessment and treatment plan with the patient. The patient was provided an opportunity to ask questions and all were answered. The patient agreed with the plan and demonstrated an understanding of the instructions.   The patient was advised to call back or seek an in-person evaluation if the symptoms worsen or if the condition fails to improve as anticipated.  I provided 11 minutes of non-face-to-face time during this encounter.   Billie Ruddy, MD

## 2018-09-06 ENCOUNTER — Other Ambulatory Visit: Payer: Self-pay | Admitting: Family Medicine

## 2018-09-06 DIAGNOSIS — Z1231 Encounter for screening mammogram for malignant neoplasm of breast: Secondary | ICD-10-CM

## 2018-09-30 ENCOUNTER — Other Ambulatory Visit: Payer: Self-pay | Admitting: Family Medicine

## 2018-10-28 ENCOUNTER — Ambulatory Visit
Admission: RE | Admit: 2018-10-28 | Discharge: 2018-10-28 | Disposition: A | Discharge status: Home / Self Care | Payer: BC Managed Care – PPO | Source: Ambulatory Visit | Attending: Family Medicine | Admitting: Family Medicine

## 2018-10-28 ENCOUNTER — Other Ambulatory Visit: Payer: Self-pay

## 2018-10-28 DIAGNOSIS — Z1231 Encounter for screening mammogram for malignant neoplasm of breast: Secondary | ICD-10-CM

## 2018-11-04 ENCOUNTER — Other Ambulatory Visit: Payer: Self-pay

## 2018-11-06 ENCOUNTER — Ambulatory Visit (INDEPENDENT_AMBULATORY_CARE_PROVIDER_SITE_OTHER): Payer: BC Managed Care – PPO | Admitting: Internal Medicine

## 2018-11-06 ENCOUNTER — Encounter: Payer: Self-pay | Admitting: Internal Medicine

## 2018-11-06 ENCOUNTER — Other Ambulatory Visit: Payer: Self-pay

## 2018-11-06 VITALS — BP 112/58 | HR 72 | Ht 63.5 in | Wt 179.0 lb

## 2018-11-06 DIAGNOSIS — E785 Hyperlipidemia, unspecified: Secondary | ICD-10-CM | POA: Diagnosis not present

## 2018-11-06 DIAGNOSIS — E1165 Type 2 diabetes mellitus with hyperglycemia: Secondary | ICD-10-CM

## 2018-11-06 DIAGNOSIS — E039 Hypothyroidism, unspecified: Secondary | ICD-10-CM | POA: Diagnosis not present

## 2018-11-06 LAB — POCT GLYCOSYLATED HEMOGLOBIN (HGB A1C): Hemoglobin A1C: 6.5 % — AB (ref 4.0–5.6)

## 2018-11-06 LAB — MICROALBUMIN / CREATININE URINE RATIO
Creatinine,U: 76.7 mg/dL
Microalb Creat Ratio: 1.8 mg/g (ref 0.0–30.0)
Microalb, Ur: 1.4 mg/dL (ref 0.0–1.9)

## 2018-11-06 LAB — LIPID PANEL
Cholesterol: 145 mg/dL (ref 0–200)
HDL: 57.5 mg/dL (ref >39.00)
LDL Cholesterol: 70 mg/dL (ref 0–99)
NonHDL: 87.3
Total CHOL/HDL Ratio: 3
Triglycerides: 89 mg/dL (ref 0.0–149.0)
VLDL: 17.8 mg/dL (ref 0.0–40.0)

## 2018-11-06 MED ORDER — METFORMIN HCL ER 500 MG PO TB24: 1000.0000 mg | ORAL_TABLET | Freq: Two times a day (BID) | ORAL | 3 refills | Status: DC

## 2018-11-06 MED ORDER — GLIPIZIDE ER 2.5 MG PO TB24: ORAL_TABLET | ORAL | 3 refills | Status: DC

## 2018-11-06 MED ORDER — LEVOTHYROXINE SODIUM 75 MCG PO TABS: 75.0000 ug | ORAL_TABLET | Freq: Every day | ORAL | 3 refills | Status: DC

## 2018-11-06 NOTE — Patient Instructions (Addendum)
Please continue: - Metformin ER 1000 mg 2x a day - Glipizide ER 2.5 mg in am, before b'fast  + 2.5 mg crushed tablet before large dinner  Please stop at the lab.  Please return in 4 months with your sugar log.

## 2018-11-06 NOTE — Progress Notes (Signed)
Patient ID: Maria Hammond, female   DOB: [birth year], 58 y.o.   MRN: [id number]  HPI: Maria Hammond is a 58 y.o.-year-old female, returning for f/u for DM2, dx 2008, non-insulin-dependent, uncontrolled, without long term complications. Last visit 8 months ago  DM2: Last hemoglobin A1c was: Lab Results  Component Value Date   HGBA1C 6.5 (A) 11/06/2018   HGBA1C 6.9 (H) 08/12/2018   HGBA1C 6.1 (A) 02/25/2018   HGBA1C 6.6 (A) 10/22/2017   HGBA1C 7.7 (H) 07/19/2017   HGBA1C 7.2 03/13/2017   HGBA1C 7.0 12/11/2016   HGBA1C 7.9 (H) 07/10/2016   HGBA1C 8.1 05/29/2016   HGBA1C 6.5 01/27/2016   HGBA1C 6.3 10/25/2015   HGBA1C 8.7 (H) 07/05/2015   HGBA1C 7.7 05/31/2015   HGBA1C 7.4 02/26/2015   HGBA1C 7.1 11/23/2014   HGBA1C 7.2 (H) 08/24/2014   HGBA1C 8.0 (H) 05/25/2014   HGBA1C 7.6 (H) 02/23/2014   HGBA1C 8.6 (H) 11/24/2013   HGBA1C 8.0 (H) 08/13/2013   03/13/2017: HbA1c calculated from fructosamine 6.16%  Pt is on a regimen of: - Metformin ER 1000 mg 2x a day - Glipizide ER 2.5 mg in am, before b'fast  + 2.5 mg crushed tablet before a large dinner She could not afford Januvia and Tradjenta. We stopped Amaryl 2 mg in 03/2013 as her sugars were at goal and even had some lows and got lightheaded 4 h after b'fast, while at work (resolved after having a snack).   Pt checked her sugars 0-1 times a day >> now not checking at all. 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 Highest sugars: 180 >> 168.  Meter: ReliOn  Pt's meals are: - Breakfast (4 am): 3 strips [place] bacon + 1 egg +  coffee - splenda and cream - snack: banana, nuts - Lunch: PB sandwich + bag of chips + yoghurt + fruit + diet Mtn Dew - snack: fruit and nuts - Dinner: sauerkraut + sausage  She works starting at 4 AM and has to wake up at 2 AM.  She goes to bed around 8 PM.  -No CKD, last BUN/creatinine:  Lab Results  Component Value Date   BUN 10 07/19/2017   CREATININE 0.56 07/19/2017  On lisinopril. -+ HL; last set of lipids: Lab Results  Component Value Date   CHOL 156 07/19/2017   HDL 61.80 07/19/2017   LDLCALC 83 07/19/2017   LDLDIRECT 66.0 07/13/2014   TRIG 56.0 07/19/2017   CHOLHDL 3 07/19/2017  On Lipitor. - last eye exam was in 10/2017: No DR.  Dr Lady Gary. -no  numbness and tingling in her feet. She saw a podiatrist.  Hypothyroidism:  Reviewed latest TSH level: Lab Results  Component Value Date   TSH 0.57 08/12/2018   TSH 0.48 07/19/2017   TSH 0.52 07/17/2016   Pt is on levothyroxine 75 mcg daily, taken: - in am - fasting - at least 1h from b'fast - no Ca, Fe, MVI, PPIs - not on Biotin  Pt denies: - feeling nodules in neck - hoarseness - dysphagia - choking - SOB with lying down  ROS: Constitutional: no weight gain/no weight loss, no fatigue, no subjective hyperthermia, no subjective hypothermia Eyes: no blurry vision, no xerophthalmia ENT: no sore throat, + see HPI Cardiovascular: no CP/no SOB/no palpitations/no leg swelling Respiratory: no cough/no SOB/no wheezing Gastrointestinal: no N/no V/no D/no C/no acid reflux Musculoskeletal: no muscle aches/no joint aches Skin: no rashes, no hair loss Neurological: no tremors/no numbness/no tingling/no dizziness  I reviewed pt's medications, allergies, PMH, social hx, family hx, and changes were documented in the history of present illness. Otherwise, unchanged from my initial visit note.  Past Medical History:  Diagnosis Date  . DIABETES MELLITUS, TYPE II 11/13/2006  . HYPERLIPIDEMIA 11/13/2006  . HYPERTENSION  05/19/2007  . HYPOTHYROIDISM 11/13/2006  . TOBACCO USE 12/16/2007   Quit 04/23/10     Past Surgical History:  Procedure Laterality Date  . CESAREAN SECTION    . TONSILLECTOMY  1970  . TRIGGER FINGER RELEASE     around 2012   Social History   Socioeconomic History  . Marital status: Divorced    Spouse name: Not on file  . Number of children: Not on file  . Years of education: Not on file  . Highest education level: Not on file  Occupational History  . Not on file  Social Needs  . Financial resource strain: Not on file  . Food insecurity    Worry: Not on file    Inability: Not on file  . Transportation needs    Medical: Not on file    Non-medical: Not on file  Tobacco Use  . Smoking status: Former Smoker    Packs/day: 1.00    Years: 30.00    Pack years: 30.00    Types: Cigarettes    Quit date: 04/23/2010    Years since quitting: 8.5  . Smokeless tobacco: Never Used  Substance and Sexual Activity  . Alcohol use: No    Alcohol/week: 0.0 standard drinks  . Drug use: No  . Sexual activity: Yes    Partners: Male  Lifestyle  . Physical activity    Days per week: Not on file    Minutes per session: Not on file  . Stress: Not on file  Relationships  . Social Herbalist on phone: Not on file    Gets together: Not on file    Attends religious service: Not on file    Active member of club or organization: Not on file    Attends meetings of clubs or organizations: Not on file    Relationship status: Not on file  . Intimate partner violence    Fear of current or ex partner: Not on file    Emotionally abused: Not on file    Physically abused: Not on file    Forced sexual activity: Not on file  Other Topics Concern  . Not on file  Social History Narrative   New relationship.    In process of divorce (starting 2015- still going 2018). 2 children- boy 38 autism goes to [facility] and works and girl 39 works at the jail in 2016. Son lives with her.  Husband no longer  in house. Daughter in [place].       Works at [employer]- 24 years in May 2018.       Hobbies: tv, facebook/internet. Church.       Regular exercise: not at this time   Caffeine  use: Diet Mt Dew   Current Outpatient Medications on File Prior to Visit  Medication Sig Dispense Refill  . atorvastatin (LIPITOR) 10 MG tablet Take 1 tablet by mouth once daily 90 tablet 0  . diclofenac sodium (VOLTAREN) 1 % GEL Apply 2 g topically 4 (four) times daily. 100 g 3  . gabapentin (NEURONTIN) 100 MG capsule Take 1 capsule (100 mg total) by mouth 3 (three) times daily. 90 capsule 1  . glipiZIDE (GLUCOTROL XL) 2.5 MG 24 hr tablet TAKE 1 WHOLE TABLET BY MOUTH BEFORE BREAKFAST AND 1 CRUSHED TABLET BY MOUTH BEFORE DINNER 180 tablet 3  . levothyroxine (SYNTHROID) 75 MCG tablet Take 1 tablet by mouth once daily 90 tablet 0  . lisinopril (ZESTRIL) 5 MG tablet Take 1 tablet by mouth once daily 90 tablet 0  . meloxicam (MOBIC) 7.5 MG tablet Take 1 tablet (7.5 mg total) by mouth daily. 30 tablet 0  . metFORMIN (GLUCOPHAGE-XR) 500 MG 24 hr tablet Take 2 tablets (1,000 mg total) by mouth 2 (two) times daily with a meal. 360 tablet 3  . tiZANidine (ZANAFLEX) 4 MG tablet Take 1 tablet (4 mg total) by mouth every 6 (six) hours as needed for muscle spasms. 30 tablet 0   No current facility-administered medications on file prior to visit.    No Known Allergies Family History  Problem Relation Age of Onset  . Diabetes Mother   . Heart disease Father   . Kidney disease Father   . Prostate cancer Father   . Breast cancer Sister        age 33  . Diabetes Brother   . Diabetes Brother     PE: BP (!) 112/58   Pulse 72   Ht 5' 3.5" (1.613 m)   Wt 179 lb (81.2 kg)   LMP 12/20/2011   SpO2 97%   BMI 31.21 kg/m  Body mass index is 31.21 kg/m.  Wt Readings from Last 3 Encounters:  11/06/18 179 lb (81.2 kg)  06/05/18 185 lb (83.9 kg)  02/25/18 186 lb (84.4 kg)   Constitutional: overweight, in NAD Eyes: PERRLA, EOMI,  no exophthalmos ENT: moist mucous membranes, no thyromegaly, no cervical lymphadenopathy Cardiovascular: RRR, No MRG Respiratory: CTA B Gastrointestinal: abdomen soft, NT, ND, BS+ Musculoskeletal: no deformities, strength intact in all 4 Skin: moist, warm, no rashes Neurological: no tremor with outstretched hands, DTR normal in all 4  ASSESSMENT: 1. DM2, non-insulin-dependent, uncontrolled, without long term complications, but with occasional hyperglycemia -The Freestyle libre CGM was not covered by her insurance  2. Hypothyroidism  3. HL  PLAN:  1. Patient with fairly well-controlled diabetes, on methimazole, also, medication regimen.  Sugars continue to stay controlled except for occasionally higher values after lunch.  At last visit, she had few lower blood sugars in the second half of the day, and also after dinner.  I advised her to only take glipizide with dinner for a larger meal.  Since she was describing some gas with metformin, I advised her to switch to metformin ER.  She is tolerating this well. -This was higher, at 6.9%, increased from 6.3% - lost 6 lbs in last 8 month and feels better - she is not checking sugars >> strongly advised her to start. - I suggested to:  Patient Instructions  Please continue: - Metformin ER 1000 mg 2x a day - Glipizide ER 2.5 mg in am, before b'fast  + 2.5 mg crushed tablet before large dinner  Please stop at the  lab.  Please return in 4 months with your sugar log.   - we checked her HbA1c: 6.5% (better) - advised to check sugars at different times of the day - 1x a day, rotating check times - advised for yearly eye exams >> she is UTD and has another appointment scheduled in a week - She is due for annual labs >> we will check these today - return to clinic in 4 months   2. Hypothyroidism - latest thyroid labs reviewed with pt >> normal 07/2018 - she continues on LT4 75 mcg daily - pt feels good on this dose. - we discussed about  taking the thyroid hormone every day, with water, >30 minutes before breakfast, separated by >4 hours from acid reflux medications, calcium, iron, multivitamins. Pt. is taking it correctly.  3. HL - Reviewed latest lipid panel from 06/2017: All fractions at goal Lab Results  Component Value Date   CHOL 156 07/19/2017   HDL 61.80 07/19/2017   LDLCALC 83 07/19/2017   LDLDIRECT 66.0 07/13/2014   TRIG 56.0 07/19/2017   CHOLHDL 3 07/19/2017  - Continues Lipitor without side effects.  Component     Latest Ref Rng & Units 11/06/2018  Glucose     65 - 99 mg/dL 76  BUN     7 - 25 mg/dL 12  Creatinine     0.50 - 0.99 mg/dL 0.63  GFR, Est Non African American     > OR = 60 mL/min/1.72m2 97  GFR, Est African American     > OR = 60 mL/min/1.2m2 113  BUN/Creatinine Ratio     6 - 22 (calc) NOT APPLICABLE  Sodium     382 - 146 mmol/L 139  Potassium     3.5 - 5.3 mmol/L 4.2  Chloride     98 - 110 mmol/L 104  CO2     20 - 32 mmol/L 29  Calcium     8.6 - 10.4 mg/dL 9.6  Total Protein     6.1 - 8.1 g/dL 7.2  Albumin MSPROF     3.6 - 5.1 g/dL 3.9  Globulin     1.9 - 3.7 g/dL (calc) 3.3  AG Ratio     1.0 - 2.5 (calc) 1.2  Total Bilirubin     0.2 - 1.2 mg/dL 0.4  Alkaline phosphatase (APISO)     37 - 153 U/L 84  AST     10 - 35 U/L 11  ALT     6 - 29 U/L 15  Cholesterol     0 - 200 mg/dL 145  Triglycerides     0.0 - 149.0 mg/dL 89.0  HDL Cholesterol     >39.00 mg/dL 57.50  VLDL     0.0 - 40.0 mg/dL 17.8  LDL (calc)     0 - 99 mg/dL 70  Total CHOL/HDL Ratio      3  NonHDL      87.30  Microalb, Ur     0.0 - 1.9 mg/dL 1.4  Creatinine,U     mg/dL 76.7  MICROALB/CREAT RATIO     0.0 - 30.0 mg/g 1.8    Philemon Kingdom, MD PhD [facility] Endocrinology

## 2018-11-07 LAB — COMPLETE METABOLIC PANEL WITH GFR
AG Ratio: 1.2 (calc) (ref 1.0–2.5)
ALT: 15 U/L (ref 6–29)
AST: 11 U/L (ref 10–35)
Albumin: 3.9 g/dL (ref 3.6–5.1)
Alkaline phosphatase (APISO): 84 U/L (ref 37–153)
BUN: 12 mg/dL (ref 7–25)
CO2: 29 mmol/L (ref 20–32)
Calcium: 9.6 mg/dL (ref 8.6–10.4)
Chloride: 104 mmol/L (ref 98–110)
Creat: 0.63 mg/dL (ref 0.50–0.99)
GFR, Est African American: 113 mL/min/{1.73_m2} (ref >=60)
GFR, Est Non African American: 97 mL/min/{1.73_m2} (ref >=60)
Globulin: 3.3 g/dL (calc) (ref 1.9–3.7)
Glucose, Bld: 76 mg/dL (ref 65–99)
Potassium: 4.2 mmol/L (ref 3.5–5.3)
Sodium: 139 mmol/L (ref 135–146)
Total Bilirubin: 0.4 mg/dL (ref 0.2–1.2)
Total Protein: 7.2 g/dL (ref 6.1–8.1)

## 2018-11-14 LAB — HM DIABETES EYE EXAM

## 2018-11-21 ENCOUNTER — Other Ambulatory Visit: Payer: Self-pay

## 2018-11-21 ENCOUNTER — Other Ambulatory Visit: Payer: Self-pay | Admitting: Podiatry

## 2018-11-21 ENCOUNTER — Ambulatory Visit (INDEPENDENT_AMBULATORY_CARE_PROVIDER_SITE_OTHER): Payer: BC Managed Care – PPO

## 2018-11-21 ENCOUNTER — Encounter: Payer: Self-pay | Admitting: Podiatry

## 2018-11-21 ENCOUNTER — Ambulatory Visit (INDEPENDENT_AMBULATORY_CARE_PROVIDER_SITE_OTHER): Payer: BC Managed Care – PPO | Admitting: Podiatry

## 2018-11-21 VITALS — Temp 97.6°F

## 2018-11-21 DIAGNOSIS — M205X2 Other deformities of toe(s) (acquired), left foot: Secondary | ICD-10-CM

## 2018-11-21 DIAGNOSIS — M79672 Pain in left foot: Secondary | ICD-10-CM | POA: Diagnosis not present

## 2018-11-21 DIAGNOSIS — M205X1 Other deformities of toe(s) (acquired), right foot: Secondary | ICD-10-CM | POA: Diagnosis not present

## 2018-11-21 NOTE — Progress Notes (Signed)
Subjective:   Patient ID: Maria Hammond, female   DOB: 58 y.o.   MRN: [id number]   HPI Patient presents stating having numbness in the left fifth digit and also has a lot of pain of the big toe joint right which she wants to focus on after we get the other better   ROS      Objective:  Physical Exam  Neurovascular status intact with patient found to have slight irritation around the left fifth digit but I did not know pathology with possibility for trauma with a discolored fifth nail.  Right first MPJ is quite inflamed with diminished range of motion and multiple signs of hallux limitus with arthritic processes     Assessment:  Possibility for contusion of the left fifth digit creating mild numbness symptoms with hallux limitus rigidus deformity right     Plan:  H&P conditions reviewed and I do believe the left will get better and reviewed x-rays with her.  Patient will return in 6 weeks we will get x-ray of the right and she wants to have surgical intervention but I want to see results of x-ray before deciding what would be appropriate  X-ray indicates that there is slight change in the left fifth digit and it is rotated but no indication of fracture

## 2018-12-24 ENCOUNTER — Other Ambulatory Visit: Payer: Self-pay

## 2018-12-24 ENCOUNTER — Ambulatory Visit (INDEPENDENT_AMBULATORY_CARE_PROVIDER_SITE_OTHER): Payer: BC Managed Care – PPO

## 2018-12-24 DIAGNOSIS — Z23 Encounter for immunization: Secondary | ICD-10-CM

## 2018-12-26 ENCOUNTER — Other Ambulatory Visit: Payer: Self-pay | Admitting: Family Medicine

## 2019-01-01 ENCOUNTER — Other Ambulatory Visit: Payer: Self-pay

## 2019-01-01 ENCOUNTER — Ambulatory Visit (INDEPENDENT_AMBULATORY_CARE_PROVIDER_SITE_OTHER): Payer: BC Managed Care – PPO

## 2019-01-01 ENCOUNTER — Ambulatory Visit (INDEPENDENT_AMBULATORY_CARE_PROVIDER_SITE_OTHER): Payer: BC Managed Care – PPO | Admitting: Podiatry

## 2019-01-01 DIAGNOSIS — M205X1 Other deformities of toe(s) (acquired), right foot: Secondary | ICD-10-CM | POA: Diagnosis not present

## 2019-01-01 NOTE — Patient Instructions (Signed)
Pre-Operative Instructions  Congratulations, you have decided to take an important step towards improving your quality of life.  You can be assured that the doctors and staff at [facility] will be with you every step of the way.  Here are some important things you should know:  1. Plan to be at the surgery center/hospital at least 1 (one) hour prior to your scheduled time, unless otherwise directed by the surgical center/hospital staff.  You must have a responsible adult accompany you, remain during the surgery and drive you home.  Make sure you have directions to the surgical center/hospital to ensure you arrive on time. 2. If you are having surgery at [facility] or [facility] hospitals, you will need a copy of your medical history and physical form from your family physician within one month prior to the date of surgery. We will give you a form for your primary physician to complete.  3. We make every effort to accommodate the date you request for surgery.  However, there are times where surgery dates or times have to be moved.  We will contact you as soon as possible if a change in schedule is required.   4. No aspirin/ibuprofen for one week before surgery.  If you are on aspirin, any non-steroidal anti-inflammatory medications (Mobic, Aleve, Ibuprofen) should not be taken seven (7) days prior to your surgery.  You make take Tylenol for pain prior to surgery.  5. Medications - If you are taking daily heart and blood pressure medications, seizure, reflux, allergy, asthma, anxiety, pain or diabetes medications, make sure you notify the surgery center/hospital before the day of surgery so they can tell you which medications you should take or avoid the day of surgery. 6. No food or drink after midnight the night before surgery unless directed otherwise by surgical center/hospital staff. 7. No alcoholic beverages 24-hours prior to surgery.  No smoking 24-hours prior or 24-hours after  surgery. 8. Wear loose pants or shorts. They should be loose enough to fit over bandages, boots, and casts. 9. Don't wear slip-on shoes. Sneakers are preferred. 10. Bring your boot with you to the surgery center/hospital.  Also bring crutches or a walker if your physician has prescribed it for you.  If you do not have this equipment, it will be provided for you after surgery. 11. If you have not been contacted by the surgery center/hospital by the day before your surgery, call to confirm the date and time of your surgery. 12. Leave-time from work may vary depending on the type of surgery you have.  Appropriate arrangements should be made prior to surgery with your employer. 13. Prescriptions will be provided immediately following surgery by your doctor.  Fill these as soon as possible after surgery and take the medication as directed. Pain medications will not be refilled on weekends and must be approved by the doctor. 14. Remove nail polish on the operative foot and avoid getting pedicures prior to surgery. 15. Wash the night before surgery.  The night before surgery wash the foot and leg well with water and the antibacterial soap provided. Be sure to pay special attention to beneath the toenails and in between the toes.  Wash for at least three (3) minutes. Rinse thoroughly with water and dry well with a towel.  Perform this wash unless told not to do so by your physician.  Enclosed: 1 Ice pack (please put in freezer the night before surgery)   1 Hibiclens skin cleaner     Pre-op instructions  If you have any questions regarding the instructions, please do not hesitate to call our office.  [place]: [address], [place], [place] [address] -- [phone]  [place]: [address] [place], [place] [address] -- [phone]  [place]: [address]  [place], [place] [address] -- [phone]  [place]: [address], [address], [place],  [address] -- [phone]  Website:  [email or web address] 

## 2019-01-04 NOTE — Progress Notes (Signed)
Subjective:   Patient ID: Maria Hammond, female   DOB: 58 y.o.   MRN: [id number]   HPI Patient presents stating that her left fifth digit seems to be doing better and she is ready to get this big toe joint fixed on her right foot and states that it bothers her most of the time   ROS      Objective:  Physical Exam  Neurovascular status found to be intact with patient found to have no discomfort currently left fifth digit and has significant range of motion loss first MPJ right with discomfort around the joint surface and no crepitus when I pressed into the joint.  Patient is found to have good digital perfusion well oriented x3 and does have moderate discomfort when I palpated into the joint     Assessment:  Doing well left with hallux limitus rigidus deformity right that is bothersome and becoming increasingly a problem for her     Plan:  NP and spent a great deal time discussing the right foot allowing her to read consent form going over alternative treatments complications associated with hallux limitus correction.  She understands this may ultimately require fusion or joint implantation but my plan would be hopefully to be able to do a osteotomy or bone spur removal.  She understands all of this and understands complications and after extensive review signed consent form understanding total recovery can take 6 months.  Also dispensed air fracture walker for the postoperative.  I wanted to get used to prior to surgery and she is encouraged to call with questions  X-ray indicates spur formation right first MPJ with mild signs of arthritis of the joint surface signed visit

## 2019-01-29 ENCOUNTER — Telehealth: Payer: Self-pay | Admitting: *Deleted

## 2019-01-29 ENCOUNTER — Telehealth: Payer: Self-pay | Admitting: Podiatry

## 2019-01-29 NOTE — Telephone Encounter (Signed)
DOS: 02/25/2019 SURGICAL PROCEDURE: Liane Comber Bunionectomy Poss. Bi-Planar or McBride CPT CODE: 13086 DX CODE: M20.21  Member Information   Member Number: [id number]  Policy Effective : [id number]  -  04/23/9998   Name: Orene Desanctis  Date of Birth: 1960-04-27  Member Liability Summary       In-Network   Max Per Benefit Period Year-to-Date Remaining     CoInsurance         Deductible $1,750.00 $176.48     Out-Of-Pocket 3 $6,850.00 $5,112.48 3 Out-of-Pocket includes copay, deductible, and coinsurance.    Hospital - [facility]      In-Network Copay Coinsurance Authorization Required Not Applicable [id number]  per  Service Year No      Out of Network Copay Coinsurance Authorization Required Deductible Not Applicable Not Applicable No [id number]  per  Service Year Benefit Limits: 1926.48 remaining for Service Year      Out of Network Copay Coinsurance Authorization Required Not Applicable [id number]  per  Service Year No

## 2019-01-29 NOTE — Telephone Encounter (Signed)
Called and spoke to Greece M at [facility] and representative stated that there is no pre-cert or prior authorization required for the procedure code [id number] and the reference number is [id number].Lattie Haw

## 2019-02-13 DIAGNOSIS — M79676 Pain in unspecified toe(s): Secondary | ICD-10-CM

## 2019-02-24 ENCOUNTER — Encounter: Payer: Self-pay | Admitting: Internal Medicine

## 2019-02-24 ENCOUNTER — Ambulatory Visit (INDEPENDENT_AMBULATORY_CARE_PROVIDER_SITE_OTHER): Payer: BC Managed Care – PPO | Admitting: Internal Medicine

## 2019-02-24 VITALS — BP 138/60 | HR 60 | Ht 63.5 in | Wt 183.0 lb

## 2019-02-24 DIAGNOSIS — E1165 Type 2 diabetes mellitus with hyperglycemia: Secondary | ICD-10-CM

## 2019-02-24 DIAGNOSIS — E039 Hypothyroidism, unspecified: Secondary | ICD-10-CM | POA: Diagnosis not present

## 2019-02-24 DIAGNOSIS — E785 Hyperlipidemia, unspecified: Secondary | ICD-10-CM | POA: Diagnosis not present

## 2019-02-24 LAB — POCT GLYCOSYLATED HEMOGLOBIN (HGB A1C): Hemoglobin A1C: 6.2 % — AB (ref 4.0–5.6)

## 2019-02-24 NOTE — Addendum Note (Signed)
Addended by: Cardell Peach I on: 02/24/2019 10:53 AM   Modules accepted: Orders

## 2019-02-24 NOTE — Patient Instructions (Signed)
Please continue: - Metformin ER 1000 mg 2x a day - Glipizide ER 2.5 mg in am, before b'fast  + 2.5 mg crushed tablet before large dinner  Please return in 4 months with your sugar log.

## 2019-02-24 NOTE — Progress Notes (Signed)
Patient ID: Maria Hammond, female   DOB: 10-02-1960, 58 y.o.   MRN: [id number]  HPI: Maria Hammond is a 58 y.o.-year-old female, returning for f/u for DM2, dx 2008, non-insulin-dependent, uncontrolled, without long term complications. Last visit 3.5 months ago.  She will have a foot sx (bone spur) tomorrow.  She will be out of work for 6 weeks.  DM2: Reviewed HbA1c levels: Lab Results  Component Value Date   HGBA1C 6.5 (A) 11/06/2018   HGBA1C 6.9 (H) 08/12/2018   HGBA1C 6.1 (A) 02/25/2018   HGBA1C 6.6 (A) 10/22/2017   HGBA1C 7.7 (H) 07/19/2017   HGBA1C 7.2 03/13/2017   HGBA1C 7.0 12/11/2016   HGBA1C 7.9 (H) 07/10/2016   HGBA1C 8.1 05/29/2016   HGBA1C 6.5 01/27/2016   HGBA1C 6.3 10/25/2015   HGBA1C 8.7 (H) 07/05/2015   HGBA1C 7.7 05/31/2015   HGBA1C 7.4 02/26/2015   HGBA1C 7.1 11/23/2014   HGBA1C 7.2 (H) 08/24/2014   HGBA1C 8.0 (H) 05/25/2014   HGBA1C 7.6 (H) 02/23/2014   HGBA1C 8.6 (H) 11/24/2013   HGBA1C 8.0 (H) 08/13/2013  03/13/2017: HbA1c calculated from fructosamine 6.16%  Pt is on a regimen of: - Metformin ER 1000 mg 2x a day - Glipizide ER 2.5 mg in am, before b'fast  +/- 2.5 mg crushed tablet before large dinner She could not afford Januvia and Tradjenta. We stopped Amaryl 2 mg in 03/2013 as her sugars were at goal and even had some lows and got lightheaded 4 h after b'fast, while at work (resolved after having a snack).   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 Highest sugars: 180 >> 168 >>  136  Meter: ReliOn  Pt's meals are: - Breakfast (4 am): 3 strips [place] bacon + 1 egg + coffee - splenda and cream - snack: banana, nuts - Lunch: PB sandwich + bag of chips + yoghurt + fruit + diet Mtn Dew - snack: fruit and nuts - Dinner: sauerkraut + sausage  She works starting at 4 AM and has to wake up at 2 AM.  She works until approximately 12 PM.  She goes to bed around 8 PM.  -No CKD, last BUN/creatinine:  Lab Results  Component Value Date   BUN 12 11/06/2018   CREATININE 0.63 11/06/2018  On lisinopril. -+ HL; last set of lipids: Lab Results  Component Value Date   CHOL 145 11/06/2018   HDL 57.50 11/06/2018   LDLCALC 70 11/06/2018   LDLDIRECT 66.0 07/13/2014   TRIG 89.0 11/06/2018   CHOLHDL 3 11/06/2018  On Lipitor. - last eye exam was in 10/2018: No DR.  Dr Maria Hammond. -No numbness and tingling in her feet. She saw a podiatrist.  Hypothyroidism:  Reviewed her latest TSH levels: Lab Results  Component Value Date   TSH 0.57 08/12/2018   TSH 0.48 07/19/2017   TSH 0.52 07/17/2016  Pt is on levothyroxine 75 mcg daily, taken: - in am - fasting - at least 30 min from b'fast - no Ca, Fe, MVI, PPIs - not on Biotin  Pt denies: - feeling nodules in neck - hoarseness - dysphagia - choking - SOB with lying down  ROS: Constitutional: + Weight gain/no weight loss, no fatigue, no subjective hyperthermia, no subjective hypothermia Eyes: no blurry vision, no xerophthalmia ENT: no sore throat, + see HPI Cardiovascular: no CP/no SOB/no palpitations/no leg swelling Respiratory: no cough/no SOB/no wheezing Gastrointestinal: no N/no V/no D/no C/no acid reflux Musculoskeletal: no muscle aches/no joint aches Skin: no rashes, no hair loss Neurological: no tremors/no numbness/no tingling/no dizziness  I reviewed pt's medications, allergies, PMH, social hx, family hx, and changes were documented in the history of present illness. Otherwise, unchanged from my initial  visit note.  Past Medical History:  Diagnosis Date  . DIABETES MELLITUS, TYPE II 11/13/2006  . HYPERLIPIDEMIA 11/13/2006  . HYPERTENSION 05/19/2007  . HYPOTHYROIDISM 11/13/2006  . TOBACCO USE 12/16/2007   Quit 04/23/10     Past Surgical History:  Procedure Laterality Date  . CESAREAN SECTION    . TONSILLECTOMY  1970  . TRIGGER FINGER RELEASE     around 2012   Social History   Socioeconomic History  . Marital status: Divorced    Spouse name: Not on file  . Number of children: Not on file  . Years of education: Not on file  . Highest education level: Not on file  Occupational History  . Not on file  Social Needs  . Financial resource strain: Not on file  . Food insecurity    Worry: Not on file    Inability: Not on file  . Transportation needs    Medical: Not on file    Non-medical: Not on file  Tobacco Use  . Smoking status: Former Smoker    Packs/day: 1.00    Years: 30.00    Pack years: 30.00    Types: Cigarettes    Quit date: 04/23/2010    Years since quitting: 8.8  . Smokeless tobacco: Never Used  Substance and Sexual Activity  . Alcohol use: No    Alcohol/week: 0.0 standard drinks  . Drug use: No  . Sexual activity: Yes    Partners: Male  Lifestyle  . Physical activity    Days per week: Not on file    Minutes per session: Not on file  . Stress: Not on file  Relationships  . Social Herbalist on phone: Not on file    Gets together: Not on file    Attends religious service: Not on file    Active member of club or organization: Not on file    Attends meetings of clubs or organizations: Not on file    Relationship status: Not on file  . Intimate partner violence    Fear of current or ex partner: Not on file    Emotionally abused: Not on file    Physically abused: Not on file    Forced sexual activity: Not on file  Other Topics Concern  . Not on file  Social History Narrative   New relationship.    In process of divorce (starting 2015- still  going 2018). 2 children- boy 77 autism goes to [facility] and works and girl 39 works at the jail in 2016. Son lives with her.  Husband no longer in house. Daughter in [place].       Works at  [employer]- 24 years in May 2018.       Hobbies: tv, facebook/internet. Church.       Regular exercise: not at this time   Caffeine use: Diet Mt Dew   Current Outpatient Medications on File Prior to Visit  Medication Sig Dispense Refill  . atorvastatin (LIPITOR) 10 MG tablet Take 1 tablet by mouth once daily 90 tablet 0  . diclofenac sodium (VOLTAREN) 1 % GEL Apply 2 g topically 4 (four) times daily. 100 g 3  . gabapentin (NEURONTIN) 100 MG capsule Take 1 capsule (100 mg total) by mouth 3 (three) times daily. 90 capsule 1  . glipiZIDE (GLUCOTROL XL) 2.5 MG 24 hr tablet TAKE 1 WHOLE TABLET BY MOUTH BEFORE BREAKFAST AND 1 CRUSHED TABLET BY MOUTH BEFORE DINNER 180 tablet 3  . levothyroxine (SYNTHROID) 75 MCG tablet Take 1 tablet (75 mcg total) by mouth daily. 90 tablet 3  . lisinopril (ZESTRIL) 5 MG tablet Take 1 tablet by mouth once daily 90 tablet 0  . meloxicam (MOBIC) 7.5 MG tablet Take 1 tablet (7.5 mg total) by mouth daily. 30 tablet 0  . metFORMIN (GLUCOPHAGE-XR) 500 MG 24 hr tablet Take 2 tablets (1,000 mg total) by mouth 2 (two) times daily with a meal. 360 tablet 3  . tiZANidine (ZANAFLEX) 4 MG tablet Take 1 tablet (4 mg total) by mouth every 6 (six) hours as needed for muscle spasms. 30 tablet 0   No current facility-administered medications on file prior to visit.    No Known Allergies Family History  Problem Relation Age of Onset  . Diabetes Mother   . Heart disease Father   . Kidney disease Father   . Prostate cancer Father   . Breast cancer Sister        age 75  . Diabetes Brother   . Diabetes Brother     PE: BP 138/60   Pulse 60   Ht 5' 3.5" (1.613 m)   Wt 183 lb (83 kg)   LMP 12/20/2011   SpO2 98%   BMI 31.91 kg/m  Body mass index is 31.91 kg/m.  Wt Readings from Last 3  Encounters:  02/24/19 183 lb (83 kg)  11/06/18 179 lb (81.2 kg)  06/05/18 185 lb (83.9 kg)   Constitutional: overweight, in NAD Eyes: PERRLA, EOMI, no exophthalmos ENT: moist mucous membranes, no thyromegaly, no cervical lymphadenopathy Cardiovascular: RRR, No MRG Respiratory: CTA B Gastrointestinal: abdomen soft, NT, ND, BS+ Musculoskeletal: no deformities, strength intact in all 4 Skin: moist, warm, no rashes Neurological: no tremor with outstretched hands, DTR normal in all 4  ASSESSMENT: 1. DM2, non-insulin-dependent, uncontrolled, without long term complications, but with occasional hyperglycemia -The Freestyle libre CGM was not covered by her insurance  2. Hypothyroidism  3. HL  PLAN:  1. Patient with fairly well-controlled diabetes, on Metformin ER (changed because she had gas with metformin IR) and sulfonylurea.  Her sugars remain controlled on target dose Metformin but low dose glipizide ER in a.m. and before dinner.  Her latest HbA1c was better, at 6.5%, decreased from 6.9%.  At last visit she was not checking sugars and I strongly advised her to start. -At this visit, sugars are well controlled on the above regimen.  She is tolerating her medications well.  She only takes glipizide at night with a larger dinner, which is not frequent.  No low blood sugars. -We will not change her regimen at this visit. - I suggested to:  Patient Instructions  Please continue: -  Metformin ER 1000 mg 2x a day - Glipizide ER 2.5 mg in am, before b'fast  + 2.5 mg crushed tablet before large dinner  Please return in 4-6 months with your sugar log.   - we checked her HbA1c: 6.2% (lower) - advised to check sugars at different times of the day - 1x a day, rotating check times - advised for yearly eye exams >> she is UTD - UTD with flu shot - return to clinic in 4-6 months  2. Hypothyroidism - latest thyroid labs reviewed with pt >> normal 07/2018 - she continues on LT4 75 mcg daily - pt  feels good on this dose. - we discussed about taking the thyroid hormone every day, with water, >30 minutes before breakfast, separated by >4 hours from acid reflux medications, calcium, iron, multivitamins. Pt. is taking it correctly.  3. HL -Reviewed latest lipid panel from 10/2018: All fractions at goal Lab Results  Component Value Date   CHOL 145 11/06/2018   HDL 57.50 11/06/2018   LDLCALC 70 11/06/2018   LDLDIRECT 66.0 07/13/2014   TRIG 89.0 11/06/2018   CHOLHDL 3 11/06/2018  -Continues Lipitor without side effects.  Philemon Kingdom, MD PhD [facility] Endocrinology

## 2019-02-25 ENCOUNTER — Encounter: Payer: Self-pay | Admitting: Podiatry

## 2019-02-25 ENCOUNTER — Telehealth: Payer: Self-pay | Admitting: *Deleted

## 2019-02-25 DIAGNOSIS — M2021 Hallux rigidus, right foot: Secondary | ICD-10-CM

## 2019-02-25 NOTE — Telephone Encounter (Signed)
[employer] staffer called with questions concerning directions and quantity of a prescription. This message was answered by Murlean Caller - Surgical Coordinator.

## 2019-02-25 NOTE — Telephone Encounter (Signed)
"  I am calling from [facility].  Maria Hammond was written a prescription for Oxycodone by Dr. Ila Mcgill.  She's here waiting for the prescription.  Her prescription says for her to take one to two tablets every 4-6 hours.  However, her Insurance will only pay for one Oxycodone every 8 hours, not two. They will only allow a five day supply.  Is it okay for me to change her prescription to this?"  Yes, it is okay to change it to this.  "She may have to call back later for a refill which will have to be authorized."  I'll make note of it.

## 2019-02-28 ENCOUNTER — Telehealth: Payer: Self-pay | Admitting: *Deleted

## 2019-02-28 ENCOUNTER — Ambulatory Visit: Payer: BC Managed Care – PPO | Admitting: Internal Medicine

## 2019-02-28 NOTE — Telephone Encounter (Signed)
Left a voicemail message for patient at [phone] (cell #) to check to see how they were doing from when they had surgery done with Dr. Paulla Dolly on Tuesday, February 25, 2019.  DOS 02/25/19 [place] Bunionectomy Bi-Planar Possible or McBride RT  Patient's next appointment is:  Monday, March 03, 2019 3:15 pm with Dr. Paulla Dolly

## 2019-03-03 ENCOUNTER — Encounter: Payer: Self-pay | Admitting: Podiatry

## 2019-03-03 ENCOUNTER — Other Ambulatory Visit: Payer: Self-pay

## 2019-03-03 ENCOUNTER — Ambulatory Visit (INDEPENDENT_AMBULATORY_CARE_PROVIDER_SITE_OTHER): Payer: BC Managed Care – PPO | Admitting: Podiatry

## 2019-03-03 ENCOUNTER — Ambulatory Visit (INDEPENDENT_AMBULATORY_CARE_PROVIDER_SITE_OTHER): Payer: BC Managed Care – PPO

## 2019-03-03 VITALS — BP 120/61 | HR 69 | Temp 97.1°F | Resp 16

## 2019-03-03 DIAGNOSIS — M2011 Hallux valgus (acquired), right foot: Secondary | ICD-10-CM | POA: Diagnosis not present

## 2019-03-10 NOTE — Progress Notes (Signed)
Subjective:   Patient ID: Maria Hammond, female   DOB: 58 y.o.   MRN: [id number]   HPI Patient states overall doing very well so far and pleased with how she is doing   ROS      Objective:  Physical Exam  Neurovascular status intact negative [name]' sign noted wound edges well coapted first metatarsal with good alignment and range of motion which appears to be doing well so far with mild reduction of the motion which would be typical with this type of surgery     Assessment:  Hallux limitus type procedure right which appears to be overall so far doing well with good healing with restricted motion which needs to be worked on     Plan:  H&P x-ray reviewed sterile dressing reapplied continue immobilization elevation compression and begin physical therapy for motion and will be seen back 2 weeks or earlier if needed  X-ray indicates that the osteotomy so far is healing well good position joint appears to be open at the time with fixation in place

## 2019-03-17 ENCOUNTER — Ambulatory Visit (INDEPENDENT_AMBULATORY_CARE_PROVIDER_SITE_OTHER): Payer: BC Managed Care – PPO | Admitting: Podiatry

## 2019-03-17 ENCOUNTER — Ambulatory Visit (INDEPENDENT_AMBULATORY_CARE_PROVIDER_SITE_OTHER): Payer: BC Managed Care – PPO

## 2019-03-17 ENCOUNTER — Other Ambulatory Visit: Payer: Self-pay

## 2019-03-17 ENCOUNTER — Encounter: Payer: Self-pay | Admitting: Podiatry

## 2019-03-17 DIAGNOSIS — M2011 Hallux valgus (acquired), right foot: Secondary | ICD-10-CM

## 2019-03-17 NOTE — Progress Notes (Signed)
Subjective:   Patient ID: Maria Hammond, female   DOB: 58 y.o.   MRN: [id number]   HPI Patient states overall doing really well and I am continuing to work on my range of motion   ROS      Objective:  Physical Exam  Neurovascular status intact negative [name]' sign noted with range of motion improving of the first MPJ right with slight crepitus still noted upon the joint but overall much better than it was preoperatively with no other pathology noted in joint doing well and incision healing well     Assessment:  Overall doing well with good range of motion first MPJ no crepitus currently     Plan:  Reviewed condition and recommended the continuation range of motion exercises anti-inflammatories dispensed ankle compression stocking discussed continued elevation and reappoint 4 weeks or earlier if needed  X-rays indicate osteotomies healing well good alignment no it with no spur formation noted currently

## 2019-03-24 ENCOUNTER — Other Ambulatory Visit: Payer: Self-pay | Admitting: Family Medicine

## 2019-03-29 ENCOUNTER — Other Ambulatory Visit: Payer: Self-pay | Admitting: Family Medicine

## 2019-03-31 ENCOUNTER — Other Ambulatory Visit: Payer: Self-pay | Admitting: Family Medicine

## 2019-03-31 MED ORDER — LISINOPRIL 5 MG PO TABS: 5.0000 mg | ORAL_TABLET | Freq: Every day | ORAL | 0 refills | Status: DC

## 2019-04-01 ENCOUNTER — Other Ambulatory Visit: Payer: Self-pay

## 2019-04-01 DIAGNOSIS — Z20822 Contact with and (suspected) exposure to covid-19: Secondary | ICD-10-CM

## 2019-04-03 LAB — NOVEL CORONAVIRUS, NAA: SARS-CoV-2, NAA: NOT DETECTED

## 2019-04-14 ENCOUNTER — Ambulatory Visit (INDEPENDENT_AMBULATORY_CARE_PROVIDER_SITE_OTHER): Payer: BC Managed Care – PPO

## 2019-04-14 ENCOUNTER — Ambulatory Visit (INDEPENDENT_AMBULATORY_CARE_PROVIDER_SITE_OTHER): Payer: BC Managed Care – PPO | Admitting: Podiatry

## 2019-04-14 ENCOUNTER — Other Ambulatory Visit: Payer: Self-pay

## 2019-04-14 ENCOUNTER — Encounter: Payer: Self-pay | Admitting: Podiatry

## 2019-04-14 VITALS — Temp 96.6°F

## 2019-04-14 DIAGNOSIS — M2011 Hallux valgus (acquired), right foot: Secondary | ICD-10-CM

## 2019-04-16 NOTE — Progress Notes (Signed)
Subjective:   Patient ID: Maria Hammond, female   DOB: 58 y.o.   MRN: [id number]   HPI Patient presents stating doing really well with minimal discomfort and I get occasional shooting pain neuro   ROS      Objective:  Physical Exam  Vascular status intact negative [name]' sign noted with patient noted to have good range of motion of the first MPJ no crepitus of the joint noted     Assessment:  Doing well post osteotomy first metatarsal right foot with good range of motion noted     Plan:  Reviewed condition x-ray and I have recommended continuation of exercises gradual increase in activity and continued compression elevation as needed.  Reappoint to recheck again in the next 4 weeks  X-rays indicate osteotomies healing well fixation in place joint looks congruence with good alignment noted

## 2019-05-26 ENCOUNTER — Other Ambulatory Visit: Payer: Self-pay

## 2019-05-26 ENCOUNTER — Ambulatory Visit (INDEPENDENT_AMBULATORY_CARE_PROVIDER_SITE_OTHER): Payer: BC Managed Care – PPO

## 2019-05-26 ENCOUNTER — Encounter: Payer: Self-pay | Admitting: Podiatry

## 2019-05-26 ENCOUNTER — Ambulatory Visit (INDEPENDENT_AMBULATORY_CARE_PROVIDER_SITE_OTHER): Payer: BC Managed Care – PPO | Admitting: Podiatry

## 2019-05-26 DIAGNOSIS — M2011 Hallux valgus (acquired), right foot: Secondary | ICD-10-CM

## 2019-05-28 NOTE — Progress Notes (Signed)
Subjective:   Patient ID: Maria Hammond, female   DOB: 59 y.o.   MRN: [id number]   HPI Patient states overall doing pretty well but states that she has some minor swelling if she does too much but overall having minimal discomfort   ROS      Objective:  Physical Exam  Neurovascular status intact with patient's right foot healing well wound edges well coapted range of motion slightly diminished but adequate with no pain when I move the joint or crepitus     Assessment:  Overall doing well post osteotomy by plantar first metatarsal right with slight limitation of motion     Plan:  H&P condition reviewed and recommended the continuation of range of motion exercises.  Patient will hopefully be able to increase motion over time and she was encouraged to walk on the big toe joint and will be seen back again in 8 weeks or earlier if needed  X-rays indicate there is some irregularity of the joint surface but this was true preoperatively and the position looks good and the osteotomy has healed well

## 2019-06-20 ENCOUNTER — Other Ambulatory Visit: Payer: Self-pay | Admitting: Family Medicine

## 2019-07-10 ENCOUNTER — Ambulatory Visit: Payer: BC Managed Care – PPO | Attending: Internal Medicine

## 2019-07-10 DIAGNOSIS — Z23 Encounter for immunization: Secondary | ICD-10-CM

## 2019-07-10 NOTE — Progress Notes (Signed)
   Covid-19 Vaccination Clinic  Name:  Maria Hammond    MRN: [id number] DOB: 03/17/1961  07/10/2019  [name] was observed post Covid-19 immunization for 15 minutes without incident. She was provided with Vaccine Information Sheet and instruction to access the V-Safe system.   [name] was instructed to call 911 with any severe reactions post vaccine: Marland Kitchen Difficulty breathing  . Swelling of face and throat  . A fast heartbeat  . A bad rash all over body  . Dizziness and weakness   Immunizations Administered    Name Date Dose VIS Date Route   Pfizer COVID-19 Vaccine 07/10/2019  4:41 PM 0.3 mL 04/04/2019 Intramuscular   Manufacturer: [place]   Lot: [id number]   [place]: [id number]

## 2019-07-23 ENCOUNTER — Other Ambulatory Visit: Payer: Self-pay

## 2019-07-23 ENCOUNTER — Encounter: Payer: Self-pay | Admitting: Podiatry

## 2019-07-23 ENCOUNTER — Ambulatory Visit (INDEPENDENT_AMBULATORY_CARE_PROVIDER_SITE_OTHER): Payer: BC Managed Care – PPO

## 2019-07-23 ENCOUNTER — Ambulatory Visit (INDEPENDENT_AMBULATORY_CARE_PROVIDER_SITE_OTHER): Payer: BC Managed Care – PPO | Admitting: Podiatry

## 2019-07-23 VITALS — Temp 96.9°F

## 2019-07-23 DIAGNOSIS — M2011 Hallux valgus (acquired), right foot: Secondary | ICD-10-CM

## 2019-07-23 NOTE — Progress Notes (Signed)
Subjective:   Patient ID: Maria Hammond, female   DOB: 59 y.o.   MRN: [id number]   HPI Patient states her right foot is improving with just mild achiness but no sharp pain and much better than before procedure   ROS      Objective:  Physical Exam  Neurovascular status intact with patient's right first MPJ improving with good range of motion noted currently and no crepitus of the joint with only mild swelling around the joint and no area that I could palpate that seem to be sore     Assessment:  Appears to be a low-grade inflammatory condition with no indications of acute inflammation currently that is improving as we go through the postoperative.     Plan:  H&P x-ray reviewed and at this point recommended attenuation of conservative care and gradual increase in activity levels.  I did explain that any further inflammation were to occur.  May consider steroid injection around the joint or other weight to reduce inflammation but at this point everything looks stable.  Patient is to be seen back on an as-needed basis and encouraged to call us with any questions concerns which may arise  X-ray indicates that the osteotomy appears to be healing well the lateral pin to be slightly long but it is not currently causing any plantar irritation so we will just keep an eye on it

## 2019-08-06 ENCOUNTER — Ambulatory Visit: Payer: BC Managed Care – PPO

## 2019-08-06 ENCOUNTER — Ambulatory Visit: Payer: BC Managed Care – PPO | Attending: Internal Medicine

## 2019-08-06 DIAGNOSIS — Z23 Encounter for immunization: Secondary | ICD-10-CM

## 2019-08-06 NOTE — Progress Notes (Signed)
   Covid-19 Vaccination Clinic  Name:  Maria Hammond    MRN: [id number] DOB: March 16, 1961  08/06/2019  [name] was observed post Covid-19 immunization for 15 minutes without incident. She was provided with Vaccine Information Sheet and instruction to access the V-Safe system.   [name] was instructed to call 911 with any severe reactions post vaccine: Marland Kitchen Difficulty breathing  . Swelling of face and throat  . A fast heartbeat  . A bad rash all over body  . Dizziness and weakness   Immunizations Administered    Name Date Dose VIS Date Route   Pfizer COVID-19 Vaccine 08/06/2019  2:00 PM 0.3 mL 04/04/2019 Intramuscular   Manufacturer: [place]   Lot: [id number]   [place]: [id number]

## 2019-08-27 ENCOUNTER — Encounter: Payer: Self-pay | Admitting: Internal Medicine

## 2019-08-27 ENCOUNTER — Other Ambulatory Visit: Payer: Self-pay

## 2019-08-27 ENCOUNTER — Other Ambulatory Visit: Payer: Self-pay | Admitting: Family Medicine

## 2019-08-27 ENCOUNTER — Ambulatory Visit (INDEPENDENT_AMBULATORY_CARE_PROVIDER_SITE_OTHER): Payer: BC Managed Care – PPO | Admitting: Internal Medicine

## 2019-08-27 VITALS — BP 122/60 | HR 74 | Ht 63.5 in | Wt 189.0 lb

## 2019-08-27 DIAGNOSIS — E785 Hyperlipidemia, unspecified: Secondary | ICD-10-CM | POA: Diagnosis not present

## 2019-08-27 DIAGNOSIS — E039 Hypothyroidism, unspecified: Secondary | ICD-10-CM

## 2019-08-27 DIAGNOSIS — Z1231 Encounter for screening mammogram for malignant neoplasm of breast: Secondary | ICD-10-CM

## 2019-08-27 DIAGNOSIS — E1165 Type 2 diabetes mellitus with hyperglycemia: Secondary | ICD-10-CM

## 2019-08-27 LAB — POCT GLYCOSYLATED HEMOGLOBIN (HGB A1C): Hemoglobin A1C: 6.5 % — AB (ref 4.0–5.6)

## 2019-08-27 MED ORDER — METFORMIN HCL ER 500 MG PO TB24: 1000.0000 mg | ORAL_TABLET | Freq: Two times a day (BID) | ORAL | 3 refills | Status: DC

## 2019-08-27 MED ORDER — GLIPIZIDE ER 2.5 MG PO TB24: ORAL_TABLET | ORAL | 3 refills | Status: DC

## 2019-08-27 NOTE — Progress Notes (Signed)
Patient ID: Maria Hammond, female   DOB: [birth year], 59 y.o.   MRN: [id number]  This visit occurred during the SARS-CoV-2 public health emergency.  Safety protocols were in place, including screening questions prior to the visit, additional usage of staff PPE, and extensive cleaning of exam room while observing appropriate contact time as indicated for disinfecting solutions.   HPI: Maria Hammond is a 59 y.o.-year-old female, returning for f/u for DM2, dx 2008, non-insulin-dependent, controlled, without long term complications. Last visit 6 months ago.  DM2: Reviewed HbA1c levels: Lab Results  Component Value Date   HGBA1C 6.2 (A) 02/24/2019   HGBA1C 6.5 (A) 11/06/2018   HGBA1C 6.9 (H) 08/12/2018   HGBA1C 6.1 (A) 02/25/2018   HGBA1C 6.6 (A) 10/22/2017   HGBA1C 7.7 (H) 07/19/2017   HGBA1C 7.2 03/13/2017   HGBA1C 7.0 12/11/2016   HGBA1C 7.9 (H) 07/10/2016   HGBA1C 8.1 05/29/2016   HGBA1C 6.5 01/27/2016   HGBA1C 6.3 10/25/2015   HGBA1C 8.7 (H) 07/05/2015   HGBA1C 7.7 05/31/2015   HGBA1C 7.4 02/26/2015   HGBA1C 7.1 11/23/2014   HGBA1C 7.2 (H) 08/24/2014   HGBA1C 8.0 (H) 05/25/2014   HGBA1C 7.6 (H) 02/23/2014   HGBA1C 8.6 (H) 11/24/2013  03/13/2017: HbA1c calculated from fructosamine 6.16%  Pt is on a regimen of: - Metformin ER 1000 mg 2x a day - Glipizide ER 2.5 mg in am, before b'fast  +/- 2.5 mg crushed tablet before large dinner She could not afford Januvia and Tradjenta. We stopped Amaryl 2 mg in 03/2013 as her sugars were at goal and even had some lows and got lightheaded 4 h after b'fast, while at work (resolved after having a snack).   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 Highest sugars: 180 >> 168 >> 136 >> 120  Meter: ReliOn  Pt's meals are: - Breakfast (4 am): 3 strips [place] bacon + 1 egg + coffee - splenda and cream - snack: banana, nuts - Lunch: PB sandwich + bag of chips + yoghurt + fruit + diet Mtn Dew - snack: fruit and nuts - Dinner: sauerkraut + sausage  She works starting at 4 AM and has to wake up at 2 AM.  She works until approximately 12 PM.  She goes to bed around 8 PM.  -No CKD, last BUN/creatinine:  Lab Results  Component Value Date   BUN 12 11/06/2018   CREATININE 0.63 11/06/2018  On lisinopril. -+ HL; last set of lipids: Lab Results  Component Value Date   CHOL 145 11/06/2018   HDL 57.50 11/06/2018   LDLCALC 70 11/06/2018   LDLDIRECT 66.0 07/13/2014   TRIG 89.0 11/06/2018   CHOLHDL 3 11/06/2018  On Lipitor. - last eye exam was in 10/2018: No DR.  Dr Lady Gary. -She denies numbness and tingling in her feet.  She saw podiatry in the past.  Hypothyroidism:  Reviewed her TFTs: Lab Results  Component Value Date   TSH 0.57 08/12/2018   TSH 0.48 07/19/2017   TSH 0.52 07/17/2016   Pt is on levothyroxine 75 mcg daily, taken: - in am - fasting - at least 30 min from b'fast - no Ca, Fe, MVI, PPIs - not on Biotin  Pt denies: - feeling nodules in neck - hoarseness - dysphagia - choking - SOB with lying down  ROS: Constitutional: no weight gain/no weight loss, no fatigue, no subjective hyperthermia, no subjective hypothermia Eyes: no blurry vision, no xerophthalmia ENT: no sore throat, + see HPI Cardiovascular: no CP/no SOB/no palpitations/no leg swelling Respiratory: no cough/no SOB/no wheezing Gastrointestinal: no N/no V/no D/no C/no acid reflux Musculoskeletal: no muscle aches/no joint aches Skin: no rashes, no hair  loss Neurological: no tremors/no numbness/no tingling/no dizziness  I reviewed pt's medications, allergies, PMH, social hx, family hx, and changes were documented in the history of present illness. Otherwise, unchanged from my initial visit note.  Past Medical History:  Diagnosis Date  . DIABETES MELLITUS, TYPE II 11/13/2006  . HYPERLIPIDEMIA 11/13/2006  . HYPERTENSION 05/19/2007  . HYPOTHYROIDISM 11/13/2006  . TOBACCO USE 12/16/2007   Quit 04/23/10     Past Surgical History:  Procedure Laterality Date  . CESAREAN SECTION    . TONSILLECTOMY  1970  . TRIGGER FINGER RELEASE     around 2012   Social History   Socioeconomic History  . Marital status: Divorced    Spouse name: Not on file  . Number of children: Not on file  . Years of education: Not on file  . Highest education level: Not on file  Occupational History  . Not on file  Tobacco Use  . Smoking status: Former Smoker    Packs/day: 1.00    Years: 30.00    Pack years: 30.00    Types: Cigarettes    Quit date: 04/23/2010    Years since quitting: 9.3  . Smokeless tobacco: Never Used  Substance and Sexual Activity  . Alcohol use: No    Alcohol/week: 0.0 standard drinks  . Drug use: No  . Sexual activity: Yes    Partners: Male  Other Topics Concern  . Not on file  Social History Narrative   New relationship.    In process of divorce (starting 2015- still going 2018). 2 children- boy 62 autism goes to [facility] and works and girl 39 works at the jail in 2016. Son lives with her.  Husband no longer in house. Daughter in [place].       Works at [employer]- 24 years in May 2018.       Hobbies: tv, facebook/internet. Church.       Regular exercise: not at this time   Caffeine use: Diet Mt Dew   Social Determinants of Health   Financial Resource Strain:   . Difficulty of Paying Living Expenses:   Food Insecurity:   . Worried About Charity fundraiser in the Last Year:   . Arboriculturist in the Last Year:   Transportation  Needs:   . Film/video editor (Medical):   Marland Kitchen Lack of Transportation (Non-Medical):   Physical Activity:   . Days of Exercise per Week:   . Minutes of Exercise per Session:   Stress:   . Feeling of Stress :   Social Connections:   . Frequency of Communication with Friends and Family:   . Frequency  of Social Gatherings with Friends and Family:   . Attends Religious Services:   . Active Member of Clubs or Organizations:   . Attends Archivist Meetings:   Marland Kitchen Marital Status:   Intimate Partner Violence:   . Fear of Current or Ex-Partner:   . Emotionally Abused:   Marland Kitchen Physically Abused:   . Sexually Abused:    Current Outpatient Medications on File Prior to Visit  Medication Sig Dispense Refill  . atorvastatin (LIPITOR) 10 MG tablet Take 1 tablet by mouth once daily 90 tablet 0  . diclofenac sodium (VOLTAREN) 1 % GEL Apply 2 g topically 4 (four) times daily. 100 g 3  . gabapentin (NEURONTIN) 100 MG capsule Take 1 capsule (100 mg total) by mouth 3 (three) times daily. 90 capsule 1  . glipiZIDE (GLUCOTROL XL) 2.5 MG 24 hr tablet TAKE 1 WHOLE TABLET BY MOUTH BEFORE BREAKFAST AND 1 CRUSHED TABLET BY MOUTH BEFORE DINNER 180 tablet 3  . levothyroxine (SYNTHROID) 75 MCG tablet Take 1 tablet (75 mcg total) by mouth daily. 90 tablet 3  . lisinopril (ZESTRIL) 5 MG tablet Take 1 tablet by mouth once daily 90 tablet 0  . meloxicam (MOBIC) 7.5 MG tablet Take 1 tablet (7.5 mg total) by mouth daily. 30 tablet 0  . metFORMIN (GLUCOPHAGE-XR) 500 MG 24 hr tablet Take 2 tablets (1,000 mg total) by mouth 2 (two) times daily with a meal. 360 tablet 3  . tiZANidine (ZANAFLEX) 4 MG tablet Take 1 tablet (4 mg total) by mouth every 6 (six) hours as needed for muscle spasms. 30 tablet 0   No current facility-administered medications on file prior to visit.   No Known Allergies Family History  Problem Relation Age of Onset  . Diabetes Mother   . Heart disease Father   . Kidney disease Father   .  Prostate cancer Father   . Breast cancer Sister        age 57  . Diabetes Brother   . Diabetes Brother     PE: BP 122/60   Pulse 74   Ht 5' 3.5" (1.613 m)   Wt 189 lb (85.7 kg)   LMP 12/20/2011   SpO2 95%   BMI 32.95 kg/m  Body mass index is 32.95 kg/m.  Wt Readings from Last 3 Encounters:  08/27/19 189 lb (85.7 kg)  02/24/19 183 lb (83 kg)  11/06/18 179 lb (81.2 kg)   Constitutional: overweight, in NAD Eyes: PERRLA, EOMI, no exophthalmos ENT: moist mucous membranes, no thyromegaly, no cervical lymphadenopathy Cardiovascular: RRR, No MRG Respiratory: CTA B Gastrointestinal: abdomen soft, NT, ND, BS+ Musculoskeletal: no deformities, strength intact in all 4 Skin: moist, warm, no rashes Neurological: no tremor with outstretched hands, DTR normal in all 4  ASSESSMENT: 1. DM2, non-insulin-dependent, controlled, without long term complications, but with occasional hyperglycemia -The Freestyle libre CGM was not covered by her insurance  2. Hypothyroidism  3. HL  PLAN:  1. Patient with fairly well-controlled diabetes, on metformin ER (due to bloating with Metformin IR) and sulfonylurea.  Sugars remain controlled on target dose Metformin but low-dose glipizide ER in the morning and before a larger.  Latest HbA1c was excellent, at 6.2% at last visit.  We did not change the regimen then. -At this visit, we reviewed her sugar logs.  Unfortunately, she only has 5 readings in the last 3 months.  We discussed about the importance of checking sugars every day or every other day, rotating check times.  The  sugars in her log are at goal, though. -We will not change her regimen at this visit. -She does complain of numbness in her fingers and we discussed that Metformin can decrease the absorption of vitamin B12.  Our lab is closed today and she would want to hold off checking a vitamin B12 level until she sees her PCP. - I suggested to:  Patient Instructions  Please continue: -  Metformin ER 1000 mg 2x a day - Glipizide ER 2.5 mg in am, before b'fast  + 2.5 mg crushed tablet before large dinner  Please ask Dr. Volanda Napoleon to check a  Vitamin B12 level.  Check sugars daily or at least every other day.  Please return in 6 months with your sugar log.   - we checked her HbA1c: 6.5% (increased) - advised to check sugars at different times of the day - 1x a day, rotating check times - advised for yearly eye exams >> she is UTD - return to clinic in 6 months  2. Hypothyroidism - latest thyroid labs reviewed with pt >> normal: Lab Results  Component Value Date   TSH 0.57 08/12/2018   - she continues on LT4 75 mcg daily - pt feels good on this dose. - we discussed about taking the thyroid hormone every day, with water, >30 minutes before breakfast, separated by >4 hours from acid reflux medications, calcium, iron, multivitamins. Pt. is taking it correctly. - she will have a TSH checked by PCP at next OV, per her preference (our lab is closed today)  3. HL -Reviewed latest lipid panel from 10/2018: All fractions at goal Lab Results  Component Value Date   CHOL 145 11/06/2018   HDL 57.50 11/06/2018   LDLCALC 70 11/06/2018   LDLDIRECT 66.0 07/13/2014   TRIG 89.0 11/06/2018   CHOLHDL 3 11/06/2018  -Continues Lipitor without side effects  Philemon Kingdom, MD PhD [facility] Endocrinology

## 2019-08-27 NOTE — Patient Instructions (Addendum)
Please continue: - Metformin ER 1000 mg 2x a day - Glipizide ER 2.5 mg in am, before b'fast  + 2.5 mg crushed tablet before large dinner  Please ask Dr. Volanda Napoleon to check a  Vitamin B12 level.  Check sugars daily or at least every other day.  Please return in 6 months with your sugar log.

## 2019-08-27 NOTE — Addendum Note (Signed)
Addended by: Cardell Peach I on: 08/27/2019 09:29 AM   Modules accepted: Orders

## 2019-09-24 ENCOUNTER — Other Ambulatory Visit: Payer: Self-pay | Admitting: Family Medicine

## 2019-09-26 ENCOUNTER — Other Ambulatory Visit: Payer: Self-pay | Admitting: Family Medicine

## 2019-09-26 NOTE — Telephone Encounter (Signed)
Pt needs appointment for further refills 

## 2019-10-16 ENCOUNTER — Ambulatory Visit (INDEPENDENT_AMBULATORY_CARE_PROVIDER_SITE_OTHER): Payer: BC Managed Care – PPO | Admitting: Family Medicine

## 2019-10-16 ENCOUNTER — Encounter: Payer: Self-pay | Admitting: Family Medicine

## 2019-10-16 ENCOUNTER — Other Ambulatory Visit: Payer: Self-pay

## 2019-10-16 VITALS — BP 110/60 | HR 72 | Temp 97.8°F | Wt 188.0 lb

## 2019-10-16 DIAGNOSIS — R202 Paresthesia of skin: Secondary | ICD-10-CM

## 2019-10-16 DIAGNOSIS — Z23 Encounter for immunization: Secondary | ICD-10-CM | POA: Diagnosis not present

## 2019-10-16 DIAGNOSIS — S29012A Strain of muscle and tendon of back wall of thorax, initial encounter: Secondary | ICD-10-CM | POA: Diagnosis not present

## 2019-10-16 DIAGNOSIS — R5383 Other fatigue: Secondary | ICD-10-CM | POA: Diagnosis not present

## 2019-10-16 DIAGNOSIS — Z0000 Encounter for general adult medical examination without abnormal findings: Secondary | ICD-10-CM | POA: Diagnosis not present

## 2019-10-16 DIAGNOSIS — E039 Hypothyroidism, unspecified: Secondary | ICD-10-CM

## 2019-10-16 DIAGNOSIS — E782 Mixed hyperlipidemia: Secondary | ICD-10-CM

## 2019-10-16 DIAGNOSIS — M1711 Unilateral primary osteoarthritis, right knee: Secondary | ICD-10-CM

## 2019-10-16 DIAGNOSIS — M779 Enthesopathy, unspecified: Secondary | ICD-10-CM

## 2019-10-16 DIAGNOSIS — E559 Vitamin D deficiency, unspecified: Secondary | ICD-10-CM

## 2019-10-16 LAB — CBC WITH DIFFERENTIAL/PLATELET
Basophils Absolute: 0 10*3/uL (ref 0.0–0.1)
Basophils Relative: 0.5 % (ref 0.0–3.0)
Eosinophils Absolute: 0.1 10*3/uL (ref 0.0–0.7)
Eosinophils Relative: 1.5 % (ref 0.0–5.0)
HCT: 40.1 % (ref 36.0–46.0)
Hemoglobin: 13.3 g/dL (ref 12.0–15.0)
Lymphocytes Relative: 36 % (ref 12.0–46.0)
Lymphs Abs: 3.5 10*3/uL (ref 0.7–4.0)
MCHC: 33.2 g/dL (ref 30.0–36.0)
MCV: 86 fl (ref 78.0–100.0)
Monocytes Absolute: 0.8 10*3/uL (ref 0.1–1.0)
Monocytes Relative: 8.6 % (ref 3.0–12.0)
Neutro Abs: 5.2 10*3/uL (ref 1.4–7.7)
Neutrophils Relative %: 53.4 % (ref 43.0–77.0)
Platelets: 271 10*3/uL (ref 150.0–400.0)
RBC: 4.66 Mil/uL (ref 3.87–5.11)
RDW: 13.9 % (ref 11.5–15.5)
WBC: 9.7 10*3/uL (ref 4.0–10.5)

## 2019-10-16 LAB — LIPID PANEL
Cholesterol: 174 mg/dL (ref 0–200)
HDL: 62.7 mg/dL (ref >39.00)
LDL Cholesterol: 99 mg/dL (ref 0–99)
NonHDL: 110.89
Total CHOL/HDL Ratio: 3
Triglycerides: 59 mg/dL (ref 0.0–149.0)
VLDL: 11.8 mg/dL (ref 0.0–40.0)

## 2019-10-16 LAB — FOLATE: Folate: 14.3 ng/mL (ref >5.9)

## 2019-10-16 LAB — TSH: TSH: 0.31 u[IU]/mL — ABNORMAL LOW (ref 0.35–4.50)

## 2019-10-16 LAB — VITAMIN B12: Vitamin B-12: 193 pg/mL — ABNORMAL LOW (ref 211–911)

## 2019-10-16 LAB — VITAMIN D 25 HYDROXY (VIT D DEFICIENCY, FRACTURES): VITD: 14.47 ng/mL — ABNORMAL LOW (ref 30.00–100.00)

## 2019-10-16 NOTE — Progress Notes (Signed)
Subjective:     Maria Hammond is a 59 y.o. female and is here for a comprehensive physical exam. The patient reports problems - Tingling in fingers, and pain, knee pain.  Pt notes tingling sensation in fingers.  Also with thoracic back pain x 1.5 wks.  Tried Tylenol, ibuprofen.  Patient endorses doing some lifting, pushing, and pulling at work.  Pt with continued R knee pain, dx'd with OA in the past.  Tried Voltaren gel.  Patient taking Synthroid 75 mcg daily for hypothyroidism.  Pt also taking glipizide XL 2.5 mg and Metformin XR 500 mg for DM.  Followed by Dr. Cruzita Lederer.  Denies hyper or hypoglycemia.  Mammogram scheduled in July.  Colonoscopy done 11/12/18.  Social History   Socioeconomic History  . Marital status: Divorced    Spouse name: Not on file  . Number of children: Not on file  . Years of education: Not on file  . Highest education level: Not on file  Occupational History  . Not on file  Tobacco Use  . Smoking status: Former Smoker    Packs/day: 1.00    Years: 30.00    Pack years: 30.00    Types: Cigarettes    Quit date: 04/23/2010    Years since quitting: 9.4  . Smokeless tobacco: Never Used  Substance and Sexual Activity  . Alcohol use: No    Alcohol/week: 0.0 standard drinks  . Drug use: No  . Sexual activity: Yes    Partners: Male  Other Topics Concern  . Not on file  Social History Narrative   New relationship.    In process of divorce (starting 2015- still going 2018). 2 children- boy 33 autism goes to [facility] and works and girl 39 works at the jail in 2016. Son lives with her.  Husband no longer in house. Daughter in [place].       Works at [employer]- 24 years in May 2018.       Hobbies: tv, facebook/internet. Church.       Regular exercise: not at this time   Caffeine use: Diet Mt Dew   Social Determinants of Health   Financial Resource Strain:   . Difficulty of Paying Living Expenses:   Food Insecurity:   . Worried About Charity fundraiser in the Last  Year:   . Arboriculturist in the Last Year:   Transportation Needs:   . Film/video editor (Medical):   Marland Kitchen Lack of Transportation (Non-Medical):   Physical Activity:   . Days of Exercise per Week:   . Minutes of Exercise per Session:   Stress:   . Feeling of Stress :   Social Connections:   . Frequency of Communication with Friends and Family:   . Frequency of Social Gatherings with Friends and Family:   . Attends Religious Services:   . Active Member of Clubs or Organizations:   . Attends Archivist Meetings:   Marland Kitchen Marital Status:   Intimate Partner Violence:   . Fear of Current or Ex-Partner:   . Emotionally Abused:   Marland Kitchen Physically Abused:   . Sexually Abused:    Health Maintenance  Topic Date Due  . Hepatitis C Screening  Never done  . FOOT EXAM  07/17/2017  . PAP SMEAR-Modifier  07/12/2018  . TETANUS/TDAP  10/16/2018  . COLONOSCOPY  11/12/2018  . OPHTHALMOLOGY EXAM  11/14/2019  . INFLUENZA VACCINE  11/23/2019  . HEMOGLOBIN A1C  02/27/2020  . MAMMOGRAM  10/27/2020  .  PNEUMOCOCCAL POLYSACCHARIDE VACCINE AGE 65-64 HIGH RISK  Completed  . COVID-19 Vaccine  Completed  . HIV Screening  Completed    The following portions of the patient's history were reviewed and updated as appropriate: allergies, current medications, past family history, past medical history, past social history, past surgical history and problem list.  Review of Systems Pertinent items noted in HPI and remainder of comprehensive ROS otherwise negative.   Objective:    BP 110/60 (BP Location: Left Arm, Patient Position: Sitting, Cuff Size: Large)   Pulse 72   Temp 97.8 F (36.6 C) (Temporal)   Wt 188 lb (85.3 kg)   LMP 12/20/2011   SpO2 97%   BMI 32.78 kg/m  General appearance: alert, cooperative and no distress Head: Normocephalic, without obvious abnormality, atraumatic Eyes: conjunctivae/corneas clear. PERRL, EOM's intact. Fundi benign. Ears: normal TM's and external ear canals  both ears Nose: Nares normal. Septum midline. Mucosa normal. No drainage or sinus tenderness. Throat: lips, mucosa, and tongue normal; teeth and gums normal Neck: no adenopathy, no carotid bruit, no JVD, supple, symmetrical, trachea midline and thyroid not enlarged, symmetric, no tenderness/mass/nodules Lungs: clear to auscultation bilaterally Heart: regular rate and rhythm, S1, S2 normal, no murmur, click, rub or gallop Abdomen: soft, non-tender; bowel sounds normal; no masses,  no organomegaly Extremities: extremities normal, atraumatic, no cyanosis or edema Pulses: 2+ and symmetric  R knee without erythema, crepitus, or effusion.  Prominent bony nodule of right knee medial to tibial tuberosity, 2.5 cm x 1.5 cm Skin: Skin color, texture, turgor normal. No rashes or lesions Lymph nodes: Cervical, supraclavicular, and axillary nodes normal. Neurologic: Alert and oriented X 3, normal strength and tone. Normal symmetric reflexes. Normal coordination and gait    Assessment:    Healthy female exam with numbness in fingers, R thoracic paraspinal muscle pain and R knee pain.     Plan:     Anticipatory guidance given including wearing seatbelts, smoke detectors in the home, increasing physical activity, increasing p.o. intake of water and vegetables. -will obtain labs -mammogram scheduled in July. -pap done 2017, 2022 -Colonoscopy up-to-date done 11/12/2018.  Due 2030. -given handout -Next CPE in 1 yr See After Visit Summary for Counseling Recommendations    Paresthesia of both hands  - Plan: CBC with Differential/Platelet, TSH, Vitamin B12, Folate  Acquired hypothyroidism  -Continue Synthroid 75 mcg daily.  Will adjust dose if needed based on labs - Plan: TSH  Strain of thoracic paraspinal muscles excluding T1 and T2 levels, initial encounter -Discussed supportive care -Consider PT for continued symptoms -Given handout  Mixed hyperlipidemia -Continue lifestyle  modifications -Continue Lipitor 10 mg daily - Plan: Lipid panel  Fatigue, unspecified type -We will obtain labs -Consider sleep study for continued symptoms - Plan: CBC with Differential/Platelet, Vitamin B12, Vitamin D, 25-hydroxy  Need for Tdap vaccination -Tdap given this visit  Bone spur  -Nodule spur formation at medial compartment has noted on x-ray right knee 06/05/2018.  Patellofemoral spurs noted as well - Plan: DG Knee Complete 4 Views Right  arthritis right knee -Mild degenerative changes  seen on imaging on 06/05/2018 -Discussed supportive care including Voltaren gel, heat, Tylenol arthritis strength -For continued or worsening symptoms consider Ortho referral. - Plan: DG Knee Complete 4 Views Right  F/u as needed in the next few months  Grier Mitts, MD

## 2019-10-16 NOTE — Patient Instructions (Addendum)
Preventive Care 40-61 Years Old, Female Preventive care refers to visits with your health care provider and lifestyle choices that can promote health and wellness. This includes:  A yearly physical exam. This may also be called an annual well check.  Regular dental visits and eye exams.  Immunizations.  Screening for certain conditions.  Healthy lifestyle choices, such as eating a healthy diet, getting regular exercise, not using drugs or products that contain nicotine and tobacco, and limiting alcohol use. What can I expect for my preventive care visit? Physical exam Your health care provider will check your:  Height and weight. This may be used to calculate body mass index (BMI), which tells if you are at a healthy weight.  Heart rate and blood pressure.  Skin for abnormal spots. Counseling Your health care provider may ask you questions about your:  Alcohol, tobacco, and drug use.  Emotional well-being.  Home and relationship well-being.  Sexual activity.  Eating habits.  Work and work environment.  Method of birth control.  Menstrual cycle.  Pregnancy history. What immunizations do I need?  Influenza (flu) vaccine  This is recommended every year. Tetanus, diphtheria, and pertussis (Tdap) vaccine  You may need a Td booster every 10 years. Varicella (chickenpox) vaccine  You may need this if you have not been vaccinated. Zoster (shingles) vaccine  You may need this after age 60. Measles, mumps, and rubella (MMR) vaccine  You may need at least one dose of MMR if you were born in 1957 or later. You may also need a second dose. Pneumococcal conjugate (PCV13) vaccine  You may need this if you have certain conditions and were not previously vaccinated. Pneumococcal polysaccharide (PPSV23) vaccine  You may need one or two doses if you smoke cigarettes or if you have certain conditions. Meningococcal conjugate (MenACWY) vaccine  You may need this if you  have certain conditions. Hepatitis A vaccine  You may need this if you have certain conditions or if you travel or work in places where you may be exposed to hepatitis A. Hepatitis B vaccine  You may need this if you have certain conditions or if you travel or work in places where you may be exposed to hepatitis B. Haemophilus influenzae type b (Hib) vaccine  You may need this if you have certain conditions. Human papillomavirus (HPV) vaccine  If recommended by your health care provider, you may need three doses over 6 months. You may receive vaccines as individual doses or as more than one vaccine together in one shot (combination vaccines). Talk with your health care provider about the risks and benefits of combination vaccines. What tests do I need? Blood tests  Lipid and cholesterol levels. These may be checked every 5 years, or more frequently if you are over 50 years old.  Hepatitis C test.  Hepatitis B test. Screening  Lung cancer screening. You may have this screening every year starting at age 55 if you have a 30-pack-year history of smoking and currently smoke or have quit within the past 15 years.  Colorectal cancer screening. All adults should have this screening starting at age 50 and continuing until age 75. Your health care provider may recommend screening at age 45 if you are at increased risk. You will have tests every 1-10 years, depending on your results and the type of screening test.  Diabetes screening. This is done by checking your blood sugar (glucose) after you have not eaten for a while (fasting). You may have this   done every 1-3 years.  Mammogram. This may be done every 1-2 years. Talk with your health care provider about when you should start having regular mammograms. This may depend on whether you have a family history of breast cancer.  BRCA-related cancer screening. This may be done if you have a family history of breast, ovarian, tubal, or peritoneal  cancers.  Pelvic exam and Pap test. This may be done every 3 years starting at age 76. Starting at age 37, this may be done every 5 years if you have a Pap test in combination with an HPV test. Other tests  Sexually transmitted disease (STD) testing.  Bone density scan. This is done to screen for osteoporosis. You may have this scan if you are at high risk for osteoporosis. Follow these instructions at home: Eating and drinking  Eat a diet that includes fresh fruits and vegetables, whole grains, lean protein, and low-fat dairy.  Take vitamin and mineral supplements as recommended by your health care provider.  Do not drink alcohol if: ? Your health care provider tells you not to drink. ? You are pregnant, may be pregnant, or are planning to become pregnant.  If you drink alcohol: ? Limit how much you have to 0-1 drink a day. ? Be aware of how much alcohol is in your drink. In the [place] one drink equals one 12 oz bottle of beer (355 mL), one 5 oz glass of wine (148 mL), or one 1 oz glass of hard liquor (44 mL). Lifestyle  Take daily care of your teeth and gums.  Stay active. Exercise for at least 30 minutes on 5 or more days each week.  Do not use any products that contain nicotine or tobacco, such as cigarettes, e-cigarettes, and chewing tobacco. If you need help quitting, ask your health care provider.  If you are sexually active, practice safe sex. Use a condom or other form of birth control (contraception) in order to prevent pregnancy and STIs (sexually transmitted infections).  If told by your health care provider, take low-dose aspirin daily starting at age 60. What's next?  Visit your health care provider once a year for a well check visit.  Ask your health care provider how often you should have your eyes and teeth checked.  Stay up to date on all vaccines. This information is not intended to replace advice given to you by your health care provider. Make sure you  discuss any questions you have with your health care provider. Document Revised: 12/20/2017 Document Reviewed: 12/20/2017 Elsevier Patient Education  [employer].  Fatigue If you have fatigue, you feel tired all the time and have a lack of energy or a lack of motivation. Fatigue may make it difficult to start or complete tasks because of exhaustion. In general, occasional or mild fatigue is often a normal response to activity or life. However, long-lasting (chronic) or extreme fatigue may be a symptom of a medical condition. Follow these instructions at home: General instructions  Watch your fatigue for any changes.  Go to bed and get up at the same time every day.  Avoid fatigue by pacing yourself during the day and getting enough sleep at night.  Maintain a healthy weight. Medicines  Take over-the-counter and prescription medicines only as told by your health care provider.  Take a multivitamin, if told by your health care provider.  Do not use herbal or dietary supplements unless they are approved by your health care provider. Activity  Exercise regularly, as told by your health care provider.  Use or practice techniques to help you relax, such as yoga, tai chi, meditation, or massage therapy. Eating and drinking   Avoid heavy meals in the evening.  Eat a well-balanced diet, which includes lean proteins, whole grains, plenty of fruits and vegetables, and low-fat dairy products.  Avoid consuming too much caffeine.  Avoid the use of alcohol.  Drink enough fluid to keep your urine pale yellow. Lifestyle  Change situations that cause you stress. Try to keep your work and personal schedule in balance.  Do not use any products that contain nicotine or tobacco, such as cigarettes and e-cigarettes. If you need help quitting, ask your health care provider.  Do not use drugs. Contact a health care provider if:  Your fatigue does not get better.  You have a  fever.  You suddenly lose or gain weight.  You have headaches.  You have trouble falling asleep or sleeping through the night.  You feel angry, guilty, anxious, or sad.  You are unable to have a bowel movement (constipation).  Your skin is dry.  You have swelling in your legs or another part of your body. Get help right away if:  You feel confused.  Your vision is blurry.  You feel faint or you pass out.  You have a severe headache.  You have severe pain in your abdomen, your back, or the area between your waist and hips (pelvis).  You have chest pain, shortness of breath, or an irregular or fast heartbeat.  You are unable to urinate, or you urinate less than normal.  You have abnormal bleeding, such as bleeding from the rectum, vagina, nose, lungs, or nipples.  You vomit blood.  You have thoughts about hurting yourself or others. If you ever feel like you may hurt yourself or others, or have thoughts about taking your own life, get help right away. You can go to your nearest emergency department or call:  Your local emergency services (911 in the [place]  A suicide crisis helpline, such as the [place] at 7408711477. This is open 24 hours a day. Summary  If you have fatigue, you feel tired all the time and have a lack of energy or a lack of motivation.  Fatigue may make it difficult to start or complete tasks because of exhaustion.  Long-lasting (chronic) or extreme fatigue may be a symptom of a medical condition.  Exercise regularly, as told by your health care provider.  Change situations that cause you stress. Try to keep your work and personal schedule in balance. This information is not intended to replace advice given to you by your health care provider. Make sure you discuss any questions you have with your health care provider. Document Revised: 10/30/2018 Document Reviewed: 01/03/2017 Elsevier Patient Education  2020  [employer].  Thoracic Strain A thoracic strain, which is sometimes called a mid-back strain, is an injury to the muscles or tendons that attach to the upper part of your back behind your chest. This type of injury occurs when a muscle is overstretched or overloaded. Thoracic strains can range from mild to severe. Mild strains may involve stretching a muscle or tendon without tearing it. These injuries may heal in 1-2 weeks. More severe strains involve tearing of muscle fibers or tendons. These will cause more pain and may take 6-8 weeks to heal. What are the causes? This condition may be caused by:  Trauma, such as  a fall or a hit to the body.  Twisting or overstretching the back. This may result from doing activities that require a lot of energy, such as lifting heavy objects. In some cases, the cause may not be known. What increases the risk? This injury is more common in:  Athletes.  People with obesity. What are the signs or symptoms? The main symptom of this condition is pain in the middle back, especially with movement. Other symptoms include:  Stiffness or limited range of motion.  Sudden muscle tightening (spasms). How is this diagnosed? This condition may be diagnosed based on:  Your symptoms.  Your medical history.  A physical exam.  Imaging tests, such as X-rays or an MRI. How is this treated? This condition may be treated with:  Resting the injured area.  Applying heat and cold to the injured area.  Over-the-counter medicines for pain and inflammation, such as NSAIDs.  Prescription pain medicine or muscle relaxants may be needed for a short time.  Physical therapy. This will involve doing stretching and strengthening exercises. Follow these instructions at home: Managing pain, stiffness, and swelling      If directed, put ice on the injured area. ? Put ice in a plastic bag. ? Place a towel between your skin and the bag. ? Leave the ice on for 20  minutes, 2-3 times a day.  If directed, apply heat to the affected area as often as told by your health care provider. Use the heat source that your health care provider recommends, such as a moist heat pack or a heating pad. ? Place a towel between your skin and the heat source. ? Leave the heat on for 20-30 minutes. ? Remove the heat if your skin turns bright red. This is especially important if you are unable to feel pain, heat, or cold. You may have a greater risk of getting burned. Activity  Rest and return to your normal activities as told by your health care provider. Ask your health care provider what activities are safe for you.  Do exercises as told by your health care provider. Medicines  Take over-the-counter and prescription medicines only as told by your health care provider.  Ask your health care provider if the medicine prescribed to you: ? Requires you to avoid driving or using heavy machinery. ? Can cause constipation. You may need to take these actions to prevent or treat constipation:  Drink enough fluid to keep your urine pale yellow.  Take over-the-counter or prescription medicines.  Eat foods that are high in fiber, such as beans, whole grains, and fresh fruits and vegetables.  Limit foods that are high in fat and processed sugars, such as fried or sweet foods. Injury prevention To prevent a future mid-back injury:  Always warm up properly before physical activity or sports.  Cool down and stretch after being active.  Use correct form when playing sports and lifting heavy objects. Bend your knees before you lift heavy objects.  Use good posture when sitting and standing.  Stay physically fit and maintain a healthy weight. ? Do at least 150 minutes of moderate-intensity exercise each week, such as brisk walking or water aerobics. ? Do strength exercises at least 2 times each week.  General instructions  Do not use any products that contain nicotine or  tobacco, such as cigarettes, e-cigarettes, and chewing tobacco. If you need help quitting, ask your health care provider.  Keep all follow-up visits as told by your health care provider.  This is important. Contact a health care provider if:  Your pain is not helped by medicine.  Your pain or stiffness is getting worse.  You develop pain or stiffness in your neck or lower back. Get help right away if you:  Have shortness of breath.  Have chest pain.  Develop numbness or weakness in your legs or arms.  Have involuntary loss of urine (urinary incontinence). Summary  A thoracic strain, which is sometimes called a mid-back strain, is an injury to the muscles or tendons that attach to the upper part of your back behind your chest.  This type of injury occurs when a muscle is overstretched or overloaded.  Rest and return to your normal activities as told by your health care provider. If directed, apply heat or ice to the affected area as often as told by your health care provider.  Take over-the-counter and prescription medicines only as told by your health care provider.  Contact a health care provider if you have new or worsening symptoms. This information is not intended to replace advice given to you by your health care provider. Make sure you discuss any questions you have with your health care provider. Document Revised: 02/26/2018 Document Reviewed: 02/26/2018 Elsevier Patient Education  2020 [employer].  Thoracic Strain Rehab Ask your health care provider which exercises are safe for you. Do exercises exactly as told by your health care provider and adjust them as directed. It is normal to feel mild stretching, pulling, tightness, or discomfort as you do these exercises. Stop right away if you feel sudden pain or your pain gets worse. Do not begin these exercises until told by your health care provider. Stretching and range-of-motion exercise This exercise warms up your  muscles and joints and improves the movement and flexibility of your back and shoulders. This exercise also helps to relieve pain. Chest and spine stretch  1. Lie down on your back on a firm surface. 2. Roll a towel or a small blanket so it is about 4 inches (10 cm) in diameter. 3. Put the towel lengthwise under the middle of your back so it is under your spine, but not under your shoulder blades. 4. Put your hands behind your head and let your elbows fall to your sides. This will increase your stretch. 5. Take a deep breath (inhale). 6. Hold for __________ seconds. 7. Relax after you breathe out (exhale). Repeat __________ times. Complete this exercise __________ times a day. Strengthening exercises These exercises build strength and endurance in your back and your shoulder blade muscles. Endurance is the ability to use your muscles for a long time, even after they get tired. Alternating arm and leg raises  1. Get on your hands and knees on a firm surface. If you are on a hard floor, you may want to use padding, such as an exercise mat, to cushion your knees. 2. Line up your arms and legs. Your hands should be directly below your shoulders, and your knees should be directly below your hips. 3. Lift your left leg behind you. At the same time, raise your right arm and straighten it in front of you. ? Do not lift your leg higher than your hip. ? Do not lift your arm higher than your shoulder. ? Keep your abdominal and back muscles tight. ? Keep your hips facing the ground. ? Do not arch your back. ? Keep your balance carefully, and do not hold your breath. 4. Hold for __________ seconds. 5.  Slowly return to the starting position and repeat with your right leg and your left arm. Repeat __________ times. Complete this exercise __________ times a day. Straight arm rows This exercise is also called shoulder extension exercise. 1. Stand with your feet shoulder width apart. 2. Secure an  exercise band to a stable object in front of you so the band is at or above shoulder height. 3. Hold one end of the exercise band in each hand. 4. Straighten your elbows and lift your hands up to shoulder height. 5. Step back, away from the secured end of the exercise band, until the band stretches. 6. Squeeze your shoulder blades together and pull your hands down to the sides of your thighs. Stop when your hands are straight down by your sides. This is shoulder extension. Do not let your hands go behind your body. 7. Hold for __________ seconds. 8. Slowly return to the starting position. Repeat __________ times. Complete this exercise __________ times a day. Prone shoulder external rotation 1. Lie on your abdomen on a firm bed so your left / right forearm hangs over the edge of the bed and your upper arm is on the bed, straight out from your body. This is the prone position. ? Your elbow should be bent. ? Your palm should be facing your feet. 2. If instructed, hold a __________ weight in your hand. 3. Squeeze your shoulder blade toward the middle of your back. Do not let your shoulder lift toward your ear. 4. Keep your elbow bent in a 90-degree angle (right angle) while you slowly move your forearm up toward the ceiling. Move your forearm up to the height of the bed, toward your head. This is external rotation. ? Your upper arm should not move. ? At the top of the movement, your palm should face the floor. 5. Hold for __________ seconds. 6. Slowly return to the starting position and relax your muscles. Repeat __________ times. Complete this exercise __________ times a day. Rowing scapular retraction This is an exercise in which the shoulder blades (scapulae) are pulled toward each other (retraction). 1. Sit in a stable chair without armrests, or stand up. 2. Secure an exercise band to a stable object in front of you so the band is at shoulder height. 3. Hold one end of the exercise band in  each hand. Your palms should face down. 4. Bring your arms out straight in front of you. 5. Step back, away from the secured end of the exercise band, until the band stretches. 6. Pull the band backward. As you do this, bend your elbows and squeeze your shoulder blades together, but avoid letting the rest of your body move. Do not shrug your shoulders upward while you do this. 7. Stop when your elbows are at your sides or slightly behind your body. 8. Hold for __________ seconds. 9. Slowly straighten your arms to return to the starting position. Repeat __________ times. Complete this exercise __________ times a day. Posture and body mechanics Good posture and healthy body mechanics can help to relieve stress in your body's tissues and joints. Body mechanics refers to the movements and positions of your body while you do your daily activities. Posture is part of body mechanics. Good posture means:  Your spine is in its natural S-curve position (neutral).  Your shoulders are pulled back slightly.  Your head is not tipped forward. Follow these guidelines to improve your posture and body mechanics in your everyday activities. Standing   When  standing, keep your spine neutral and your feet about hip width apart. Keep a slight bend in your knees. Your ears, shoulders, and hips should line up with each other.  When you do a task in which you lean forward while standing in one place for a long time, place one foot up on a stable object that is 2-4 inches (5-10 cm) high, such as a footstool. This helps keep your spine neutral. Sitting   When sitting, keep your spine neutral and keep your feet flat on the floor. Use a footrest, if necessary, and keep your thighs parallel to the floor. Avoid rounding your shoulders, and avoid tilting your head forward.  When working at a desk or a computer, keep your desk at a height where your hands are slightly lower than your elbows. Slide your chair under your  desk so you are close enough to maintain good posture.  When working at a computer, place your monitor at a height where you are looking straight ahead and you do not have to tilt your head forward or downward to look at the screen. Resting When lying down and resting, avoid positions that are most painful for you.  If you have pain with activities such as sitting, bending, stooping, or squatting (flexion-basedactivities), lie in a position in which your body does not bend very much. For example, avoid curling up on your side with your arms and knees near your chest (fetal position).  If you have pain with activities such as standing for a long time or reaching with your arms (extension-basedactivities), lie with your spine in a neutral position and bend your knees slightly. Try the following positions: ? Lie on your side with a pillow between your knees. ? Lie on your back with a pillow under your knees.  Lifting   When lifting objects, keep your feet at least shoulder width apart and tighten your abdominal muscles.  Bend your knees and hips and keep your spine neutral. It is important to lift using the strength of your legs, not your back. Do not lock your knees straight out.  Always ask for help to lift heavy or awkward objects. This information is not intended to replace advice given to you by your health care provider. Make sure you discuss any questions you have with your health care provider. Document Revised: 08/02/2018 Document Reviewed: 05/20/2018 Elsevier Patient Education  [place].

## 2019-10-17 LAB — BASIC METABOLIC PANEL
BUN: 12 mg/dL (ref 6–23)
CO2: 29 mEq/L (ref 19–32)
Calcium: 9.5 mg/dL (ref 8.4–10.5)
Chloride: 98 mEq/L (ref 96–112)
Creatinine, Ser: 0.6 mg/dL (ref 0.40–1.20)
GFR: 122.9 mL/min (ref >60.00)
Glucose, Bld: 77 mg/dL (ref 70–99)
Potassium: 4.1 mEq/L (ref 3.5–5.1)
Sodium: 140 mEq/L (ref 135–145)

## 2019-10-17 MED ORDER — VITAMIN D (ERGOCALCIFEROL) 1.25 MG (50000 UNIT) PO CAPS: 50000.0000 [IU] | ORAL_CAPSULE | ORAL | 0 refills | Status: DC

## 2019-10-20 ENCOUNTER — Telehealth: Payer: Self-pay | Admitting: Family Medicine

## 2019-10-20 NOTE — Telephone Encounter (Signed)
Pt was seen last week and Dr. Volanda Napoleon, stated to adjust her medications and for pt to start getting B-12 injections. Pt is not sure if she needs to come in to have her medication adjusted? Pt would also, like to know when can she start receiving her B-12 injection vaccines? Thanks

## 2019-10-21 ENCOUNTER — Ambulatory Visit: Payer: Self-pay

## 2019-10-21 ENCOUNTER — Ambulatory Visit (INDEPENDENT_AMBULATORY_CARE_PROVIDER_SITE_OTHER): Payer: BC Managed Care – PPO | Admitting: Orthopaedic Surgery

## 2019-10-21 VITALS — Ht 63.5 in | Wt 188.0 lb

## 2019-10-21 DIAGNOSIS — M25561 Pain in right knee: Secondary | ICD-10-CM

## 2019-10-21 NOTE — Progress Notes (Signed)
Office Visit Note   Patient: Maria Hammond           Date of Birth: 05-31-1960           MRN: [id number] Visit Date: 10/21/2019              Requested by: Billie Ruddy, MD [place],  [place] [address] PCP: Billie Ruddy, MD   Assessment & Plan: Visit Diagnoses:  1. Right knee pain, unspecified chronicity     Plan: Impression is right knee pain.  Not sure if the pain is actually due to the mass or if it is just referred pain from her OA.  We talked about cortisone injection today which she agreed to.  We will see how she responds to this injection.  If she does not get any relief we may need to consider MRI.  Follow-up as needed.  Follow-Up Instructions: Return if symptoms worsen or fail to improve.   Orders:  Orders Placed This Encounter  Procedures   XR KNEE 3 VIEW RIGHT   No orders of the defined types were placed in this encounter.     Procedures: No procedures performed   Clinical Data: No additional findings.   Subjective: Chief Complaint  Patient presents with   Right Knee - Pain    Maria Hammond comes in for right knee pain.  She feels like she has a medial bone spur.  She denies any weakness or giving way.  She has increased pain with walking or standing.  She is complaining of difficulty with bending.   Review of Systems  Constitutional: Negative.   HENT: Negative.   Eyes: Negative.   Respiratory: Negative.   Cardiovascular: Negative.   Endocrine: Negative.   Musculoskeletal: Negative.   Neurological: Negative.   Hematological: Negative.   Psychiatric/Behavioral: Negative.   All other systems reviewed and are negative.    Objective: Vital Signs: Ht 5' 3.5" (1.613 m)    Wt 188 lb (85.3 kg)    LMP 12/20/2011    BMI 32.78 kg/m   Physical Exam Vitals and nursing note reviewed.  Constitutional:      Appearance: She is well-developed.  HENT:     Head: Normocephalic and atraumatic.  Pulmonary:     Effort: Pulmonary  effort is normal.  Abdominal:     Palpations: Abdomen is soft.  Musculoskeletal:     Cervical back: Neck supple.  Skin:    General: Skin is warm.     Capillary Refill: Capillary refill takes less than 2 seconds.  Neurological:     Mental Status: She is alert and oriented to person, place, and time.  Psychiatric:        Behavior: Behavior normal.        Thought Content: Thought content normal.        Judgment: Judgment normal.     Ortho Exam Right knee shows a firm bony prominence on the medial side.  Does not feel cystic in nature.  She has no joint effusion.  Negative [place]. Specialty Comments:  No specialty comments available.  Imaging: XR KNEE 3 VIEW RIGHT  Result Date: 10/21/2019 Mild osteoarthritis    PMFS History: Patient Active Problem List   Diagnosis Date Noted   Unilateral primary osteoarthritis, left knee 01/16/2018   Trigger middle finger of right hand 07/21/2015   Obesity 01/26/2012   Essential hypertension 05/19/2007   Hypothyroidism 11/13/2006   Type 2 diabetes mellitus with hyperglycemia ([facility]) 11/13/2006  Hyperlipidemia 11/13/2006   Past Medical History:  Diagnosis Date   DIABETES MELLITUS, TYPE II 11/13/2006   HYPERLIPIDEMIA 11/13/2006   HYPERTENSION 05/19/2007   HYPOTHYROIDISM 11/13/2006   TOBACCO USE 12/16/2007   Quit 04/23/10      Family History  Problem Relation Age of Onset   Diabetes Mother    Heart disease Father    Kidney disease Father    Prostate cancer Father    Breast cancer Sister        age 16   Diabetes Brother    Diabetes Brother     Past Surgical History:  Procedure Laterality Date   CESAREAN SECTION     TONSILLECTOMY  1970   TRIGGER FINGER RELEASE     around 2012   Social History   Occupational History   Not on file  Tobacco Use   Smoking status: Former Smoker    Packs/day: 1.00    Years: 30.00    Pack years: 30.00    Types: Cigarettes    Quit date: 04/23/2010    Years since  quitting: 9.5   Smokeless tobacco: Never Used  Substance and Sexual Activity   Alcohol use: No    Alcohol/week: 0.0 standard drinks   Drug use: No   Sexual activity: Yes    Partners: Male

## 2019-10-21 NOTE — Telephone Encounter (Signed)
Spoke with pt state that she does not see Endocrinologist, state that Dr Volanda Napoleon can adjust the Levothyroxine and send new Rx to pt pharmacy, Pt also scheduled her 1st B12 injection on 10/23/2019.

## 2019-10-23 ENCOUNTER — Other Ambulatory Visit: Payer: Self-pay

## 2019-10-23 ENCOUNTER — Ambulatory Visit (INDEPENDENT_AMBULATORY_CARE_PROVIDER_SITE_OTHER): Payer: BC Managed Care – PPO

## 2019-10-23 DIAGNOSIS — E538 Deficiency of other specified B group vitamins: Secondary | ICD-10-CM | POA: Diagnosis not present

## 2019-10-23 MED ORDER — CYANOCOBALAMIN 1000 MCG/ML IJ SOLN
1000.0000 ug | Freq: Once | INTRAMUSCULAR | Status: AC
  Administered 2019-10-23: 1000 ug via INTRAMUSCULAR

## 2019-10-23 NOTE — Telephone Encounter (Signed)
Pt saw Endo, Dr. Cruzita Lederer on 08/27/19 see note in chart.

## 2019-10-23 NOTE — Progress Notes (Signed)
Per orders of Dr. Volanda Napoleon , injection of B12 given in Right deltoid by Franco Collet. Patient tolerated injection well.

## 2019-10-23 NOTE — Patient Instructions (Signed)
Health Maintenance Due  Topic Date Due  . Hepatitis C Screening  Never done  . FOOT EXAM  07/17/2017  . PAP SMEAR-Modifier  07/12/2018  . COLONOSCOPY  11/12/2018    Depression screen PHQ 2/9 10/08/2012  Decreased Interest 0  Down, Depressed, Hopeless 0  PHQ - 2 Score 0

## 2019-10-28 NOTE — Telephone Encounter (Signed)
Spoke with pt advised to call Endocrinology to adjust her thyroid medication, pt verbalized  understanding

## 2019-10-29 ENCOUNTER — Ambulatory Visit
Admission: RE | Admit: 2019-10-29 | Discharge: 2019-10-29 | Disposition: A | Discharge status: Home / Self Care | Payer: BC Managed Care – PPO | Source: Ambulatory Visit | Attending: Family Medicine | Admitting: Family Medicine

## 2019-10-29 ENCOUNTER — Other Ambulatory Visit: Payer: Self-pay

## 2019-10-29 DIAGNOSIS — Z1231 Encounter for screening mammogram for malignant neoplasm of breast: Secondary | ICD-10-CM

## 2019-10-30 ENCOUNTER — Encounter: Payer: Self-pay | Admitting: Gastroenterology

## 2019-10-30 ENCOUNTER — Telehealth: Payer: Self-pay | Admitting: Internal Medicine

## 2019-10-30 ENCOUNTER — Telehealth: Payer: Self-pay | Admitting: Family Medicine

## 2019-10-30 NOTE — Telephone Encounter (Signed)
Please advise 

## 2019-10-30 NOTE — Telephone Encounter (Signed)
Patient called stating she recently had labs done with her PCP (Dr Volanda Napoleon) and she said her thyroid levels were low - patient would like to speak to Dr Cruzita Lederer to discuss. Patient ph# [phone]

## 2019-10-30 NOTE — Telephone Encounter (Signed)
Pt is calling to see if it is okay for her to get the shingrix vaccine for the shingles.  Is it okay for the pt to schedule for the shingrix vaccine?

## 2019-10-30 NOTE — Telephone Encounter (Signed)
Chart reviewed:  Lab Results  Component Value Date   TSH 0.31 (L) 10/16/2019   Per PCP: Your TSH was slightly low at 0.31. Indicating you levothyroxine may need to be decreased, though some endocrinologist are ok with this level.  You can contact your Endocrinologist or we can make the adjustment for you

## 2019-10-30 NOTE — Telephone Encounter (Signed)
ok for Shingrix vaccine.

## 2019-10-30 NOTE — Telephone Encounter (Signed)
M, can you please advise her to decrease the levothyroxine dose to 50 mcg daily. Let's sent this dose to her pharmacy for 45 tabs with 5 refills.  We will need a TSH and a free T4 in 1.5 months - can you please order this?

## 2019-10-31 ENCOUNTER — Other Ambulatory Visit: Payer: Self-pay

## 2019-10-31 DIAGNOSIS — E039 Hypothyroidism, unspecified: Secondary | ICD-10-CM

## 2019-10-31 MED ORDER — LEVOTHYROXINE SODIUM 50 MCG PO TABS
50.0000 ug | ORAL_TABLET | Freq: Every day | ORAL | 3 refills | Status: DC
Start: 2019-10-31 — End: 2020-09-29

## 2019-10-31 NOTE — Telephone Encounter (Signed)
LM for patient to call back.

## 2019-10-31 NOTE — Telephone Encounter (Signed)
Patient notified and states she understands.  RX sent to her pharmacy.

## 2019-10-31 NOTE — Telephone Encounter (Signed)
Patient notified and verbalized understanding. Will get it done on 11/24/19 when she come in for her B 12 injection.

## 2019-11-05 ENCOUNTER — Telehealth: Payer: Self-pay | Admitting: Internal Medicine

## 2019-11-05 NOTE — Telephone Encounter (Signed)
Spoke with patient and she had already been notified.

## 2019-11-05 NOTE — Telephone Encounter (Signed)
Patient states she is returning your call and requests to be called at ph# [phone]

## 2019-11-19 LAB — HM DIABETES EYE EXAM

## 2019-11-24 ENCOUNTER — Other Ambulatory Visit: Payer: Self-pay

## 2019-11-24 ENCOUNTER — Ambulatory Visit (INDEPENDENT_AMBULATORY_CARE_PROVIDER_SITE_OTHER): Payer: BC Managed Care – PPO

## 2019-11-24 DIAGNOSIS — E538 Deficiency of other specified B group vitamins: Secondary | ICD-10-CM | POA: Diagnosis not present

## 2019-11-24 DIAGNOSIS — Z23 Encounter for immunization: Secondary | ICD-10-CM | POA: Diagnosis not present

## 2019-11-24 MED ORDER — CYANOCOBALAMIN 1000 MCG/ML IJ SOLN
1000.0000 ug | Freq: Once | INTRAMUSCULAR | Status: AC
  Administered 2019-11-24: 1000 ug via INTRAMUSCULAR

## 2019-11-24 NOTE — Patient Instructions (Signed)
Health Maintenance Due  Topic Date Due  . Hepatitis C Screening  Never done  . FOOT EXAM  07/17/2017  . PAP SMEAR-Modifier  07/12/2018  . COLONOSCOPY  11/12/2018  . OPHTHALMOLOGY EXAM  11/14/2019  . INFLUENZA VACCINE  11/23/2019    Depression screen PHQ 2/9 10/08/2012  Decreased Interest 0  Down, Depressed, Hopeless 0  PHQ - 2 Score 0

## 2019-11-24 NOTE — Progress Notes (Signed)
Per orders of Billie Ruddy, MD, injection of B12 given in Left deltoid and Shingrix vaccine in Left deltoid by Franco Collet. Patient tolerated injection well.  Lab Results  Component Value Date   VITAMINB12 193 (L) 10/16/2019

## 2019-12-11 ENCOUNTER — Encounter: Payer: Self-pay | Admitting: Internal Medicine

## 2019-12-22 ENCOUNTER — Other Ambulatory Visit: Payer: Self-pay | Admitting: Family Medicine

## 2019-12-24 ENCOUNTER — Other Ambulatory Visit: Payer: Self-pay | Admitting: Internal Medicine

## 2019-12-24 ENCOUNTER — Other Ambulatory Visit: Payer: Self-pay

## 2019-12-24 MED ORDER — GLIPIZIDE ER 2.5 MG PO TB24: ORAL_TABLET | ORAL | 3 refills | Status: DC

## 2019-12-25 ENCOUNTER — Ambulatory Visit (AMBULATORY_SURGERY_CENTER): Payer: Self-pay | Admitting: *Deleted

## 2019-12-25 ENCOUNTER — Other Ambulatory Visit: Payer: Self-pay

## 2019-12-25 ENCOUNTER — Ambulatory Visit (INDEPENDENT_AMBULATORY_CARE_PROVIDER_SITE_OTHER): Payer: BC Managed Care – PPO | Admitting: *Deleted

## 2019-12-25 ENCOUNTER — Ambulatory Visit: Payer: BC Managed Care – PPO

## 2019-12-25 ENCOUNTER — Encounter: Payer: Self-pay | Admitting: Gastroenterology

## 2019-12-25 VITALS — Ht 63.5 in | Wt 191.0 lb

## 2019-12-25 DIAGNOSIS — E538 Deficiency of other specified B group vitamins: Secondary | ICD-10-CM

## 2019-12-25 DIAGNOSIS — Z1211 Encounter for screening for malignant neoplasm of colon: Secondary | ICD-10-CM

## 2019-12-25 MED ORDER — CYANOCOBALAMIN 1000 MCG/ML IJ SOLN
1000.0000 ug | Freq: Once | INTRAMUSCULAR | Status: AC
  Administered 2019-12-25: 1000 ug via INTRAMUSCULAR

## 2019-12-25 MED ORDER — PLENVU 140 G PO SOLR: 1.0000 | Freq: Once | ORAL | 0 refills | Status: AC

## 2019-12-25 NOTE — Progress Notes (Signed)
Per orders of Dr. Banks, injection of Cyanocobalamin 1000mcg given by Hargun Spurling A. °Patient tolerated injection well. ° °

## 2019-12-25 NOTE — Progress Notes (Signed)
Patient is here in-person for PV. Patient denies any allergies to eggs or soy. Patient denies any problems with anesthesia/sedation. Patient denies any oxygen use at home. Patient denies taking any diet/weight loss medications or blood thinners. Patient is not being treated for MRSA or C-diff. Patient is aware of our care-partner policy and GBTDV-76 safety protocol. EMMI education sent to mychart pt is aware. COVID-19 vaccines completed per pt Prep Prescription coupon given to the patient.

## 2020-01-01 ENCOUNTER — Encounter: Payer: BC Managed Care – PPO | Admitting: Gastroenterology

## 2020-01-08 ENCOUNTER — Ambulatory Visit (AMBULATORY_SURGERY_CENTER): Payer: BC Managed Care – PPO | Admitting: Gastroenterology

## 2020-01-08 ENCOUNTER — Encounter: Payer: Self-pay | Admitting: Gastroenterology

## 2020-01-08 ENCOUNTER — Other Ambulatory Visit: Payer: Self-pay

## 2020-01-08 VITALS — BP 126/63 | HR 62 | Temp 97.1°F | Resp 13 | Ht 63.5 in | Wt 191.0 lb

## 2020-01-08 DIAGNOSIS — Z1211 Encounter for screening for malignant neoplasm of colon: Secondary | ICD-10-CM

## 2020-01-08 DIAGNOSIS — K635 Polyp of colon: Secondary | ICD-10-CM

## 2020-01-08 DIAGNOSIS — D125 Benign neoplasm of sigmoid colon: Secondary | ICD-10-CM

## 2020-01-08 MED ORDER — SODIUM CHLORIDE 0.9 % IV SOLN: 500.0000 mL | Freq: Once | INTRAVENOUS | Status: DC

## 2020-01-08 NOTE — Op Note (Signed)
[facility] Patient Name: Maria Hammond Procedure Date: 01/08/2020 9:18 AM MRN: [id number] Endoscopist: Mallie Mussel L. Loletha Carrow , MD Age: 59 Referring MD:  Date of Birth: 1960-05-01 Gender: Female Account #: [id number] Procedure:                Colonoscopy Indications:              Screening for colorectal malignant neoplasm (no                            adenomatous polyps on last colonoscopy 10/2008) Medicines:                Monitored Anesthesia Care Procedure:                Pre-Anesthesia Assessment:                           - Prior to the procedure, a History and Physical                            was performed, and patient medications and                            allergies were reviewed. The patient's tolerance of                            previous anesthesia was also reviewed. The risks                            and benefits of the procedure and the sedation                            options and risks were discussed with the patient.                            All questions were answered, and informed consent                            was obtained. Prior Anticoagulants: The patient has                            taken no previous anticoagulant or antiplatelet                            agents. ASA Grade Assessment: III - A patient with                            severe systemic disease. After reviewing the risks                            and benefits, the patient was deemed in                            satisfactory condition to undergo the procedure.  After obtaining informed consent, the colonoscope                            was passed under direct vision. Throughout the                            procedure, the patient's blood pressure, pulse, and                            oxygen saturations were monitored continuously. The                            Colonoscope was introduced through the anus and                            advanced to the  the terminal ileum, with                            identification of the appendiceal orifice and IC                            valve. The colonoscopy was performed without                            difficulty. The patient tolerated the procedure                            well. The quality of the bowel preparation was                            good. The terminal ileum, ileocecal valve,                            appendiceal orifice, and rectum were photographed.                            The bowel preparation used was SUPREP. Scope In: 9:35:52 AM Scope Out: 9:55:35 AM Scope Withdrawal Time: 0 hours 14 minutes 53 seconds  Total Procedure Duration: 0 hours 19 minutes 43 seconds  Findings:                 The perianal and digital rectal examinations were                            normal.                           The terminal ileum appeared normal.                           A diminutive polyp was found in the distal sigmoid                            colon. The polyp was sessile. The polyp was removed  with a cold snare. Resection and retrieval were                            complete.                           Internal hemorrhoids were found. The hemorrhoids                            were small.                           The exam was otherwise without abnormality on                            direct and retroflexion views. Complications:            No immediate complications. Estimated Blood Loss:     Estimated blood loss was minimal. Impression:               - The examined portion of the ileum was normal.                           - One diminutive polyp in the distal sigmoid colon,                            removed with a cold snare. Resected and retrieved.                           - Internal hemorrhoids.                           - The examination was otherwise normal on direct                            and retroflexion views. Recommendation:            - Patient has a contact number available for                            emergencies. The signs and symptoms of potential                            delayed complications were discussed with the                            patient. Return to normal activities tomorrow.                            Written discharge instructions were provided to the                            patient.                           - Resume previous diet.                           -  Continue present medications.                           - Await pathology results.                           - Repeat colonoscopy is recommended for                            surveillance. The colonoscopy date will be                            determined after pathology results from today's                            exam become available for review. Maylynn Orzechowski L. Loletha Carrow, MD 01/08/2020 10:05:32 AM This report has been signed electronically.

## 2020-01-08 NOTE — Progress Notes (Signed)
Called to room to assist during endoscopic procedure.  Patient ID and intended procedure confirmed with present staff. Received instructions for my participation in the procedure from the performing physician.  

## 2020-01-08 NOTE — Progress Notes (Signed)
VS-JK  Pt's states no medical or surgical changes since previsit or office visit.

## 2020-01-08 NOTE — Patient Instructions (Signed)
Handout on polyps and hemorrhoids given.    YOU HAD AN ENDOSCOPIC PROCEDURE TODAY AT THE [facility]:   Refer to the procedure report that was given to you for any specific questions about what was found during the examination.  If the procedure report does not answer your questions, please call your gastroenterologist to clarify.  If you requested that your care partner not be given the details of your procedure findings, then the procedure report has been included in a sealed envelope for you to review at your convenience later.  YOU SHOULD EXPECT: Some feelings of bloating in the abdomen. Passage of more gas than usual.  Walking can help get rid of the air that was put into your GI tract during the procedure and reduce the bloating. If you had a lower endoscopy (such as a colonoscopy or flexible sigmoidoscopy) you may notice spotting of blood in your stool or on the toilet paper. If you underwent a bowel prep for your procedure, you may not have a normal bowel movement for a few days.  Please Note:  You might notice some irritation and congestion in your nose or some drainage.  This is from the oxygen used during your procedure.  There is no need for concern and it should clear up in a day or so.  SYMPTOMS TO REPORT IMMEDIATELY:  Following lower endoscopy (colonoscopy or flexible sigmoidoscopy):  Excessive amounts of blood in the stool  Significant tenderness or worsening of abdominal pains  Swelling of the abdomen that is new, acute  Fever of 100F or higher   For urgent or emergent issues, a gastroenterologist can be reached at any hour by calling [phone]. Do not use MyChart messaging for urgent concerns.    DIET:  We do recommend a small meal at first, but then you may proceed to your regular diet.  Drink plenty of fluids but you should avoid alcoholic beverages for 24 hours.  ACTIVITY:  You should plan to take it easy for the rest of today and you should NOT DRIVE  or use heavy machinery until tomorrow (because of the sedation medicines used during the test).    FOLLOW UP: Our staff will call the number listed on your records 48-72 hours following your procedure to check on you and address any questions or concerns that you may have regarding the information given to you following your procedure. If we do not reach you, we will leave a message.  We will attempt to reach you two times.  During this call, we will ask if you have developed any symptoms of COVID 19. If you develop any symptoms (ie: fever, flu-like symptoms, shortness of breath, cough etc.) before then, please call [phone].  If you test positive for Covid 19 in the 2 weeks post procedure, please call and report this information to us.    If any biopsies were taken you will be contacted by phone or by letter within the next 1-3 weeks.  Please call us at [phone] if you have not heard about the biopsies in 3 weeks.    SIGNATURES/CONFIDENTIALITY: You and/or your care partner have signed paperwork which will be entered into your electronic medical record.  These signatures attest to the fact that that the information above on your After Visit Summary has been reviewed and is understood.  Full responsibility of the confidentiality of this discharge information lies with you and/or your care-partner.  

## 2020-01-08 NOTE — Progress Notes (Signed)
To PACU, VSS. Report to [email or web address] 

## 2020-01-12 ENCOUNTER — Telehealth: Payer: Self-pay | Admitting: *Deleted

## 2020-01-12 NOTE — Telephone Encounter (Signed)
°  Follow up Call-  Call back number 01/08/2020  Post procedure Call Back phone  # [phone]  Permission to leave phone message Yes  Some recent data might be hidden     Patient questions:  Do you have a fever, pain , or abdominal swelling? No. Pain Score  0 *  Have you tolerated food without any problems? Yes.    Have you been able to return to your normal activities? Yes.    Do you have any questions about your discharge instructions: Diet   No. Medications  No. Follow up visit  No.  Do you have questions or concerns about your Care? No.  Actions: * If pain score is 4 or above: No action needed, pain <4.  1. Have you developed a fever since your procedure? no  2.   Have you had an respiratory symptoms (SOB or cough) since your procedure? no  3.   Have you tested positive for COVID 19 since your procedure no  4.   Have you had any family members/close contacts diagnosed with the COVID 19 since your procedure?  no   If yes to any of these questions please route to Joylene John, RN and Joella Prince, RN

## 2020-01-14 ENCOUNTER — Encounter: Payer: Self-pay | Admitting: Gastroenterology

## 2020-02-24 ENCOUNTER — Other Ambulatory Visit: Payer: Self-pay

## 2020-02-24 ENCOUNTER — Ambulatory Visit (INDEPENDENT_AMBULATORY_CARE_PROVIDER_SITE_OTHER): Payer: BC Managed Care – PPO

## 2020-02-24 DIAGNOSIS — Z23 Encounter for immunization: Secondary | ICD-10-CM

## 2020-03-03 ENCOUNTER — Ambulatory Visit: Payer: BC Managed Care – PPO | Admitting: Internal Medicine

## 2020-03-22 ENCOUNTER — Encounter: Payer: Self-pay | Admitting: Family Medicine

## 2020-03-22 ENCOUNTER — Ambulatory Visit (INDEPENDENT_AMBULATORY_CARE_PROVIDER_SITE_OTHER): Payer: BC Managed Care – PPO | Admitting: Family Medicine

## 2020-03-22 ENCOUNTER — Other Ambulatory Visit: Payer: Self-pay

## 2020-03-22 VITALS — BP 120/60 | HR 70 | Temp 98.7°F | Wt 186.0 lb

## 2020-03-22 DIAGNOSIS — R319 Hematuria, unspecified: Secondary | ICD-10-CM | POA: Diagnosis not present

## 2020-03-22 DIAGNOSIS — M546 Pain in thoracic spine: Secondary | ICD-10-CM

## 2020-03-22 LAB — POCT URINALYSIS DIPSTICK
Bilirubin, UA: NEGATIVE
Glucose, UA: NEGATIVE
Ketones, UA: NEGATIVE
Leukocytes, UA: NEGATIVE
Nitrite, UA: NEGATIVE
Protein, UA: NEGATIVE
Spec Grav, UA: <=1.005 — AB (ref 1.010–1.025)
Urobilinogen, UA: 0.2 E.U./dL
pH, UA: 6.5 (ref 5.0–8.0)

## 2020-03-22 NOTE — Addendum Note (Signed)
Addended by: Marrion Coy on: 03/22/2020 03:58 PM   Modules accepted: Orders

## 2020-03-22 NOTE — Progress Notes (Signed)
Subjective:    Patient ID: Maria Hammond, female    DOB: 11/26/1960, 59 y.o.   MRN: [id number]  No chief complaint on file.   HPI Patient is a 59 year old female with past medical history significant for type 2 diabetes, HLD, HTN, hypothyroidism, and former tobacco use who was seen for acute concern.  Patient endorses left backslash flank pain x 6 days.  Patient states sensation started in left lateral back moved midline, and is now back to the left side.  Noted more as a soreness.  Patient drinking five16 ounce sodas per day.  May have water to take her medications.  Patient denies nausea, vomiting, suprapubic pain, fever, chills, dysuria.  Past Medical History:  Diagnosis Date  . DIABETES MELLITUS, TYPE II 11/13/2006  . HYPERLIPIDEMIA 11/13/2006  . HYPERTENSION 05/19/2007  . HYPOTHYROIDISM 11/13/2006  . TOBACCO USE 12/16/2007   Quit 04/23/10      No Known Allergies  ROS General: Denies fever, chills, night sweats, changes in weight, changes in appetite HEENT: Denies headaches, ear pain, changes in vision, rhinorrhea, sore throat CV: Denies CP, palpitations, SOB, orthopnea Pulm: Denies SOB, cough, wheezing GI: Denies abdominal pain, nausea, vomiting, diarrhea, constipation GU: Denies dysuria, hematuria, frequency, vaginal discharge Msk: Denies muscle cramps, joint pains  +L sided back pain Neuro: Denies weakness, numbness, tingling Skin: Denies rashes, bruising Psych: Denies depression, anxiety, hallucinations     Objective:    Blood pressure 120/60, pulse 70, temperature 98.7 F (37.1 C), temperature source Oral, weight 186 lb (84.4 kg), last menstrual period 12/20/2011, SpO2 97 %.   Gen. Pleasant, well-nourished, in no distress, normal affect   HEENT: /AT, face symmetric, conjunctiva clear, no scleral icterus, PERRLA, EOMI, nares patent without drainage Lungs: no accessory muscle use Cardiovascular: RRR, no peripheral edema Musculoskeletal: No TTP of cervical, thoracic,  lumbar, or paraspinal muscles.  No CVA tenderness.  No deformities, no cyanosis or clubbing, normal tone Neuro:  A&Ox3, CN II-XII intact, normal gait Skin:  Warm, no lesions/ rash   Wt Readings from Last 3 Encounters:  01/08/20 191 lb (86.6 kg)  12/25/19 191 lb (86.6 kg)  10/21/19 188 lb (85.3 kg)    Lab Results  Component Value Date   WBC 9.7 10/16/2019   HGB 13.3 10/16/2019   HCT 40.1 10/16/2019   PLT 271.0 10/16/2019   GLUCOSE 77 10/16/2019   CHOL 174 10/16/2019   TRIG 59.0 10/16/2019   HDL 62.70 10/16/2019   LDLDIRECT 66.0 07/13/2014   LDLCALC 99 10/16/2019   ALT 15 11/06/2018   AST 11 11/06/2018   NA 140 10/16/2019   K 4.1 10/16/2019   CL 98 10/16/2019   CREATININE 0.60 10/16/2019   BUN 12 10/16/2019   CO2 29 10/16/2019   TSH 0.31 (L) 10/16/2019   HGBA1C 6.5 (A) 08/27/2019   MICROALBUR 1.4 11/06/2018    Assessment/Plan:  Acute left-sided thoracic back pain  -suspect renal calculi.  Also consider renal cyst or mass given h/o former tobacco use-30 pk yrs. -UA with hematuria -pt advised to increase po intake of water and fluids.  Pt advised to cut down/avoid soda -pt not currently in pain.  Will send in rx for medication if needed. - Plan: POCT urinalysis dipstick, CT RENAL STONE STUDY  Hematuria, unspecified type  - Plan: Urine Microscopic Only  F/u prn for worsened or continued symptoms  Grier Mitts, MD

## 2020-03-22 NOTE — Addendum Note (Signed)
Addended by: Wyvonne Lenz on: 03/22/2020 03:10 PM   Modules accepted: Orders

## 2020-03-22 NOTE — Patient Instructions (Addendum)
Hematuria, Adult Hematuria is blood in the urine. Blood may be visible in the urine, or it may be identified with a test. This condition can be caused by infections of the bladder, urethra, kidney, or prostate. Other possible causes include:  Kidney stones.  Cancer of the urinary tract.  Too much calcium in the urine.  Conditions that are passed from parent to child (inherited conditions).  Exercise that requires a lot of energy. Infections can usually be treated with medicine, and a kidney stone usually will pass through your urine. If neither of these is the cause of your hematuria, more tests may be needed to identify the cause of your symptoms. It is very important to tell your health care provider about any blood in your urine, even if it is painless or the blood stops without treatment. Blood in the urine, when it happens and then stops and then happens again, can be a symptom of a very serious condition, including cancer. There is no pain in the initial stages of many urinary cancers. Follow these instructions at home: Medicines  Take over-the-counter and prescription medicines only as told by your health care provider.  If you were prescribed an antibiotic medicine, take it as told by your health care provider. Do not stop taking the antibiotic even if you start to feel better. Eating and drinking  Drink enough fluid to keep your urine clear or pale yellow. It is recommended that you drink 3-4 quarts (2.8-3.8 L) a day. If you have been diagnosed with an infection, it is recommended that you drink cranberry juice in addition to large amounts of water.  Avoid caffeine, tea, and carbonated beverages. These tend to irritate the bladder.  Avoid alcohol because it may irritate the prostate (men). General instructions  If you have been diagnosed with a kidney stone, follow your health care provider's instructions about straining your urine to catch the stone.  Empty your bladder  often. Avoid holding urine for long periods of time.  If you are female: ? After a bowel movement, wipe from front to back and use each piece of toilet paper only once. ? Empty your bladder before and after sex.  Pay attention to any changes in your symptoms. Tell your health care provider about any changes or any new symptoms.  It is your responsibility to get your test results. Ask your health care provider, or the department performing the test, when your results will be ready.  Keep all follow-up visits as told by your health care provider. This is important. Contact a health care provider if:  You develop back pain.  You have a fever.  You have nausea or vomiting.  Your symptoms do not improve after 3 days.  Your symptoms get worse. Get help right away if:  You develop severe vomiting and are unable take medicine without vomiting.  You develop severe pain in your back or abdomen even though you are taking medicine.  You pass a large amount of blood in your urine.  You pass blood clots in your urine.  You feel very weak or like you might faint.  You faint. Summary  Hematuria is blood in the urine. It has many possible causes.  It is very important that you tell your health care provider about any blood in your urine, even if it is painless or the blood stops without treatment.  Take over-the-counter and prescription medicines only as told by your health care provider.  Drink enough fluid to keep   your urine clear or pale yellow. This information is not intended to replace advice given to you by your health care provider. Make sure you discuss any questions you have with your health care provider. Document Revised: 09/04/2018 Document Reviewed: 05/13/2016 Elsevier Patient Education  [place].  Hematuria, Adult Hematuria is blood in the urine. Blood may be visible in the urine, or it may be identified with a test. This condition can be caused by infections  of the bladder, urethra, kidney, or prostate. Other possible causes include:  Kidney stones.  Cancer of the urinary tract.  Too much calcium in the urine.  Conditions that are passed from parent to child (inherited conditions).  Exercise that requires a lot of energy. Infections can usually be treated with medicine, and a kidney stone usually will pass through your urine. If neither of these is the cause of your hematuria, more tests may be needed to identify the cause of your symptoms. It is very important to tell your health care provider about any blood in your urine, even if it is painless or the blood stops without treatment. Blood in the urine, when it happens and then stops and then happens again, can be a symptom of a very serious condition, including cancer. There is no pain in the initial stages of many urinary cancers. Follow these instructions at home: Medicines  Take over-the-counter and prescription medicines only as told by your health care provider.  If you were prescribed an antibiotic medicine, take it as told by your health care provider. Do not stop taking the antibiotic even if you start to feel better. Eating and drinking  Drink enough fluid to keep your urine clear or pale yellow. It is recommended that you drink 3-4 quarts (2.8-3.8 L) a day. If you have been diagnosed with an infection, it is recommended that you drink cranberry juice in addition to large amounts of water.  Avoid caffeine, tea, and carbonated beverages. These tend to irritate the bladder.  Avoid alcohol because it may irritate the prostate (men). General instructions  If you have been diagnosed with a kidney stone, follow your health care provider's instructions about straining your urine to catch the stone.  Empty your bladder often. Avoid holding urine for long periods of time.  If you are female: ? After a bowel movement, wipe from front to back and use each piece of toilet paper only  once. ? Empty your bladder before and after sex.  Pay attention to any changes in your symptoms. Tell your health care provider about any changes or any new symptoms.  It is your responsibility to get your test results. Ask your health care provider, or the department performing the test, when your results will be ready.  Keep all follow-up visits as told by your health care provider. This is important. Contact a health care provider if:  You develop back pain.  You have a fever.  You have nausea or vomiting.  Your symptoms do not improve after 3 days.  Your symptoms get worse. Get help right away if:  You develop severe vomiting and are unable take medicine without vomiting.  You develop severe pain in your back or abdomen even though you are taking medicine.  You pass a large amount of blood in your urine.  You pass blood clots in your urine.  You feel very weak or like you might faint.  You faint. Summary  Hematuria is blood in the urine. It has many possible  causes.  It is very important that you tell your health care provider about any blood in your urine, even if it is painless or the blood stops without treatment.  Take over-the-counter and prescription medicines only as told by your health care provider.  Drink enough fluid to keep your urine clear or pale yellow. This information is not intended to replace advice given to you by your health care provider. Make sure you discuss any questions you have with your health care provider. Document Revised: 09/04/2018 Document Reviewed: 05/13/2016 Elsevier Patient Education  2020 [place].  Kidney Stones Kidney stones are rock-like masses that form inside of the kidneys. Kidneys are organs that make pee (urine). A kidney stone may move into other parts of the urinary tract, including:  The tubes that connect the kidneys to the bladder (ureters).  The bladder.  The tube that carries urine out of the body  (urethra). Kidney stones can cause very bad pain and can block the flow of pee. The stone usually leaves your body (passes) through your pee. You may need to have a doctor take out the stone. What are the causes? Kidney stones may be caused by:  A condition in which certain glands make too much parathyroid hormone (primary hyperparathyroidism).  A buildup of a type of crystals in the bladder made of a chemical called uric acid. The body makes uric acid when you eat certain foods.  Narrowing (stricture) of one or both of the ureters.  A kidney blockage that you were born with.  Past surgery on the kidney or the ureters, such as gastric bypass surgery. What increases the risk? You are more likely to develop this condition if:  You have had a kidney stone in the past.  You have a family history of kidney stones.  You do not drink enough water.  You eat a diet that is high in protein, salt (sodium), or sugar.  You are overweight or very overweight (obese). What are the signs or symptoms? Symptoms of a kidney stone may include:  Pain in the side of the belly, right below the ribs (flank pain). Pain usually spreads (radiates) to the groin.  Needing to pee often or right away (urgently).  Pain when going pee (urinating).  Blood in your pee (hematuria).  Feeling like you may vomit (nauseous).  Vomiting.  Fever and chills. How is this treated? Treatment depends on the size, location, and makeup of the kidney stones. The stones will often pass out of the body through peeing. You may need to:  Drink more fluid to help pass the stone. In some cases, you may be given fluids through an IV tube put into one of your veins at the hospital.  Take medicine for pain.  Make changes in your diet to help keep kidney stones from coming back. Sometimes, medical procedures are needed to remove a kidney stone. This may involve:  A procedure to break up kidney stones using a beam of light  (laser) or shock waves.  Surgery to remove the kidney stones. Follow these instructions at home: Medicines  Take over-the-counter and prescription medicines only as told by your doctor.  Ask your doctor if the medicine prescribed to you requires you to avoid driving or using heavy machinery. Eating and drinking  Drink enough fluid to keep your pee pale yellow. You may be told to drink at least 8-10 glasses of water each day. This will help you pass the stone.  If told by  your doctor, change your diet. This may include: ? Limiting how much salt you eat. ? Eating more fruits and vegetables. ? Limiting how much meat, poultry, fish, and eggs you eat.  Follow instructions from your doctor about eating or drinking restrictions. General instructions  Collect pee samples as told by your doctor. You may need to collect a pee sample: ? 24 hours after a stone comes out. ? 8-12 weeks after a stone comes out, and every 6-12 months after that.  Strain your pee every time you pee (urinate), for as long as told. Use the strainer that your doctor recommends.  Do not throw out the stone. Keep it so that it can be tested by your doctor.  Keep all follow-up visits as told by your doctor. This is important. You may need follow-up tests. How is this prevented? To prevent another kidney stone:  Drink enough fluid to keep your pee pale yellow. This is the best way to prevent kidney stones.  Eat healthy foods.  Avoid certain foods as told by your doctor. You may be told to eat less protein.  Stay at a healthy weight. Where to find more information  [place] ([facility]): www.kidney.[facility]): www.urologyhealth.org Contact a doctor if:  You have pain that gets worse or does not get better with medicine. Get help right away if:  You have a fever or chills.  You get very bad pain.  You get new pain in your belly (abdomen).  You pass out (faint).  You  cannot pee. Summary  Kidney stones are rock-like masses that form inside of the kidneys.  Kidney stones can cause very bad pain and can block the flow of pee.  The stones will often pass out of the body through peeing.  Drink enough fluid to keep your pee pale yellow. This information is not intended to replace advice given to you by your health care provider. Make sure you discuss any questions you have with your health care provider. Document Revised: 08/27/2018 Document Reviewed: 08/27/2018 Elsevier Patient Education  [place].  Vitamin B12 Deficiency Vitamin B12 deficiency means that your body does not have enough vitamin B12. The body needs this vitamin:  To make red blood cells.  To make genes (DNA).  To help the nerves work. If you do not have enough vitamin B12 in your body, you can have health problems. What are the causes?  Not eating enough foods that contain vitamin B12.  Not being able to absorb vitamin B12 from the food that you eat.  Certain digestive system diseases.  A condition in which the body does not make enough of a certain protein, which results in too few red blood cells (pernicious anemia).  Having a surgery in which part of the stomach or small intestine is removed.  Taking medicines that make it hard for the body to absorb vitamin B12. These medicines include: ? Heartburn medicines. ? Some antibiotic medicines. ? Other medicines that are used to treat certain conditions. What increases the risk?  Being older than age 82.  Eating a vegetarian or vegan diet, especially while you are pregnant.  Eating a poor diet while you are pregnant.  Taking certain medicines.  Having alcoholism. What are the signs or symptoms? In some cases, there are no symptoms. If the condition leads to too few blood cells or nerve damage, symptoms can occur, such as:  Feeling weak.  Feeling tired (fatigued).  Not being hungry.  Weight loss.  A  loss of feeling (numbness) or tingling in your hands and feet.  Redness and burning of the tongue.  Being mixed up (confused) or having memory problems.  Sadness (depression).  Problems with your senses. This can include color blindness, ringing in the ears, or loss of taste.  Watery poop (diarrhea) or trouble pooping (constipation).  Trouble walking. If anemia is very bad, symptoms can include:  Being short of breath.  Being dizzy.  Having a very fast heartbeat. How is this treated?  Changing the way you eat and drink, such as: ? Eating more foods that contain vitamin B12. ? Drinking little or no alcohol.  Getting vitamin B12 shots.  Taking vitamin B12 supplements. Your doctor will tell you the dose that is best for you. Follow these instructions at home: Eating and drinking   Eat lots of healthy foods that contain vitamin B12. These include: ? Meats and poultry, such as beef, pork, chicken, [place], and organ meats, such as liver. ? Seafood, such as clams, rainbow trout, salmon, tuna, and haddock. ? Eggs. ? Cereal and dairy products that have vitamin B12 added to them. Check the label. The items listed above may not be a complete list of what you can eat and drink. Contact a dietitian for more options. General instructions  Get any shots as told by your doctor.  Take supplements only as told by your doctor.  Do not drink alcohol if your doctor tells you not to. In some cases, you may only be asked to limit alcohol use.  Keep all follow-up visits as told by your doctor. This is important. Contact a doctor if:  Your symptoms come back. Get help right away if:  You have trouble breathing.  You have a very fast heartbeat.  You have chest pain.  You get dizzy.  You pass out. Summary  Vitamin B12 deficiency means that your body is not getting enough vitamin B12.  In some cases, there are no symptoms of this condition.  Treatment may include making a  change in the way you eat and drink, getting vitamin B12 shots, or taking supplements.  Eat lots of healthy foods that contain vitamin B12. This information is not intended to replace advice given to you by your health care provider. Make sure you discuss any questions you have with your health care provider. Document Revised: 12/18/2017 Document Reviewed: 12/18/2017 Elsevier Patient Education  2020 [employer].

## 2020-03-23 LAB — URINALYSIS, MICROSCOPIC ONLY
Bacteria, UA: NONE SEEN /HPF
Hyaline Cast: NONE SEEN /LPF
Squamous Epithelial / HPF: NONE SEEN /HPF (ref <=5)

## 2020-03-28 ENCOUNTER — Other Ambulatory Visit: Payer: Self-pay | Admitting: Family Medicine

## 2020-03-30 ENCOUNTER — Ambulatory Visit (INDEPENDENT_AMBULATORY_CARE_PROVIDER_SITE_OTHER): Payer: BC Managed Care – PPO | Admitting: Family Medicine

## 2020-03-30 ENCOUNTER — Other Ambulatory Visit: Payer: Self-pay

## 2020-03-30 ENCOUNTER — Encounter: Payer: Self-pay | Admitting: Family Medicine

## 2020-03-30 ENCOUNTER — Ambulatory Visit (INDEPENDENT_AMBULATORY_CARE_PROVIDER_SITE_OTHER): Payer: BC Managed Care – PPO

## 2020-03-30 VITALS — BP 130/60 | HR 92 | Temp 98.6°F | Ht 63.5 in | Wt 182.0 lb

## 2020-03-30 DIAGNOSIS — G8929 Other chronic pain: Secondary | ICD-10-CM

## 2020-03-30 DIAGNOSIS — M546 Pain in thoracic spine: Secondary | ICD-10-CM

## 2020-03-30 MED ORDER — METHYLPREDNISOLONE 4 MG PO TBPK: ORAL_TABLET | ORAL | 0 refills | Status: DC

## 2020-03-30 MED ORDER — CYCLOBENZAPRINE HCL 10 MG PO TABS: 10.0000 mg | ORAL_TABLET | Freq: Three times a day (TID) | ORAL | 0 refills | Status: DC | PRN

## 2020-03-30 NOTE — Progress Notes (Signed)
   Subjective:    Patient ID: Maria Hammond, female    DOB: [birth year], 59 y.o.   MRN: [id number]  HPI Here for 3 weeks of a steady sharp pain in the left middle back that sometimes radiates around the left side. No hx of trauma. No SOB. No rashes. This is the same pain she had for 4 weeks in June that went away. Heat helps this pain but not Ibuprofen. Movement, especially bending forward makes the pain worse. She saw Dr. Volanda Napoleon on 03-22-20 and a UA was clear. She has a renal CT pending for 04-13-20 to rule out kidney stones. No urinary or bowel symptoms.    Review of Systems  Constitutional: Negative.   Respiratory: Negative.   Cardiovascular: Negative.   Gastrointestinal: Negative.   Genitourinary: Negative.   Musculoskeletal: Positive for back pain.       Objective:   Physical Exam Constitutional:      General: She is not in acute distress.    Appearance: Normal appearance.  Cardiovascular:     Rate and Rhythm: Normal rate and regular rhythm.     Pulses: Normal pulses.     Heart sounds: Normal heart sounds.  Pulmonary:     Effort: Pulmonary effort is normal.     Breath sounds: Normal breath sounds.  Musculoskeletal:     Comments: She is tender over the upper thoracic spine and more so just to the left of the spine. ROM is full but flexion of the spine increases the pain   Skin:    Findings: No rash.  Neurological:     Mental Status: She is alert.           Assessment & Plan:  Middle back pain, likely due to a pinched nerve in the spine. We will get Xrays of her thoracic spine today. She can use heat, Flexeril and a Medrol dose pack. Written out of work yesterday and today.  Alysia Penna, MD

## 2020-04-13 ENCOUNTER — Ambulatory Visit
Admission: RE | Admit: 2020-04-13 | Discharge: 2020-04-13 | Disposition: A | Discharge status: Home / Self Care | Payer: BC Managed Care – PPO | Source: Ambulatory Visit | Attending: Family Medicine | Admitting: Family Medicine

## 2020-04-13 DIAGNOSIS — M546 Pain in thoracic spine: Secondary | ICD-10-CM

## 2020-04-14 ENCOUNTER — Other Ambulatory Visit: Payer: Self-pay | Admitting: Family Medicine

## 2020-04-14 DIAGNOSIS — N200 Calculus of kidney: Secondary | ICD-10-CM

## 2020-04-15 ENCOUNTER — Ambulatory Visit: Payer: BC Managed Care – PPO | Admitting: Internal Medicine

## 2020-04-29 ENCOUNTER — Encounter: Payer: Self-pay | Admitting: Internal Medicine

## 2020-04-29 ENCOUNTER — Other Ambulatory Visit: Payer: Self-pay

## 2020-04-29 ENCOUNTER — Ambulatory Visit (INDEPENDENT_AMBULATORY_CARE_PROVIDER_SITE_OTHER): Payer: BC Managed Care – PPO | Admitting: Internal Medicine

## 2020-04-29 VITALS — BP 110/60 | HR 85 | Ht 63.0 in | Wt 182.6 lb

## 2020-04-29 DIAGNOSIS — E039 Hypothyroidism, unspecified: Secondary | ICD-10-CM | POA: Diagnosis not present

## 2020-04-29 DIAGNOSIS — E785 Hyperlipidemia, unspecified: Secondary | ICD-10-CM | POA: Diagnosis not present

## 2020-04-29 DIAGNOSIS — E1165 Type 2 diabetes mellitus with hyperglycemia: Secondary | ICD-10-CM | POA: Diagnosis not present

## 2020-04-29 LAB — T4, FREE: Free T4: 1.07 ng/dL (ref 0.60–1.60)

## 2020-04-29 LAB — POCT GLYCOSYLATED HEMOGLOBIN (HGB A1C): Hemoglobin A1C: 6.9 % — AB (ref 4.0–5.6)

## 2020-04-29 LAB — TSH: TSH: 0.72 u[IU]/mL (ref 0.35–4.50)

## 2020-04-29 NOTE — Patient Instructions (Addendum)
Please continue: - Metformin ER 1000 mg 2x a day - Glipizide ER 2.5 mg in am, before b'fast  +/- 2.5 mg crushed tablet before large dinner  Please continue Levothyroxine 50 mcg daily.  Take the thyroid hormone every day, with water, at least 30 minutes before breakfast, separated by at least 4 hours from: - acid reflux medications - calcium - iron - multivitamins  Check sugars daily.  Please return in 4 months with your sugar log.

## 2020-04-29 NOTE — Progress Notes (Signed)
Patient ID: Maria Hammond, female   DOB: 29-Oct-1960, 60 y.o.   MRN: [id number]  This visit occurred during the SARS-CoV-2 public health emergency.  Safety protocols were in place, including screening questions prior to the visit, additional usage of staff PPE, and extensive cleaning of exam room while observing appropriate contact time as indicated for disinfecting solutions.   HPI: Maria Hammond is a 60 y.o.-year-old female, 60 returning for f/u for DM2, dx 2008, non-insulin-dependent, controlled, without long term complications. Last visit 8 months ago.  She had kidney stones since last OV.  She will need to have lithotripsy.  DM2: Reviewed HbA1c levels: Lab Results  Component Value Date   HGBA1C 6.5 (A) 08/27/2019   HGBA1C 6.2 (A) 02/24/2019   HGBA1C 6.5 (A) 11/06/2018   HGBA1C 6.9 (H) 08/12/2018   HGBA1C 6.1 (A) 02/25/2018   HGBA1C 6.6 (A) 10/22/2017   HGBA1C 7.7 (H) 07/19/2017   HGBA1C 7.2 03/13/2017   HGBA1C 7.0 12/11/2016   HGBA1C 7.9 (H) 07/10/2016   HGBA1C 8.1 05/29/2016   HGBA1C 6.5 01/27/2016   HGBA1C 6.3 10/25/2015   HGBA1C 8.7 (H) 07/05/2015   HGBA1C 7.7 05/31/2015   HGBA1C 7.4 02/26/2015   HGBA1C 7.1 11/23/2014   HGBA1C 7.2 (H) 08/24/2014   HGBA1C 8.0 (H) 05/25/2014   HGBA1C 7.6 (H) 02/23/2014  03/13/2017: HbA1c calculated from fructosamine 6.16%  Pt is on a regimen of: - Metformin ER 1000 mg 2x a day - Glipizide ER 2.5 mg in am, before b'fast +/- 2.5 mg crushed tablet before a large dinner She could not afford Januvia and Tradjenta. We stopped Amaryl 2 mg in 03/2013 as her sugars were at goal and even had some lows and got lightheaded 4 h after b'fast, while at work (resolved after having a snack).   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 Highest sugars: 180 ... >> 120 >> 176  Meter: ReliOn  Pt's meals are: - Breakfast (4 am): 3 strips [place] bacon + 1 egg + coffee - splenda and cream - snack: banana, nuts - Lunch: PB sandwich + bag of chips + yoghurt + fruit + diet Mtn Dew - snack: fruit and nuts - Dinner: sauerkraut + sausage  She works starting at 4 AM and has to wake up at 2 AM.  She works until approximately 12 PM.  She goes to bed around 8 PM.  -No CKD, last BUN/creatinine:  Lab Results  Component Value Date   BUN 12 10/16/2019   CREATININE 0.60 10/16/2019  On lisinopril. -+ HL; last set of lipids: Lab Results  Component Value Date   CHOL 174 10/16/2019   HDL 62.70 10/16/2019   LDLCALC 99 10/16/2019   LDLDIRECT 66.0 07/13/2014   TRIG 59.0 10/16/2019   CHOLHDL 3 10/16/2019  On Lipitor 10. - last eye exam  was on 11/19/2019: No DR.  Dr Tama High. - no numbness and tingling in her feet. She had Sx R big toe 02/2019. She saw podiatry in the past. She had numbness in fingertips >> resolved after B12 supplementation. She was found to have a B12 of 192 in 09/2019.  Hypothyroidism:  Reviewed her TFTs: Lab Results  Component Value Date   TSH 0.31 (L) 10/16/2019   TSH 0.57 08/12/2018   TSH 0.48 07/19/2017   Pt is on levothyroxine 50 mcg daily (dose decreased 10/2019), taken: - in am - fasting - at least 30 min from b'fast - no calcium - no iron - no multivitamins - no PPIs - not on Biotin  Pt denies: - feeling nodules in neck - hoarseness - dysphagia - choking - SOB with lying down  ROS: Constitutional: no weight gain/no weight loss, no fatigue, no subjective hyperthermia, no subjective hypothermia Eyes: no blurry vision, no  xerophthalmia ENT: no sore throat, + see HPI Cardiovascular: no CP/no SOB/no palpitations/no leg swelling Respiratory: no cough/no SOB/no wheezing Gastrointestinal: no N/no V/no D/no C/no acid reflux Musculoskeletal: no muscle aches/no joint aches, + back pain (2/2 kidney stones) Skin: no rashes, no hair loss Neurological: no tremors/no numbness/no tingling/no dizziness  I reviewed pt's medications, allergies, PMH, social hx, family hx, and changes were documented in the history of present illness. Otherwise, unchanged from my initial visit note.  Past Medical History:  Diagnosis Date   DIABETES MELLITUS, TYPE II 11/13/2006   HYPERLIPIDEMIA 11/13/2006   HYPERTENSION 05/19/2007   HYPOTHYROIDISM 11/13/2006   TOBACCO USE 12/16/2007   Quit 04/23/10     Past Surgical History:  Procedure Laterality Date   CESAREAN SECTION     COLONOSCOPY  11/11/2009   brodie   TONSILLECTOMY  1970   TRIGGER FINGER RELEASE     around 2012   Social History   Socioeconomic History   Marital status: Divorced    Spouse name: Not on file   Number of children: Not on file   Years of education: Not on file   Highest education level: Not on file  Occupational History   Not on file  Tobacco Use   Smoking status: Former Smoker    Packs/day: 1.00    Years: 30.00    Pack years: 30.00    Types: Cigarettes    Quit date: 04/23/2010    Years since quitting: 10.0   Smokeless tobacco: Never Used  Vaping Use   Vaping Use: Never used  Substance and Sexual Activity   Alcohol use: No    Alcohol/week: 0.0 standard drinks   Drug use: No   Sexual activity: Yes    Partners: Male  Other Topics Concern   Not on file  Social History Narrative   New relationship.    In process of divorce (starting 2015- still going 2018). 2 children- boy 81 autism goes to [facility] and works and girl 39 works at the jail in 2016. Son lives with her.  Husband no longer in house. Daughter in [place].       Works at  [employer]- 24 years in May 2018.       Hobbies: tv, facebook/internet. Church.       Regular exercise: not at this time   Caffeine use: Diet Mt Dew   Social Determinants of Health   Financial Resource Strain: Not on file  Food Insecurity: Not on file  Transportation Needs: Not on file  Physical Activity: Not on file  Stress: Not on  file  Social Connections: Not on file  Intimate Partner Violence: Not on file   Current Outpatient Medications on File Prior to Visit  Medication Sig Dispense Refill   atorvastatin (LIPITOR) 10 MG tablet Take 1 tablet by mouth once daily 90 tablet 0   cyclobenzaprine (FLEXERIL) 10 MG tablet Take 1 tablet (10 mg total) by mouth 3 (three) times daily as needed for muscle spasms. 60 tablet 0   glipiZIDE (GLUCOTROL XL) 2.5 MG 24 hr tablet TAKE 1 WHOLE TABLET BY MOUTH BEFORE BREAKFAST AND 1 CRUSHED TABLET BY MOUTH BEFORE DINNER 180 tablet 3   levothyroxine (SYNTHROID) 50 MCG tablet Take 1 tablet (50 mcg total) by mouth daily. 90 tablet 3   lisinopril (ZESTRIL) 5 MG tablet Take 1 tablet by mouth once daily 90 tablet 0   metFORMIN (GLUCOPHAGE-XR) 500 MG 24 hr tablet TAKE 2 TABLETS BY MOUTH TWICE DAILY WITH A MEAL 360 tablet 0   methylPREDNISolone (MEDROL DOSEPAK) 4 MG TBPK tablet As directed 21 tablet 0   Vitamin D, Ergocalciferol, (DRISDOL) 1.25 MG (50000 UNIT) CAPS capsule Take 1 capsule (50,000 Units total) by mouth every 7 (seven) days. 12 capsule 0   No current facility-administered medications on file prior to visit.   No Known Allergies Family History  Problem Relation Age of Onset   Diabetes Mother    Heart disease Father    Kidney disease Father    Prostate cancer Father    Breast cancer Sister        age 59   Diabetes Brother    Diabetes Brother    Colon cancer Neg Hx    Colon polyps Neg Hx    Esophageal cancer Neg Hx    Rectal cancer Neg Hx    Stomach cancer Neg Hx     PE: LMP 12/20/2011  There is no height or weight  on file to calculate BMI.  Wt Readings from Last 3 Encounters:  03/30/20 182 lb (82.6 kg)  03/22/20 186 lb (84.4 kg)  01/08/20 191 lb (86.6 kg)   Constitutional: overweight, in NAD Eyes: PERRLA, EOMI, no exophthalmos ENT: moist mucous membranes, no thyromegaly, no cervical lymphadenopathy Cardiovascular: RRR, No MRG Respiratory: CTA B Gastrointestinal: abdomen soft, NT, ND, BS+ Musculoskeletal: no deformities, strength intact in all 4 Skin: moist, warm, no rashes Neurological: no tremor with outstretched hands, DTR normal in all 4  ASSESSMENT: 1. DM2, non-insulin-dependent, controlled, without long term complications, but with occasional hyperglycemia -The Freestyle libre CGM was not covered by her insurance  2. Hypothyroidism  3. HL  PLAN:  1. Patient with fairly well-controlled type 2 diabetes, on metformin ER (had bloating with metformin IR) and sulfonylurea.  Sugars remain controlled on the low-dose glipizide ER in the morning and also before a larger meal.  At last visit HbA1c was slightly higher, at 6.5% and she was not checking sugars consistently due to numbness in her fingers.  We discussed about the importance of checking blood sugars but I also suggested to get the B12 vitamin checked by PCP since metformin can increase the level of this vitamin.  Indeed, she had a vitamin B12 checked in 09/2019 and this was low.  She was started on injections with B12 and then transitioned to oral supplementation.  Her hand numbness is almost resolved. -At today's visit, she only checked several times since last visit, mostly in November.  The sugars appear to be at goal.  She has several checks in the last month and they are  also at goal with only one high blood sugar of 142 in AM.  Therefore, for now, I advised him to continue the same regimen but I advised her to try to check her blood sugars daily, rotating check times.  She is working on her diet after the holidays. - I suggested to:   Patient Instructions  Please continue: - Metformin ER 1000 mg 2x a day - Glipizide ER 2.5 mg in am, before b'fast  +/- 2.5 mg crushed tablet before large dinner  Please continue Levothyroxine 50 mcg daily.  Take the thyroid hormone every day, with water, at least 30 minutes before breakfast, separated by at least 4 hours from: - acid reflux medications - calcium - iron - multivitamins  Check sugars daily.  Please return in 4 months with your sugar log.   - we checked her HbA1c: 6.9% (higher) - advised to check sugars at different times of the day - 1x a day, rotating check times - advised for yearly eye exams >> she is UTD - return to clinic in 4 months  2. Hypothyroidism - latest thyroid labs reviewed with pt >> TSH was low in 09/2019 so we decreased the dose of her levothyroxine: Lab Results  Component Value Date   TSH 0.31 (L) 10/16/2019   - she continues on LT4 50 mcg daily, decreased 10/2019 - pt feels good on this dose. - we discussed about taking the thyroid hormone every day, with water, >30 minutes before breakfast, separated by >4 hours from acid reflux medications, calcium, iron, multivitamins. Pt. is taking it correctly. - will check thyroid tests today: TSH and fT4 - If labs are abnormal, she will need to return for repeat TFTs in 1.5 months  3. HL -Reviewed latest lipid panel from 09/2019: All fractions at goal Lab Results  Component Value Date   CHOL 174 10/16/2019   HDL 62.70 10/16/2019   LDLCALC 99 10/16/2019   LDLDIRECT 66.0 07/13/2014   TRIG 59.0 10/16/2019   CHOLHDL 3 10/16/2019  -Continues Lipitor 10 without side effects  Component     Latest Ref Rng & Units 04/29/2020  Hemoglobin A1C     4.0 - 5.6 % 6.9 (A)  TSH     0.35 - 4.50 uIU/mL 0.72  T4,Free(Direct)     0.60 - 1.60 ng/dL 1.07  Normal TFTs.  Philemon Kingdom, MD PhD [facility] Endocrinology

## 2020-05-11 ENCOUNTER — Other Ambulatory Visit: Payer: Self-pay

## 2020-05-11 ENCOUNTER — Ambulatory Visit (INDEPENDENT_AMBULATORY_CARE_PROVIDER_SITE_OTHER): Payer: BC Managed Care – PPO | Admitting: Orthopaedic Surgery

## 2020-05-11 ENCOUNTER — Ambulatory Visit: Payer: Self-pay

## 2020-05-11 VITALS — Ht 63.5 in | Wt 182.0 lb

## 2020-05-11 DIAGNOSIS — M25561 Pain in right knee: Secondary | ICD-10-CM

## 2020-05-11 DIAGNOSIS — G8929 Other chronic pain: Secondary | ICD-10-CM

## 2020-05-11 MED ORDER — LIDOCAINE HCL 1 % IJ SOLN
2.0000 mL | INTRAMUSCULAR | Status: AC | PRN
  Administered 2020-05-11: 2 mL

## 2020-05-11 MED ORDER — BUPIVACAINE HCL 0.5 % IJ SOLN
2.0000 mL | INTRAMUSCULAR | Status: AC | PRN
  Administered 2020-05-11: 2 mL via INTRA_ARTICULAR

## 2020-05-11 MED ORDER — METHYLPREDNISOLONE ACETATE 40 MG/ML IJ SUSP
40.0000 mg | INTRAMUSCULAR | Status: AC | PRN
  Administered 2020-05-11: 40 mg via INTRA_ARTICULAR

## 2020-05-11 NOTE — Progress Notes (Signed)
Office Visit Note   Patient: Maria Hammond           Date of Birth: 17-Jun-1960           MRN: [id number] Visit Date: 05/11/2020              Requested by: Billie Ruddy, MD [place],  [place] [id number] PCP: Billie Ruddy, MD   Assessment & Plan: Visit Diagnoses:  1. Chronic pain of right knee     Plan: Impression is chronic right knee pain with painful mass on the medial side.  I think that she may have a parameniscal cyst from an underlying medial meniscal tear.  Therefore we will evaluate with an MRI.  In the meantime she did request a cortisone injection for some pain relief.  We will see her back after the MRI.  Follow-Up Instructions: Return if symptoms worsen or fail to improve.   Orders:  Orders Placed This Encounter  Procedures  . XR KNEE 3 VIEW RIGHT   No orders of the defined types were placed in this encounter.     Procedures: Large Joint Inj: R knee on 05/11/2020 3:05 PM Indications: pain Details: 22 G needle  Arthrogram: No  Medications: 40 mg methylPREDNISolone acetate 40 MG/ML; 2 mL lidocaine 1 %; 2 mL bupivacaine 0.5 % Consent was given by the patient. Patient was prepped and draped in the usual sterile fashion.       Clinical Data: No additional findings.   Subjective: Chief Complaint  Patient presents with  . Right Knee - Pain    Maria Hammond is a 60 year old female comes in for chronic right knee pain.  She feels a mass on the medial side that is very tender to touch.  Denies any injuries.  She reports stiffness.  No significant mechanical symptoms.   Review of Systems  Constitutional: Negative.   HENT: Negative.   Eyes: Negative.   Respiratory: Negative.   Cardiovascular: Negative.   Endocrine: Negative.   Musculoskeletal: Negative.   Neurological: Negative.   Hematological: Negative.   Psychiatric/Behavioral: Negative.   All other systems reviewed and are negative.    Objective: Vital Signs: Ht 5' 3.5"  (1.613 m)   Wt 182 lb (82.6 kg)   LMP 12/20/2011   BMI 31.73 kg/m   Physical Exam Vitals and nursing note reviewed.  Constitutional:      Appearance: She is well-developed and well-nourished.  Pulmonary:     Effort: Pulmonary effort is normal.  Skin:    General: Skin is warm.     Capillary Refill: Capillary refill takes less than 2 seconds.  Neurological:     Mental Status: She is alert and oriented to person, place, and time.  Psychiatric:        Mood and Affect: Mood and affect normal.        Behavior: Behavior normal.        Thought Content: Thought content normal.        Judgment: Judgment normal.     Ortho Exam Right knee shows no joint effusion.  She has catching with attempted range of motion.  She is very tender over the firm mass on the medial side.  This is nonmobile.  Medial joint line tenderness. Specialty Comments:  No specialty comments available.  Imaging: XR KNEE 3 VIEW RIGHT  Result Date: 05/11/2020 Mild osteoarthritis.  Well-preserved medial compartment joint space.  Bony architecture appears normal.    PMFS History: Patient Active  Problem List   Diagnosis Date Noted  . Unilateral primary osteoarthritis, left knee 01/16/2018  . Trigger middle finger of right hand 07/21/2015  . Obesity 01/26/2012  . Essential hypertension 05/19/2007  . Hypothyroidism 11/13/2006  . Type 2 diabetes mellitus with hyperglycemia ([place]) 11/13/2006  . Hyperlipidemia 11/13/2006   Past Medical History:  Diagnosis Date  . DIABETES MELLITUS, TYPE II 11/13/2006  . HYPERLIPIDEMIA 11/13/2006  . HYPERTENSION 05/19/2007  . HYPOTHYROIDISM 11/13/2006  . TOBACCO USE 12/16/2007   Quit 04/23/10      Family History  Problem Relation Age of Onset  . Diabetes Mother   . Heart disease Father   . Kidney disease Father   . Prostate cancer Father   . Breast cancer Sister        age 69  . Diabetes Brother   . Diabetes Brother   . Colon cancer Neg Hx   . Colon polyps Neg Hx   .  Esophageal cancer Neg Hx   . Rectal cancer Neg Hx   . Stomach cancer Neg Hx     Past Surgical History:  Procedure Laterality Date  . CESAREAN SECTION    . COLONOSCOPY  11/11/2009   brodie  . TONSILLECTOMY  1970  . TRIGGER FINGER RELEASE     around 2012   Social History   Occupational History  . Not on file  Tobacco Use  . Smoking status: Former Smoker    Packs/day: 1.00    Years: 30.00    Pack years: 30.00    Types: Cigarettes    Quit date: 04/23/2010    Years since quitting: 10.0  . Smokeless tobacco: Never Used  Vaping Use  . Vaping Use: Never used  Substance and Sexual Activity  . Alcohol use: No    Alcohol/week: 0.0 standard drinks  . Drug use: No  . Sexual activity: Yes    Partners: Male

## 2020-05-11 NOTE — Addendum Note (Signed)
Addended byLaurann Montana on: 05/11/2020 03:24 PM   Modules accepted: Orders

## 2020-06-17 ENCOUNTER — Other Ambulatory Visit: Payer: Self-pay

## 2020-06-17 ENCOUNTER — Ambulatory Visit
Admission: RE | Admit: 2020-06-17 | Discharge: 2020-06-17 | Disposition: A | Discharge status: Home / Self Care | Payer: BC Managed Care – PPO | Source: Ambulatory Visit | Attending: Orthopaedic Surgery | Admitting: Orthopaedic Surgery

## 2020-06-17 DIAGNOSIS — G8929 Other chronic pain: Secondary | ICD-10-CM

## 2020-06-22 ENCOUNTER — Other Ambulatory Visit: Payer: Self-pay

## 2020-06-22 ENCOUNTER — Ambulatory Visit (INDEPENDENT_AMBULATORY_CARE_PROVIDER_SITE_OTHER): Payer: BC Managed Care – PPO | Admitting: Orthopaedic Surgery

## 2020-06-22 DIAGNOSIS — S83241A Other tear of medial meniscus, current injury, right knee, initial encounter: Secondary | ICD-10-CM

## 2020-06-22 DIAGNOSIS — M23006 Cystic meniscus, unspecified meniscus, right knee: Secondary | ICD-10-CM

## 2020-06-22 NOTE — Progress Notes (Signed)
Office Visit Note   Patient: Maria Hammond           Date of Birth: [birth year]           MRN: [id number] Visit Date: 06/22/2020              Requested by: Billie Ruddy, MD [place],   [id number] PCP: Billie Ruddy, MD   Assessment & Plan: Visit Diagnoses:  1. Acute medial meniscal tear, right, initial encounter   2. Perimeniscal cyst of right knee     Plan: MRI independently reviewed and interpreted shows radial tear of the anterior horn of the medial meniscus and fraying of the posterior horn.  There is a large septated parameniscal cyst as well as subchondral edema of the medial tibial plateau.  These findings were all reviewed with the patient and treatment options discussed at length including arthroscopic partial medial meniscectomy and removal cyst and subchondroplasty versus continued conservative treatment.  Based on discussion patient would like to continue to treat this conservatively.  She will think about a medial unloader brace.  She has had good relief from the prior cortisone injection.  We will see her back as needed.  Follow-Up Instructions: Return if symptoms worsen or fail to improve.   Orders:  No orders of the defined types were placed in this encounter.  No orders of the defined types were placed in this encounter.     Procedures: No procedures performed   Clinical Data: No additional findings.   Subjective: Chief Complaint  Patient presents with  . Other     Scan review    Maria Hammond returns today for MRI review of the right knee.  Overall she is feeling better.  Previous cortisone injection was January 19.   Review of Systems  Constitutional: Negative.   HENT: Negative.   Eyes: Negative.   Respiratory: Negative.   Cardiovascular: Negative.   Endocrine: Negative.   Musculoskeletal: Negative.   Neurological: Negative.   Hematological: Negative.   Psychiatric/Behavioral: Negative.   All other systems  reviewed and are negative.    Objective: Vital Signs: LMP 12/20/2011   Physical Exam Vitals and nursing note reviewed.  Constitutional:      Appearance: She is well-developed and well-nourished.  Pulmonary:     Effort: Pulmonary effort is normal.  Skin:    General: Skin is warm.     Capillary Refill: Capillary refill takes less than 2 seconds.  Neurological:     Mental Status: She is alert and oriented to person, place, and time.  Psychiatric:        Mood and Affect: Mood and affect normal.        Behavior: Behavior normal.        Thought Content: Thought content normal.        Judgment: Judgment normal.     Ortho Exam Right knee exam is unchanged. Specialty Comments:  No specialty comments available.  Imaging: No results found.   PMFS History: Patient Active Problem List   Diagnosis Date Noted  . Unilateral primary osteoarthritis, left knee 01/16/2018  . Trigger middle finger of right hand 07/21/2015  . Obesity 01/26/2012  . Essential hypertension 05/19/2007  . Hypothyroidism 11/13/2006  . Type 2 diabetes mellitus with hyperglycemia ([place]) 11/13/2006  . Hyperlipidemia 11/13/2006   Past Medical History:  Diagnosis Date  . DIABETES MELLITUS, TYPE II 11/13/2006  . HYPERLIPIDEMIA 11/13/2006  . HYPERTENSION 05/19/2007  . HYPOTHYROIDISM 11/13/2006  . TOBACCO  USE 12/16/2007   Quit 04/23/10      Family History  Problem Relation Age of Onset  . Diabetes Mother   . Heart disease Father   . Kidney disease Father   . Prostate cancer Father   . Breast cancer Sister        age 10  . Diabetes Brother   . Diabetes Brother   . Colon cancer Neg Hx   . Colon polyps Neg Hx   . Esophageal cancer Neg Hx   . Rectal cancer Neg Hx   . Stomach cancer Neg Hx     Past Surgical History:  Procedure Laterality Date  . CESAREAN SECTION    . COLONOSCOPY  11/11/2009   brodie  . TONSILLECTOMY  1970  . TRIGGER FINGER RELEASE     around 2012   Social History   Occupational  History  . Not on file  Tobacco Use  . Smoking status: Former Smoker    Packs/day: 1.00    Years: 30.00    Pack years: 30.00    Types: Cigarettes    Quit date: 04/23/2010    Years since quitting: 10.1  . Smokeless tobacco: Never Used  Vaping Use  . Vaping Use: Never used  Substance and Sexual Activity  . Alcohol use: No    Alcohol/week: 0.0 standard drinks  . Drug use: No  . Sexual activity: Yes    Partners: Male

## 2020-06-24 ENCOUNTER — Other Ambulatory Visit: Payer: Self-pay | Admitting: Family Medicine

## 2020-09-02 ENCOUNTER — Other Ambulatory Visit: Payer: Self-pay

## 2020-09-02 ENCOUNTER — Ambulatory Visit (INDEPENDENT_AMBULATORY_CARE_PROVIDER_SITE_OTHER): Payer: BC Managed Care – PPO | Admitting: Internal Medicine

## 2020-09-02 ENCOUNTER — Encounter: Payer: Self-pay | Admitting: Internal Medicine

## 2020-09-02 VITALS — BP 124/58 | HR 58 | Ht 63.5 in | Wt 189.2 lb

## 2020-09-02 DIAGNOSIS — E039 Hypothyroidism, unspecified: Secondary | ICD-10-CM | POA: Diagnosis not present

## 2020-09-02 DIAGNOSIS — E1165 Type 2 diabetes mellitus with hyperglycemia: Secondary | ICD-10-CM | POA: Diagnosis not present

## 2020-09-02 DIAGNOSIS — E785 Hyperlipidemia, unspecified: Secondary | ICD-10-CM

## 2020-09-02 LAB — POCT GLYCOSYLATED HEMOGLOBIN (HGB A1C): Hemoglobin A1C: 7.1 % — AB (ref 4.0–5.6)

## 2020-09-02 MED ORDER — RYBELSUS 7 MG PO TABS: 7.0000 mg | ORAL_TABLET | Freq: Every day | ORAL | 11 refills | Status: DC

## 2020-09-02 MED ORDER — FREESTYLE LIBRE 2 READER DEVI: 1.0000 | Freq: Every day | 0 refills | Status: DC

## 2020-09-02 MED ORDER — FREESTYLE LIBRE 2 SENSOR MISC: 1.0000 | 3 refills | Status: DC

## 2020-09-02 NOTE — Progress Notes (Signed)
Patient ID: Maria Hammond, female   DOB: [birth year], 60 y.o.   MRN: [id number]  This visit occurred during the SARS-CoV-2 public health emergency.  Safety protocols were in place, including screening questions prior to the visit, additional usage of staff PPE, and extensive cleaning of exam room while observing appropriate contact time as indicated for disinfecting solutions.   HPI: Maria Hammond is a 60 y.o.-year-old female, returning for f/u for DM2, dx 2008, non-insulin-dependent, controlled, without long term complications. Last visit 4 months ago.  Interim history: She has a history of kidney stones (largest 1.6 cm - in L kidney) -may need to have lithotripsy since last visit. No increased urination, blurry vision, nausea. Has sharp pains in R eye. She is eating more on the go now as she is more busy at work.  DM2: Reviewed HbA1c levels: Lab Results  Component Value Date   HGBA1C 6.9 (A) 04/29/2020   HGBA1C 6.5 (A) 08/27/2019   HGBA1C 6.2 (A) 02/24/2019   HGBA1C 6.5 (A) 11/06/2018   HGBA1C 6.9 (H) 08/12/2018   HGBA1C 6.1 (A) 02/25/2018   HGBA1C 6.6 (A) 10/22/2017   HGBA1C 7.7 (H) 07/19/2017   HGBA1C 7.2 03/13/2017   HGBA1C 7.0 12/11/2016   HGBA1C 7.9 (H) 07/10/2016   HGBA1C 8.1 05/29/2016   HGBA1C 6.5 01/27/2016   HGBA1C 6.3 10/25/2015   HGBA1C 8.7 (H) 07/05/2015   HGBA1C 7.7 05/31/2015   HGBA1C 7.4 02/26/2015   HGBA1C 7.1 11/23/2014   HGBA1C 7.2 (H) 08/24/2014   HGBA1C 8.0 (H) 05/25/2014  03/13/2017: HbA1c calculated from fructosamine 6.16%  Pt is on a regimen of: - Metformin ER 1000 mg 2x a day - Glipizide ER 2.5 mg in am, before b'fast +/- 2.5 mg crushed tablet before a large dinner She could not afford Januvia and Tradjenta. We stopped Amaryl 2 mg in 03/2013 as her sugars were at goal and even had some lows and got lightheaded 4 h after b'fast, while at work (resolved after having a snack).   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 Highest sugars: 180 .Marland Kitchen. 176 >> 299  Meter: ReliOn  Pt's meals are: - Breakfast (4 am): 3 strips [place] bacon + 1 egg + coffee - splenda and cream - snack: banana, nuts - Lunch: PB sandwich + bag of chips + yoghurt + fruit + diet Mtn Dew - snack: fruit and nuts - Dinner: sauerkraut + sausage  She works starting at 4 AM and has to wake up at 2 AM.  She works until approximately 12 PM.  She goes to bed around 8 PM.  -No CKD, last BUN/creatinine:  Lab Results  Component Value Date   BUN 12 10/16/2019   CREATININE 0.60 10/16/2019  On lisinopril. -+ HL; last set of lipids: Lab Results  Component Value Date   CHOL 174 10/16/2019   HDL 62.70  10/16/2019   [place] 99 10/16/2019   LDLDIRECT 66.0 07/13/2014   TRIG 59.0 10/16/2019   CHOLHDL 3 10/16/2019  On Lipitor 10. - last eye exam was on 11/19/2019: No DR.  Dr Lady Gary. - no numbness and tingling in her feet. She had Sx R big toe 02/2019. She saw podiatry in the past. She had numbness in fingertips >> resolved after B12 supplementation. She was found to have a B12 of 192 in 09/2019.  Hypothyroidism:  Reviewed her TFTs: Lab Results  Component Value Date   TSH 0.72 04/29/2020   TSH 0.31 (L) 10/16/2019   TSH 0.57 08/12/2018   Pt is on levothyroxine 50 mcg daily (dose decreased 10/2019), taken: - in am - fasting - at least 30 min from b'fast - no calcium - no iron - no multivitamins - no PPIs - not on Biotin  Pt denies: - feeling nodules in neck - hoarseness - dysphagia -  choking - SOB with lying down  ROS: + see HPI Constitutional: no weight gain/no weight loss, no fatigue, no subjective hyperthermia, no subjective hypothermia Eyes: no blurry vision, no xerophthalmia ENT: no sore throat, + see HPI Cardiovascular: no CP/no SOB/no palpitations/no leg swelling Respiratory: no cough/no SOB/no wheezing Gastrointestinal: no N/no V/no D/no C/no acid reflux Musculoskeletal: no muscle aches/no joint aches, + back pain (2/2 kidney stones), + R upper back pain and tingling Skin: no rashes, no hair loss Neurological: no tremors/no numbness/no tingling/no dizziness  I reviewed pt's medications, allergies, PMH, social hx, family hx, and changes were documented in the history of present illness. Otherwise, unchanged from my initial visit note.  Past Medical History:  Diagnosis Date  . DIABETES MELLITUS, TYPE II 11/13/2006  . HYPERLIPIDEMIA 11/13/2006  . HYPERTENSION 05/19/2007  . HYPOTHYROIDISM 11/13/2006  . TOBACCO USE 12/16/2007   Quit 04/23/10     Past Surgical History:  Procedure Laterality Date  . CESAREAN SECTION    . COLONOSCOPY  11/11/2009   brodie  . TONSILLECTOMY  1970  . TRIGGER FINGER RELEASE     around 2012   Social History   Socioeconomic History  . Marital status: Divorced    Spouse name: Not on file  . Number of children: Not on file  . Years of education: Not on file  . Highest education level: Not on file  Occupational History  . Not on file  Tobacco Use  . Smoking status: Former Smoker    Packs/day: 1.00    Years: 30.00    Pack years: 30.00    Types: Cigarettes    Quit date: 04/23/2010    Years since quitting: 10.3  . Smokeless tobacco: Never Used  Vaping Use  . Vaping Use: Never used  Substance and Sexual Activity  . Alcohol use: No    Alcohol/week: 0.0 standard drinks  . Drug use: No  . Sexual activity: Yes    Partners: Male  Other Topics Concern  . Not on file  Social History Narrative   New relationship.    In  process of divorce (starting 2015- still going 2018). 2 children- boy 57 autism goes to [facility] and works and girl 39 works at the jail in 2016. Son lives with her.  Husband no longer in house. Daughter in [place].       Works at [employer]- 24 years in May 2018.       Hobbies: tv, facebook/internet. Church.       Regular exercise: not at this time   Caffeine use:  Diet Mt Dew   Social Determinants of Health   Financial Resource Strain: Not on file  Food Insecurity: Not on file  Transportation Needs: Not on file  Physical Activity: Not on file  Stress: Not on file  Social Connections: Not on file  Intimate Partner Violence: Not on file   Current Outpatient Medications on File Prior to Visit  Medication Sig Dispense Refill  . atorvastatin (LIPITOR) 10 MG tablet Take 1 tablet by mouth once daily 90 tablet 0  . cyclobenzaprine (FLEXERIL) 10 MG tablet Take 1 tablet (10 mg total) by mouth 3 (three) times daily as needed for muscle spasms. (Patient not taking: Reported on 04/29/2020) 60 tablet 0  . glipiZIDE (GLUCOTROL XL) 2.5 MG 24 hr tablet TAKE 1 WHOLE TABLET BY MOUTH BEFORE BREAKFAST AND 1 CRUSHED TABLET BY MOUTH BEFORE DINNER 180 tablet 3  . levothyroxine (SYNTHROID) 50 MCG tablet Take 1 tablet (50 mcg total) by mouth daily. 90 tablet 3  . lisinopril (ZESTRIL) 5 MG tablet Take 1 tablet by mouth once daily 90 tablet 0  . metFORMIN (GLUCOPHAGE-XR) 500 MG 24 hr tablet TAKE 2 TABLETS BY MOUTH TWICE DAILY WITH A MEAL 360 tablet 0  . methylPREDNISolone (MEDROL DOSEPAK) 4 MG TBPK tablet As directed (Patient not taking: Reported on 04/29/2020) 21 tablet 0  . Vitamin D, Ergocalciferol, (DRISDOL) 1.25 MG (50000 UNIT) CAPS capsule Take 1 capsule (50,000 Units total) by mouth every 7 (seven) days. 12 capsule 0   No current facility-administered medications on file prior to visit.   No Known Allergies Family History  Problem Relation Age of Onset  . Diabetes Mother   . Heart disease Father   . Kidney disease  Father   . Prostate cancer Father   . Breast cancer Sister        age 10  . Diabetes Brother   . Diabetes Brother   . Colon cancer Neg Hx   . Colon polyps Neg Hx   . Esophageal cancer Neg Hx   . Rectal cancer Neg Hx   . Stomach cancer Neg Hx     PE: BP (!) 124/58 (BP Location: Right Arm, Patient Position: Sitting, Cuff Size: Normal)   Pulse (!) 58   Ht 5' 3.5" (1.613 m)   Wt 189 lb 3.2 oz (85.8 kg)   LMP 12/20/2011   SpO2 96%   BMI 32.99 kg/m  Body mass index is 32.99 kg/m.  Wt Readings from Last 3 Encounters:  09/02/20 189 lb 3.2 oz (85.8 kg)  05/11/20 182 lb (82.6 kg)  04/29/20 182 lb 9.6 oz (82.8 kg)   Constitutional: overweight, in NAD Eyes: PERRLA, EOMI, no exophthalmos ENT: moist mucous membranes, no thyromegaly, no cervical lymphadenopathy Cardiovascular: RRR, No MRG Respiratory: CTA B Gastrointestinal: abdomen soft, NT, ND, BS+ Musculoskeletal: no deformities, strength intact in all 4 Skin: moist, warm, no rashes Neurological: no tremor with outstretched hands, DTR normal in all 4  ASSESSMENT: 1. DM2, non-insulin-dependent, controlled, without long term complications, but with occasional hyperglycemia -The Freestyle libre CGM was not covered by her insurance  2. Hypothyroidism  3. HL  PLAN:  1. Patient with fairly well-controlled type 2 diabetes, On metformin ER (had bloating with metformin IR) and sulfonylurea.  Sugars remain controlled on the low-dose glipizide ER in the morning and also before a larger meal.  In the past, she was not checking blood sugars due to numbness in her fingers.  We checked a B12 and this was low in 09/2019 and  we started her on injections with B12 and then transition to oral supplements.  Her hand numbness is almost entirely resolved. -At last visit, she was not checking sugars consistently but whenever she checked, they appear to be at goal, with 1 exception.  HbA1c was higher, 6.9%, but still at goal.  We did not change her  regimen.  I did advise her to try to check sugars daily, rotating check times. -At today's visit, sugars are higher, as she relaxed her diet.  We discussed about improving this, but I also suggested to start either an SGLT2 inhibitor or GLP-1 receptor agonist.  She agrees to start Rybelsus (refuses injectables).  We will start at the low dose and increase as tolerated.  Discussed about benefits and potential side effects.  I am hoping that we can stop glipizide after starting Rybelsus, if sugars improved.  We will continue metformin for now. - I suggested to:  Patient Instructions  Please continue: - Metformin ER 1000 mg 2x a day - Glipizide ER 2.5 mg in am, before b'fast  +/- 2.5 mg crushed tablet before large dinner  Please start: - Rybelsus 3.5 mg daily before b'fast  x1 week, then increase to 7 mg daily  When you get to 7 mg Rybelsus, you may stop Glipizide.  Please continue Levothyroxine 50 mcg daily.  Take the thyroid hormone every day, with water, at least 30 minutes before breakfast, separated by at least 4 hours from: - acid reflux medications - calcium - iron - multivitamins  Please return in 3.5 months with your sugar log.    - we checked her HbA1c: 7.1% (higher) - advised to check sugars at different times of the day - 1x a day, rotating check times - advised for yearly eye exams >> she is UTD - she we will schedule an APE with PCP-we will have annual labs done - return to clinic in 3.5 months  2. Hypothyroidism - latest thyroid labs reviewed with pt >> normal: Lab Results  Component Value Date   TSH 0.72 04/29/2020   - she continues on LT4 50 mcg daily (decreased 2021) - pt feels good on this dose. - we discussed about taking the thyroid hormone every day, with water, >30 minutes before breakfast, separated by >4 hours from acid reflux medications, calcium, iron, multivitamins. Pt. is taking it correctly.  3. HL -Reviewed latest lipid panel from 09/2019: All  fractions at goal: Lab Results  Component Value Date   CHOL 174 10/16/2019   HDL 62.70 10/16/2019   LDLCALC 99 10/16/2019   LDLDIRECT 66.0 07/13/2014   TRIG 59.0 10/16/2019   CHOLHDL 3 10/16/2019  -Continues Lipitor 10 mg daily without side effects   Philemon Kingdom, MD PhD [facility] Endocrinology

## 2020-09-02 NOTE — Patient Instructions (Addendum)
Please continue: - Metformin ER 1000 mg 2x a day - Glipizide ER 2.5 mg in am, before b'fast  +/- 2.5 mg crushed tablet before large dinner  Please start: - Rybelsus 3.5 mg daily before b'fast  x1 week, then increase to 7 mg daily  When you get to 7 mg Rybelsus, you may stop Glipizide.  Please continue Levothyroxine 50 mcg daily.  Take the thyroid hormone every day, with water, at least 30 minutes before breakfast, separated by at least 4 hours from: - acid reflux medications - calcium - iron - multivitamins  Please return in 3.5 months with your sugar log.

## 2020-09-08 ENCOUNTER — Telehealth: Payer: Self-pay | Admitting: Internal Medicine

## 2020-09-08 DIAGNOSIS — E1165 Type 2 diabetes mellitus with hyperglycemia: Secondary | ICD-10-CM

## 2020-09-08 NOTE — Telephone Encounter (Signed)
Called and left a message for pt advising One touch meter is not on her med list and wanted to confirm she wanted a new rx sent in or would she prefer test strips for her Freestyle Libre 2 meter that she currently has?

## 2020-09-08 NOTE — Telephone Encounter (Signed)
Patient called to advise that after speaking with pharmacy if her One Touch Meter, One Touch Test Strips, Glipizide, and Metformin were to sent to the [facility] service they will be covered for her.  Call back # [phone]

## 2020-09-09 ENCOUNTER — Encounter: Payer: Self-pay | Admitting: Family Medicine

## 2020-09-10 ENCOUNTER — Other Ambulatory Visit: Payer: Self-pay

## 2020-09-10 MED ORDER — LISINOPRIL 5 MG PO TABS: 5.0000 mg | ORAL_TABLET | Freq: Every day | ORAL | 0 refills | Status: DC

## 2020-09-10 MED ORDER — ATORVASTATIN CALCIUM 10 MG PO TABS: 1.0000 | ORAL_TABLET | Freq: Every day | ORAL | 0 refills | Status: DC

## 2020-09-13 ENCOUNTER — Other Ambulatory Visit: Payer: Self-pay

## 2020-09-13 ENCOUNTER — Encounter: Payer: Self-pay | Admitting: Internal Medicine

## 2020-09-13 MED ORDER — ATORVASTATIN CALCIUM 10 MG PO TABS: 1.0000 | ORAL_TABLET | Freq: Every day | ORAL | 0 refills | Status: DC

## 2020-09-13 MED ORDER — LISINOPRIL 5 MG PO TABS: 5.0000 mg | ORAL_TABLET | Freq: Every day | ORAL | 0 refills | Status: DC

## 2020-09-13 NOTE — Telephone Encounter (Signed)
Patient called re: Patient states that she did not pick up RX's for Rybelsus and Freestyle [employer] due too they were too expensive.  Patient does request new RX's for the following:  OneTouch Meter  One Touch Test Strips  Glipizide AND   Metformin   Be sent to:  [facility]  Patient states she sent a MyChart message re: the above with the contact information for [facility]

## 2020-09-14 MED ORDER — BLOOD GLUCOSE METER KIT: PACK | 0 refills | Status: DC

## 2020-09-14 MED ORDER — GLIPIZIDE ER 2.5 MG PO TB24: ORAL_TABLET | ORAL | 3 refills | Status: DC

## 2020-09-14 MED ORDER — METFORMIN HCL ER 500 MG PO TB24: ORAL_TABLET | ORAL | 3 refills | Status: DC

## 2020-09-14 NOTE — Telephone Encounter (Signed)
Rx sent to preferred pharmacy.

## 2020-09-14 NOTE — Addendum Note (Signed)
Addended by: Lauralyn Primes on: 09/14/2020 09:12 PM   Modules accepted: Orders

## 2020-09-15 ENCOUNTER — Other Ambulatory Visit: Payer: Self-pay | Admitting: Urology

## 2020-09-15 ENCOUNTER — Other Ambulatory Visit: Payer: Self-pay | Admitting: Family Medicine

## 2020-09-15 DIAGNOSIS — Z1231 Encounter for screening mammogram for malignant neoplasm of breast: Secondary | ICD-10-CM

## 2020-09-16 ENCOUNTER — Other Ambulatory Visit (HOSPITAL_COMMUNITY): Payer: Self-pay | Admitting: Urology

## 2020-09-16 DIAGNOSIS — N200 Calculus of kidney: Secondary | ICD-10-CM

## 2020-09-24 NOTE — Progress Notes (Signed)
DUE TO COVID-19 ONLY ONE VISITOR IS ALLOWED TO COME WITH YOU AND STAY IN THE WAITING ROOM ONLY DURING PRE OP AND PROCEDURE DAY OF SURGERY. THE 1 VISITOR  MAY VISIT WITH YOU AFTER SURGERY IN YOUR PRIVATE ROOM DURING VISITING HOURS ONLY!  YOU NEED TO HAVE A COVID 19 TEST ON_______ @_______ , THIS TEST MUST BE DONE BEFORE SURGERY,  COVID TESTING SITE [place] [address], IT IS ON THE RIGHT GOING OUT [address] APPROXIMATELY  2 MINUTES PAST [employer] ON THE RIGHT. ONCE YOUR COVID TEST IS COMPLETED,  PLEASE BEGIN THE QUARANTINE INSTRUCTIONS AS OUTLINED IN YOUR HANDOUT.                Maria Hammond  09/24/2020   Your procedure is scheduled on:    Report to [facility] Main  Entrance     10/15/20   Report to admitting at    0730am  AM     Call this number if you have problems the morning of surgery [phone]    Remember: Do not eat food , candy gum or mints :After Midnight. You may have clear liquids from midnight until   0630 am    CLEAR LIQUID DIET   Foods Allowed                                                                       Coffee and tea, regular and decaf                              Plain Jell-O any favor except red or purple                                            Fruit ices (not with fruit pulp)                                      Iced Popsicles                                     Carbonated beverages, regular and diet                                    Cranberry, grape and apple juices Sports drinks like Gatorade Lightly seasoned clear broth or consume(fat free) Sugar, honey syrup   _____________________________________________________________________    BRUSH YOUR TEETH MORNING OF SURGERY AND RINSE YOUR MOUTH OUT, NO CHEWING GUM CANDY OR MINTS.     Take these medicines the morning of surgery with A SIP OF WATER:    Synthroid  DO NOT TAKE ANY DIABETIC MEDICATIONS DAY OF YOUR SURGERY  You may not have any metal on your body including hair pins and              piercings  Do not wear jewelry, make-up, lotions, powders or perfumes, deodorant             Do not wear nail polish on your fingernails.  Do not shave  48 hours prior to surgery.              Men may shave face and neck.   Do not bring valuables to the hospital. [place].  Contacts, dentures or bridgework may not be worn into surgery.  Leave suitcase in the car. After surgery it may be brought to your room.     Patients discharged the day of surgery will not be allowed to drive home. IF YOU ARE HAVING SURGERY AND GOING HOME THE SAME DAY, YOU MUST HAVE AN ADULT TO DRIVE YOU HOME AND BE WITH YOU FOR 24 HOURS. YOU MAY GO HOME BY TAXI OR UBER OR ORTHERWISE, BUT AN ADULT MUST ACCOMPANY YOU HOME AND STAY WITH YOU FOR 24 HOURS.  Name and phone number of your driver:  Special Instructions: N/A              Please read over the following fact sheets you were given: _____________________________________________________________________  [facility] - Preparing for Surgery Before surgery, you can play an important role.  Because skin is not sterile, your skin needs to be as free of germs as possible.  You can reduce the number of germs on your skin by washing with CHG (chlorahexidine gluconate) soap before surgery.  CHG is an antiseptic cleaner which kills germs and bonds with the skin to continue killing germs even after washing. Please DO NOT use if you have an allergy to CHG or antibacterial soaps.  If your skin becomes reddened/irritated stop using the CHG and inform your nurse when you arrive at Short Stay. Do not shave (including legs and underarms) for at least 48 hours prior to the first CHG shower.  You may shave your face/neck. Please follow these instructions carefully:  1.  Shower with CHG Soap the night before surgery and the  morning of Surgery.  2.  If you  choose to wash your hair, wash your hair first as usual with your  normal  shampoo.  3.  After you shampoo, rinse your hair and body thoroughly to remove the  shampoo.                           4.  Use CHG as you would any other liquid soap.  You can apply chg directly  to the skin and wash                       Gently with a scrungie or clean washcloth.  5.  Apply the CHG Soap to your body ONLY FROM THE NECK DOWN.   Do not use on face/ open                           Wound or open sores. Avoid contact with eyes, ears mouth and genitals (private parts).  Wash face,  Genitals (private parts) with your normal soap.             6.  Wash thoroughly, paying special attention to the area where your surgery  will be performed.  7.  Thoroughly rinse your body with warm water from the neck down.  8.  DO NOT shower/wash with your normal soap after using and rinsing off  the CHG Soap.                9.  Pat yourself dry with a clean towel.            10.  Wear clean pajamas.            11.  Place clean sheets on your bed the night of your first shower and do not  sleep with pets. Day of Surgery : Do not apply any lotions/deodorants the morning of surgery.  Please wear clean clothes to the hospital/surgery center.  FAILURE TO FOLLOW THESE INSTRUCTIONS MAY RESULT IN THE CANCELLATION OF YOUR SURGERY PATIENT SIGNATURE_________________________________  NURSE SIGNATURE__________________________________  ________________________________________________________________________

## 2020-09-28 ENCOUNTER — Other Ambulatory Visit: Payer: Self-pay | Admitting: Internal Medicine

## 2020-09-28 DIAGNOSIS — E039 Hypothyroidism, unspecified: Secondary | ICD-10-CM

## 2020-10-07 ENCOUNTER — Encounter (HOSPITAL_COMMUNITY): Payer: Self-pay

## 2020-10-07 ENCOUNTER — Encounter (HOSPITAL_COMMUNITY)
Admission: RE | Admit: 2020-10-07 | Discharge: 2020-10-07 | Disposition: A | Discharge status: Home / Self Care | Payer: BC Managed Care – PPO | Source: Ambulatory Visit | Attending: Urology | Admitting: Urology

## 2020-10-07 ENCOUNTER — Other Ambulatory Visit: Payer: Self-pay

## 2020-10-07 DIAGNOSIS — Z01818 Encounter for other preprocedural examination: Secondary | ICD-10-CM | POA: Diagnosis present

## 2020-10-07 HISTORY — DX: Unspecified osteoarthritis, unspecified site: M19.90

## 2020-10-07 HISTORY — DX: Personal history of urinary calculi: Z87.442

## 2020-10-07 LAB — BASIC METABOLIC PANEL
Anion gap: 6 (ref 5–15)
BUN: 16 mg/dL (ref 8–23)
CO2: 25 mmol/L (ref 22–32)
Calcium: 9.3 mg/dL (ref 8.9–10.3)
Chloride: 106 mmol/L (ref 98–111)
Creatinine, Ser: 0.61 mg/dL (ref 0.44–1.00)
GFR, Estimated: >60 mL/min (ref >60)
Glucose, Bld: 116 mg/dL — ABNORMAL HIGH (ref 70–99)
Potassium: 4.1 mmol/L (ref 3.5–5.1)
Sodium: 137 mmol/L (ref 135–145)

## 2020-10-07 LAB — CBC
HCT: 39.8 % (ref 36.0–46.0)
Hemoglobin: 12.7 g/dL (ref 12.0–15.0)
MCH: 28.2 pg (ref 26.0–34.0)
MCHC: 31.9 g/dL (ref 30.0–36.0)
MCV: 88.2 fL (ref 80.0–100.0)
Platelets: 282 10*3/uL (ref 150–400)
RBC: 4.51 MIL/uL (ref 3.87–5.11)
RDW: 13.4 % (ref 11.5–15.5)
WBC: 8.1 10*3/uL (ref 4.0–10.5)
nRBC: 0 % (ref 0.0–0.2)

## 2020-10-07 LAB — PROTIME-INR
INR: 1 (ref 0.8–1.2)
Prothrombin Time: 13.2 seconds (ref 11.4–15.2)

## 2020-10-07 LAB — GLUCOSE, CAPILLARY: Glucose-Capillary: 164 mg/dL — ABNORMAL HIGH (ref 70–99)

## 2020-10-07 NOTE — Progress Notes (Signed)
Anesthesia Review:  PCP: DR Volanda Napoleon  LOV 03/22/2020  DR Cruzita Lederer- LOV 09/02/20  Cardiologist : Chest x-ray : EKG : 10/07/20  Echo : Stress test: Cardiac Cath :  Activity level: can do a flight of stair swithout difficulty  Sleep Study/ CPAP : Fasting Blood Sugar :      / Checks Blood Sugar -- times a day:   Blood Thinner/ Instructions /Last Dose: ASA / Instructions/ Last Dose :   DM- type 2 - does not check glucose on a regular basis  Hgba1c-09/02/20- 7.1

## 2020-10-12 ENCOUNTER — Other Ambulatory Visit (HOSPITAL_COMMUNITY)
Admission: RE | Admit: 2020-10-12 | Discharge: 2020-10-12 | Disposition: A | Discharge status: Home / Self Care | Payer: BC Managed Care – PPO | Source: Ambulatory Visit | Attending: Urology | Admitting: Urology

## 2020-10-12 DIAGNOSIS — Z20822 Contact with and (suspected) exposure to covid-19: Secondary | ICD-10-CM | POA: Insufficient documentation

## 2020-10-12 DIAGNOSIS — Z01812 Encounter for preprocedural laboratory examination: Secondary | ICD-10-CM | POA: Insufficient documentation

## 2020-10-12 LAB — SARS CORONAVIRUS 2 (TAT 6-24 HRS): SARS Coronavirus 2: NEGATIVE

## 2020-10-14 ENCOUNTER — Other Ambulatory Visit: Payer: Self-pay | Admitting: Student

## 2020-10-14 NOTE — H&P (Signed)
Chief Complaint: Patient was seen in consultation today for right PCN placement at the request of Wilkes D III  Referring Physician(s): Marton Redwood III  Supervising Physician: Aletta Edouard  Patient Status: [facility] - Out-pt  History of Present Illness: Maria Hammond is a 60 y.o. female with PMHs of HTN, HLD, DM 2, arthritis, and bilateral nephrolithiasis who is scheduled for right PCNL with Dr. Gloriann Loan in [place] today.  Patient states that she was having chronic back pain ans muscle spasms, thought that it was due to heavy lifting at work. Her PCP was concerned that the patient might have kidney stones, CT renal stone study was obtained on 04/13/20 which showed: 1. No signs of LEFT ureteral calculus with LEFT intrarenal calculus as described. Assessment of distal ureters limited due to number of small calcifications in the pelvis but without ureteral dilation, perinephric stranding or periureteral stranding. 2. Large branched calculus in the lower pole of the RIGHT kidney. Correlate with any previous or ongoing signs of urinary tract infection given association of branched calculi and struvite stone formation. 3. Small hiatal hernia. 4. Aortic atherosclerosis.  Patient established care with Dr. Gloriann Loan and discussed possible PCNL; however, the surgery could not be executed due to elective surgeries being canceled due to [place] pandemic. Per Dr. Purvis Sheffield note, he was concerned about hypoattenuating left posterior endophytic lesion which could possibly represented an infarct or a developing phlegmon/abscess.   Patient underwent repeat CT A/P w wo at [facility] on 07/05/20 which showed:  1. Unchanged bilateral nephrolithiasis with a partial staghorn calculus in the lower pole of the right kidney. No evidence of ureteral calculus or hydronephrosis. 2. No evidence of enhancing renal mass. Bilateral renal cysts. 3. No evidence of focal urothelial lesion. 4. Incidental pancreas divisum.  Lumbar spondylosis. Aortic Atherosclerosis (ICD10-I70.0).  Patient was seen by Dr. Gloriann Loan after the CT scan, after thorough discussion and shared decision making, patient decided to proceed with right PCNL.   IR was requested for a right PCN placement by urology.   Patient laying in bed, not in acute distress.  Denise headache, fever, chills, shortness of breath, cough, chest pain, abdominal pain, nausea ,vomiting, and bleeding.    Past Medical History:  Diagnosis Date   Arthritis    DIABETES MELLITUS, TYPE II 11/13/2006   History of kidney stones    HYPERLIPIDEMIA 11/13/2006   HYPERTENSION 05/19/2007   HYPOTHYROIDISM 11/13/2006   TOBACCO USE 12/16/2007   Quit 04/23/10      Past Surgical History:  Procedure Laterality Date   CESAREAN SECTION     COLONOSCOPY  11/11/2009   brodie   right foot bone spur surgery      TONSILLECTOMY  1970   TRIGGER FINGER RELEASE     around 2012    Allergies: Patient has no known allergies.  Medications: Prior to Admission medications   Medication Sig Start Date End Date Taking? Authorizing Provider  atorvastatin (LIPITOR) 10 MG tablet Take 1 tablet (10 mg total) by mouth daily. 09/13/20   Billie Ruddy, MD  blood glucose meter kit and supplies Use once daily as directed to check blood sugar. (FOR ICD-10 E10.9, E11.9). 09/14/20   Philemon Kingdom, MD  Cholecalciferol (VITAMIN D-3) 125 MCG (5000 UT) TABS Take 5,000 Units by mouth daily. Soft gel    [provider]  Continuous Blood Gluc Receiver (FREESTYLE LIBRE 2 READER) DEVI 1 each by Does not apply route daily. 09/02/20   Philemon Kingdom, MD  Continuous Blood Gluc  Sensor (FREESTYLE LIBRE 2 SENSOR) MISC 1 each by Does not apply route every 14 (fourteen) days. 09/02/20   Philemon Kingdom, MD  cyanocobalamin 1000 MCG tablet Take 1,000 mcg by mouth daily.    [provider]  cyclobenzaprine (FLEXERIL) 10 MG tablet Take 1 tablet (10 mg total) by mouth 3 (three) times daily as  needed for muscle spasms. Patient not taking: Reported on 10/05/2020 03/30/20   Laurey Morale, MD  glipiZIDE (GLUCOTROL XL) 2.5 MG 24 hr tablet TAKE 1 WHOLE TABLET BY MOUTH BEFORE BREAKFAST AND 1 CRUSHED TABLET BY MOUTH BEFORE DINNER Patient taking differently: Take 2.5 mg by mouth See admin instructions. Take 2.5 mg in the morning and depending on the meal at dinner will take a 2.5 mg crushed with heavy meal 09/14/20   Philemon Kingdom, MD  levothyroxine (SYNTHROID) 50 MCG tablet Take 1 tablet by mouth once daily Patient taking differently: Take 50 mcg by mouth daily before breakfast. 09/29/20   Philemon Kingdom, MD  lisinopril (ZESTRIL) 5 MG tablet Take 1 tablet (5 mg total) by mouth daily. 09/13/20   Billie Ruddy, MD  metFORMIN (GLUCOPHAGE-XR) 500 MG 24 hr tablet TAKE 2 TABLETS BY MOUTH TWICE DAILY WITH A MEAL Patient taking differently: Take 1,000 mg by mouth 2 (two) times daily. 09/14/20   Philemon Kingdom, MD  methylPREDNISolone (MEDROL DOSEPAK) 4 MG TBPK tablet As directed Patient not taking: Reported on 10/05/2020 03/30/20   Laurey Morale, MD  Semaglutide (RYBELSUS) 7 MG TABS Take 7 mg by mouth daily. Patient not taking: Reported on 10/05/2020 09/02/20   Philemon Kingdom, MD  Vitamin D, Ergocalciferol, (DRISDOL) 1.25 MG (50000 UNIT) CAPS capsule Take 1 capsule (50,000 Units total) by mouth every 7 (seven) days. Patient not taking: Reported on 10/05/2020 10/17/19   Billie Ruddy, MD     Family History  Problem Relation Age of Onset   Diabetes Mother    Heart disease Father    Kidney disease Father    Prostate cancer Father    Breast cancer Sister        age 31   Diabetes Brother    Diabetes Brother    Colon cancer Neg Hx    Colon polyps Neg Hx    Esophageal cancer Neg Hx    Rectal cancer Neg Hx    Stomach cancer Neg Hx     Social History   Socioeconomic History   Marital status: Divorced    Spouse name: Not on file   Number of children: Not on file   Years of education:  Not on file   Highest education level: Not on file  Occupational History   Not on file  Tobacco Use   Smoking status: Former    Packs/day: 1.00    Years: 30.00    Pack years: 30.00    Types: Cigarettes    Quit date: 04/23/2010    Years since quitting: 10.4   Smokeless tobacco: Never  Vaping Use   Vaping Use: Never used  Substance and Sexual Activity   Alcohol use: Never   Drug use: Never   Sexual activity: Yes    Partners: Male  Other Topics Concern   Not on file  Social History Narrative   New relationship.    In process of divorce (starting 2015- still going 2018). 2 children- boy 39 autism goes to [employer] and works and girl 39 works at the jail in 2016. Son lives with her.  Husband no longer in house.  Daughter in [place].       Works at [employer]- 24 years in May 2018.       Hobbies: tv, facebook/internet. Church.       Regular exercise: not at this time   Caffeine use: Diet Mt Dew   Social Determinants of Health   Financial Resource Strain: Not on file  Food Insecurity: Not on file  Transportation Needs: Not on file  Physical Activity: Not on file  Stress: Not on file  Social Connections: Not on file     Review of Systems: A 12 point ROS discussed and pertinent positives are indicated in the HPI above.  All other systems are negative.   Vital Signs: LMP 12/20/2011   Physical Exam  Vitals and nursing note reviewed.  Constitutional:      General: He is not in acute distress.    Appearance: Normal appearance.  HENT:     Head: Normocephalic and atraumatic.     Mouth/Throat:     Mouth: Mucous membranes are moist.     Pharynx: Oropharynx is clear.  Cardiovascular:     Rate and Rhythm: Normal rate and regular rhythm.     Pulses: Normal pulses.     Heart sounds: Normal heart sounds.  Pulmonary:     Effort: Pulmonary effort is normal.     Breath sounds: Normal breath sounds. No wheezing, rhonchi or rales.  Abdominal:     General: Bowel sounds are normal. There  is no distension.     Palpations: Abdomen is soft.  Skin:    General: Skin is warm and dry.  Neurological:     Mental Status: He is alert and oriented to person, place, and time.  Psychiatric:        Mood and Affect: Mood normal.        Behavior: Behavior normal.    MD Evaluation Airway: WNL Heart: WNL Abdomen: WNL Chest/ Lungs: WNL ASA  Classification: 2 Mallampati/Airway Score: Two  Imaging: No results found.  Labs:  CBC: Recent Labs    10/16/19 1409 10/07/20 0932 10/15/20 0802  WBC 9.7 8.1 7.4  HGB 13.3 12.7 13.0  HCT 40.1 39.8 39.8  PLT 271.0 282 261    COAGS: Recent Labs    10/07/20 0932 10/15/20 0802  INR 1.0 1.0    BMP: Recent Labs    10/16/19 1409 10/07/20 0932  NA 140 137  K 4.1 4.1  CL 98 106  CO2 29 25  GLUCOSE 77 116*  BUN 12 16  CALCIUM 9.5 9.3  CREATININE 0.60 0.61  GFRNONAA  --  >60    LIVER FUNCTION TESTS: No results for input(s): BILITOT, AST, ALT, ALKPHOS, PROT, ALBUMIN in the last 8760 hours.  TUMOR MARKERS: No results for input(s): AFPTM, CEA, CA199, CHROMGRNA in the last 8760 hours.  Assessment and Plan: 60 y.o. female with bilateral nephrolithiasis, with right staghorn nephrolithiasis.  Patient has been followed by Dr. Gloriann Loan since December 2021, PCNL was discussed with patient, however, the surgery could not be executed due to elective surgeries being canceled due to [place] pandemic.  Patient has a repeat CT on 07/05/20 which showed unchanged bilateral nephrolithiasis with a partial staghorn calculus in the lower pole of the right kidney.  After thorough discussion and shared decision making, patient decided to proceed with right PCNL.  IR was requested for an image guided right PCN placement prior to surgery.  Patient [facility] IR today for the procedure.  NPO since midnight  VSS  CBC WNL INR 1.0  Not on anticoag/antiplt treatment   Risks and benefits of right PCN placement was discussed with the patient  including, but not limited to, infection, bleeding, significant bleeding causing loss or decrease in renal function or damage to adjacent structures.   All of the patient's questions were answered, patient is agreeable to proceed.  Consent signed and in chart.   Risks and benefits of right PCN placement was discussed with the patient including, but not limited to, infection, bleeding, significant bleeding causing loss or decrease in renal function or damage to adjacent structures.   All of the patient's questions were answered, patient is agreeable to proceed.  Consent signed and in chart.  Thank you for this interesting consult.  I greatly enjoyed meeting Maria Hammond and look forward to participating in their care.  A copy of this report was sent to the requesting provider on this date.  Electronically Signed: Tera Mater, PA-C 10/15/2020, 8:41 AM   I spent a total of  30 Minutes   in face to face in clinical consultation, greater than 50% of which was counseling/coordinating care for right PCN placement.

## 2020-10-15 ENCOUNTER — Ambulatory Visit (HOSPITAL_COMMUNITY)
Admission: RE | Admit: 2020-10-15 | Discharge: 2020-10-15 | Disposition: A | Discharge status: Home / Self Care | Payer: BC Managed Care – PPO | Attending: Urology | Admitting: Urology

## 2020-10-15 ENCOUNTER — Ambulatory Visit (HOSPITAL_COMMUNITY)
Admission: RE | Admit: 2020-10-15 | Discharge: 2020-10-15 | Disposition: A | Discharge status: Home / Self Care | Payer: BC Managed Care – PPO | Source: Ambulatory Visit | Attending: Urology | Admitting: Urology

## 2020-10-15 ENCOUNTER — Encounter (HOSPITAL_COMMUNITY): Payer: Self-pay

## 2020-10-15 ENCOUNTER — Ambulatory Visit (HOSPITAL_COMMUNITY): Payer: BC Managed Care – PPO | Admitting: Anesthesiology

## 2020-10-15 ENCOUNTER — Other Ambulatory Visit: Payer: Self-pay

## 2020-10-15 ENCOUNTER — Encounter (HOSPITAL_COMMUNITY): Payer: Self-pay | Admitting: Urology

## 2020-10-15 ENCOUNTER — Encounter (HOSPITAL_COMMUNITY)
Admission: RE | Disposition: A | Discharge status: Home / Self Care | Payer: Self-pay | Source: Home / Self Care | Attending: Urology

## 2020-10-15 DIAGNOSIS — N200 Calculus of kidney: Secondary | ICD-10-CM | POA: Diagnosis not present

## 2020-10-15 DIAGNOSIS — E119 Type 2 diabetes mellitus without complications: Secondary | ICD-10-CM | POA: Diagnosis not present

## 2020-10-15 DIAGNOSIS — E785 Hyperlipidemia, unspecified: Secondary | ICD-10-CM | POA: Insufficient documentation

## 2020-10-15 DIAGNOSIS — I10 Essential (primary) hypertension: Secondary | ICD-10-CM | POA: Insufficient documentation

## 2020-10-15 DIAGNOSIS — Z87891 Personal history of nicotine dependence: Secondary | ICD-10-CM | POA: Insufficient documentation

## 2020-10-15 DIAGNOSIS — Z79899 Other long term (current) drug therapy: Secondary | ICD-10-CM | POA: Insufficient documentation

## 2020-10-15 DIAGNOSIS — Z79890 Hormone replacement therapy (postmenopausal): Secondary | ICD-10-CM | POA: Insufficient documentation

## 2020-10-15 DIAGNOSIS — Z7984 Long term (current) use of oral hypoglycemic drugs: Secondary | ICD-10-CM | POA: Insufficient documentation

## 2020-10-15 HISTORY — PX: IR NEPHROSTOMY PLACEMENT RIGHT: IMG6064

## 2020-10-15 LAB — CBC
HCT: 39.8 % (ref 36.0–46.0)
Hemoglobin: 13 g/dL (ref 12.0–15.0)
MCH: 28.4 pg (ref 26.0–34.0)
MCHC: 32.7 g/dL (ref 30.0–36.0)
MCV: 87.1 fL (ref 80.0–100.0)
Platelets: 261 10*3/uL (ref 150–400)
RBC: 4.57 MIL/uL (ref 3.87–5.11)
RDW: 13.3 % (ref 11.5–15.5)
WBC: 7.4 10*3/uL (ref 4.0–10.5)
nRBC: 0 % (ref 0.0–0.2)

## 2020-10-15 LAB — PROTIME-INR
INR: 1 (ref 0.8–1.2)
Prothrombin Time: 12.8 seconds (ref 11.4–15.2)

## 2020-10-15 LAB — GLUCOSE, CAPILLARY: Glucose-Capillary: 157 mg/dL — ABNORMAL HIGH (ref 70–99)

## 2020-10-15 LAB — TYPE AND SCREEN
ABO/RH(D): O POS
Antibody Screen: NEGATIVE

## 2020-10-15 LAB — ABO/RH: ABO/RH(D): O POS

## 2020-10-15 SURGERY — NEPHROLITHOTOMY PERCUTANEOUS
Anesthesia: General

## 2020-10-15 MED ORDER — LACTATED RINGERS IV SOLN: INTRAVENOUS | Status: DC

## 2020-10-15 MED ORDER — LIDOCAINE HCL 1 % IJ SOLN
INTRAMUSCULAR | Status: AC
  Filled 2020-10-15: qty 20

## 2020-10-15 MED ORDER — FENTANYL CITRATE (PF) 100 MCG/2ML IJ SOLN
INTRAMUSCULAR | Status: AC | PRN
  Administered 2020-10-15: 50 ug via INTRAVENOUS
  Administered 2020-10-15: 25 ug via INTRAVENOUS

## 2020-10-15 MED ORDER — CHLORHEXIDINE GLUCONATE 0.12 % MT SOLN
15.0000 mL | Freq: Once | OROMUCOSAL | Status: AC
  Administered 2020-10-15: 15 mL via OROMUCOSAL

## 2020-10-15 MED ORDER — CEFAZOLIN SODIUM-DEXTROSE 2-4 GM/100ML-% IV SOLN
2.0000 g | INTRAVENOUS | Status: AC
  Administered 2020-10-15: 2 g via INTRAVENOUS
  Filled 2020-10-15: qty 100

## 2020-10-15 MED ORDER — SODIUM CHLORIDE 0.9 % IV SOLN: INTRAVENOUS | Status: DC

## 2020-10-15 MED ORDER — MIDAZOLAM HCL 2 MG/2ML IJ SOLN
INTRAMUSCULAR | Status: AC | PRN
  Administered 2020-10-15: 1 mg via INTRAVENOUS
  Administered 2020-10-15: 0.5 mg via INTRAVENOUS

## 2020-10-15 MED ORDER — SCOPOLAMINE 1 MG/3DAYS TD PT72: 1.0000 | MEDICATED_PATCH | TRANSDERMAL | Status: DC

## 2020-10-15 MED ORDER — ORAL CARE MOUTH RINSE: 15.0000 mL | Freq: Once | OROMUCOSAL | Status: AC

## 2020-10-15 MED ORDER — MIDAZOLAM HCL 2 MG/2ML IJ SOLN
INTRAMUSCULAR | Status: AC
  Filled 2020-10-15: qty 2

## 2020-10-15 MED ORDER — FENTANYL CITRATE (PF) 100 MCG/2ML IJ SOLN
INTRAMUSCULAR | Status: AC
  Filled 2020-10-15: qty 2

## 2020-10-15 MED ORDER — SODIUM CHLORIDE 0.9 % IV SOLN: 1.0000 g | Freq: Once | INTRAVENOUS | Status: DC

## 2020-10-15 NOTE — Procedures (Signed)
Interventional Radiology Procedure Note  Procedure: Attempted percutaneous right renal access for PCNL  Complications: None  Estimated Blood Loss: < 10 mL  Findings: US shows no hydronephrosis and unable to access collecting system with needle passage under [place]. Clustered calculi in LP of right kidney targeted under fluoro. Able to opacify collecting system with contrast but LP infundibulum appears scarred/narrowed. Unable to pass guidewire past stones into infundibulum/renal pelvis. Did not feel that UP or mid-pole access would provide access to LP stones.  Percutaneous access attempts aborted. Discussed with Dr. Gloriann Loan.  Venetia Night. Kathlene Cote, M.D Pager:  [phone]

## 2020-10-15 NOTE — Discharge Instructions (Signed)
Urgent needs - Interventional Radiology on call MD [phone]  Wound - May remove dressing and shower tomorrow.  Keep site clean and dry.  Replace with bandaid as needed.  Do not submerge in tub or water until site healing well. If closed with glue, glue will flake off on its own.  Observe puncture site for signs of infection and notify MD for that or fever.

## 2020-10-15 NOTE — Anesthesia Preprocedure Evaluation (Deleted)
Anesthesia Evaluation  Patient identified by MRN, date of birth, ID band Patient awake    Reviewed: Allergy & Precautions, NPO status , Patient's Chart, lab work & pertinent test results  Airway Mallampati: II  TM Distance: >3 FB Neck ROM: Full    Dental  (+) Teeth Intact, Dental Advisory Given   Pulmonary former smoker,    Pulmonary exam normal        Cardiovascular hypertension,  Rhythm:Regular Rate:Normal     Neuro/Psych negative neurological ROS  negative psych ROS   GI/Hepatic negative GI ROS, Neg liver ROS,   Endo/Other  diabetes, Type 2, Oral Hypoglycemic AgentsHypothyroidism   Renal/GU negative Renal ROS     Musculoskeletal  (+) Arthritis ,   Abdominal Normal abdominal exam  (+)   Peds  Hematology   Anesthesia Other Findings   Reproductive/Obstetrics                             Anesthesia Physical Anesthesia Plan  ASA: 2  Anesthesia Plan: General   Post-op Pain Management:    Induction: Intravenous  PONV Risk Score and Plan: 4 or greater and Ondansetron, Dexamethasone, Midazolam and Scopolamine patch - Pre-op  Airway Management Planned: Oral ETT  Additional Equipment: None  Intra-op Plan:   Post-operative Plan: Extubation in OR  Informed Consent: I have reviewed the patients History and Physical, chart, labs and discussed the procedure including the risks, benefits and alternatives for the proposed anesthesia with the patient or authorized representative who has indicated his/her understanding and acceptance.     Dental advisory given  Plan Discussed with: CRNA  Anesthesia Plan Comments: (Case cancelled due to IR being unable to place wire for Urology.)       Anesthesia Quick Evaluation

## 2020-11-02 ENCOUNTER — Ambulatory Visit: Payer: BC Managed Care – PPO | Admitting: Orthopaedic Surgery

## 2020-11-04 ENCOUNTER — Ambulatory Visit (INDEPENDENT_AMBULATORY_CARE_PROVIDER_SITE_OTHER): Payer: BC Managed Care – PPO | Admitting: Family Medicine

## 2020-11-04 ENCOUNTER — Other Ambulatory Visit (HOSPITAL_COMMUNITY)
Admission: RE | Admit: 2020-11-04 | Discharge: 2020-11-04 | Disposition: A | Discharge status: Home / Self Care | Payer: BC Managed Care – PPO | Source: Ambulatory Visit | Attending: Family Medicine | Admitting: Family Medicine

## 2020-11-04 ENCOUNTER — Encounter: Payer: Self-pay | Admitting: Family Medicine

## 2020-11-04 ENCOUNTER — Other Ambulatory Visit: Payer: Self-pay

## 2020-11-04 VITALS — BP 112/58 | HR 67 | Temp 98.5°F | Ht 63.0 in | Wt 187.8 lb

## 2020-11-04 DIAGNOSIS — E785 Hyperlipidemia, unspecified: Secondary | ICD-10-CM

## 2020-11-04 DIAGNOSIS — E1165 Type 2 diabetes mellitus with hyperglycemia: Secondary | ICD-10-CM

## 2020-11-04 DIAGNOSIS — Z124 Encounter for screening for malignant neoplasm of cervix: Secondary | ICD-10-CM | POA: Insufficient documentation

## 2020-11-04 DIAGNOSIS — E538 Deficiency of other specified B group vitamins: Secondary | ICD-10-CM | POA: Diagnosis not present

## 2020-11-04 DIAGNOSIS — N200 Calculus of kidney: Secondary | ICD-10-CM

## 2020-11-04 DIAGNOSIS — Z1159 Encounter for screening for other viral diseases: Secondary | ICD-10-CM

## 2020-11-04 DIAGNOSIS — R87610 Atypical squamous cells of undetermined significance on cytologic smear of cervix (ASC-US): Secondary | ICD-10-CM | POA: Insufficient documentation

## 2020-11-04 DIAGNOSIS — E559 Vitamin D deficiency, unspecified: Secondary | ICD-10-CM | POA: Diagnosis not present

## 2020-11-04 DIAGNOSIS — Z0000 Encounter for general adult medical examination without abnormal findings: Secondary | ICD-10-CM | POA: Diagnosis not present

## 2020-11-04 DIAGNOSIS — Z113 Encounter for screening for infections with a predominantly sexual mode of transmission: Secondary | ICD-10-CM | POA: Diagnosis not present

## 2020-11-04 DIAGNOSIS — E039 Hypothyroidism, unspecified: Secondary | ICD-10-CM

## 2020-11-04 DIAGNOSIS — I10 Essential (primary) hypertension: Secondary | ICD-10-CM

## 2020-11-04 LAB — CBC WITH DIFFERENTIAL/PLATELET
Basophils Absolute: 0.1 K/uL (ref 0.0–0.1)
Basophils Relative: 0.8 % (ref 0.0–3.0)
Eosinophils Absolute: 0.2 K/uL (ref 0.0–0.7)
Eosinophils Relative: 2 % (ref 0.0–5.0)
HCT: 38.1 % (ref 36.0–46.0)
Hemoglobin: 12.7 g/dL (ref 12.0–15.0)
Lymphocytes Relative: 31.4 % (ref 12.0–46.0)
Lymphs Abs: 2.7 K/uL (ref 0.7–4.0)
MCHC: 33.4 g/dL (ref 30.0–36.0)
MCV: 84.5 fl (ref 78.0–100.0)
Monocytes Absolute: 0.7 K/uL (ref 0.1–1.0)
Monocytes Relative: 7.7 % (ref 3.0–12.0)
Neutro Abs: 5 K/uL (ref 1.4–7.7)
Neutrophils Relative %: 58.1 % (ref 43.0–77.0)
Platelets: 269 K/uL (ref 150.0–400.0)
RBC: 4.51 Mil/uL (ref 3.87–5.11)
RDW: 14.1 % (ref 11.5–15.5)
WBC: 8.5 K/uL (ref 4.0–10.5)

## 2020-11-04 LAB — LIPID PANEL
Cholesterol: 165 mg/dL (ref 0–200)
HDL: 65.5 mg/dL (ref >39.00)
LDL Cholesterol: 88 mg/dL (ref 0–99)
NonHDL: 99.25
Total CHOL/HDL Ratio: 3
Triglycerides: 58 mg/dL (ref 0.0–149.0)
VLDL: 11.6 mg/dL (ref 0.0–40.0)

## 2020-11-04 LAB — COMPREHENSIVE METABOLIC PANEL
ALT: 14 U/L (ref 0–35)
AST: 11 U/L (ref 0–37)
Albumin: 4 g/dL (ref 3.5–5.2)
Alkaline Phosphatase: 84 U/L (ref 39–117)
BUN: 12 mg/dL (ref 6–23)
CO2: 30 mEq/L (ref 19–32)
Calcium: 9.5 mg/dL (ref 8.4–10.5)
Chloride: 103 mEq/L (ref 96–112)
Creatinine, Ser: 0.62 mg/dL (ref 0.40–1.20)
GFR: 95.57 mL/min (ref >60.00)
Glucose, Bld: 87 mg/dL (ref 70–99)
Potassium: 4.4 mEq/L (ref 3.5–5.1)
Sodium: 139 mEq/L (ref 135–145)
Total Bilirubin: 0.5 mg/dL (ref 0.2–1.2)
Total Protein: 7.4 g/dL (ref 6.0–8.3)

## 2020-11-04 LAB — HEMOGLOBIN A1C: Hgb A1c MFr Bld: 7.4 % — ABNORMAL HIGH (ref 4.6–6.5)

## 2020-11-04 LAB — VITAMIN D 25 HYDROXY (VIT D DEFICIENCY, FRACTURES): VITD: 27.31 ng/mL — ABNORMAL LOW (ref 30.00–100.00)

## 2020-11-04 LAB — TSH: TSH: 0.44 u[IU]/mL (ref 0.35–5.50)

## 2020-11-04 LAB — VITAMIN B12: Vitamin B-12: 398 pg/mL (ref 211–911)

## 2020-11-04 NOTE — Progress Notes (Signed)
Subjective:     Maria Hammond is a 60 y.o. female and is here for a comprehensive physical exam.Doing well.  Procedure for renal calculi rescheduled untill September.  Patient trying to increase water, drinking 2-3 bottles per day.  Unsure of last Pap.  Inquires about having B12 and vitamin D rechecked.  Followed by Endo for DM.  Social History   Socioeconomic History   Marital status: Divorced    Spouse name: Not on file   Number of children: Not on file   Years of education: Not on file   Highest education level: Not on file  Occupational History   Not on file  Tobacco Use   Smoking status: Former    Packs/day: 1.00    Years: 30.00    Pack years: 30.00    Types: Cigarettes    Quit date: 04/23/2010    Years since quitting: 10.5   Smokeless tobacco: Never  Vaping Use   Vaping Use: Never used  Substance and Sexual Activity   Alcohol use: Never   Drug use: Never   Sexual activity: Yes    Partners: Male  Other Topics Concern   Not on file  Social History Narrative   New relationship.    In process of divorce (starting 2015- still going 2018). 2 children- boy 57 autism goes to [employer] and works and girl 39 works at the jail in 2016. Son lives with her.  Husband no longer in house. Daughter in [place].       Works at [employer]- 24 years in May 2018.       Hobbies: tv, facebook/internet. Church.       Regular exercise: not at this time   Caffeine use: Diet Mt Dew   Social Determinants of Health   Financial Resource Strain: Not on file  Food Insecurity: Not on file  Transportation Needs: Not on file  Physical Activity: Not on file  Stress: Not on file  Social Connections: Not on file  Intimate Partner Violence: Not on file   Health Maintenance  Topic Date Due   Hepatitis C Screening  Never done   FOOT EXAM  07/17/2017   PAP SMEAR-Modifier  07/12/2018   COVID-19 Vaccine (4 - Booster for Pfizer series) 06/23/2020   OPHTHALMOLOGY EXAM  11/18/2020   INFLUENZA VACCINE   11/22/2020   HEMOGLOBIN A1C  03/05/2021   MAMMOGRAM  10/28/2021   TETANUS/TDAP  10/15/2029   COLONOSCOPY (Pts 45-108yrs Insurance coverage will need to be confirmed)  01/07/2030   PNEUMOCOCCAL POLYSACCHARIDE VACCINE AGE 82-64 HIGH RISK  Completed   HIV Screening  Completed   Zoster Vaccines- Shingrix  Completed   Pneumococcal Vaccine 63-20 Years old  Aged Out   HPV VACCINES  Aged Out    The following portions of the patient's history were reviewed and updated as appropriate: allergies, current medications, past family history, past medical history, past social history, past surgical history, and problem list.  Review of Systems Pertinent items noted in HPI and remainder of comprehensive ROS otherwise negative.   Objective:    BP (!) 112/58 (BP Location: Left Arm, Patient Position: Sitting, Cuff Size: Normal)   Pulse 67   Temp 98.5 F (36.9 C) (Oral)   Ht 5\' 3"  (1.6 m)   Wt 187 lb 12.8 oz (85.2 kg)   LMP 12/20/2011   SpO2 97%   BMI 33.27 kg/m  General appearance: alert, cooperative, and no distress Head: Normocephalic, without obvious abnormality, atraumatic Eyes: conjunctivae/corneas clear. PERRL, EOM's  intact. Fundi benign. Ears: normal TM's and external ear canals both ears Nose: Nares normal. Septum midline. Mucosa normal. No drainage or sinus tenderness. Throat: lips, mucosa, and tongue normal; teeth and gums normal Neck: no adenopathy, no carotid bruit, no JVD, supple, symmetrical, trachea midline, and thyroid not enlarged, symmetric, no tenderness/mass/nodules Lungs: clear to auscultation bilaterally Heart: regular rate and rhythm, S1, S2 normal, no murmur, click, rub or gallop Abdomen: soft, non-tender; bowel sounds normal; no masses,  no organomegaly Pelvic: external genitalia normal, no adnexal masses or tenderness, no cervical motion tenderness, rectovaginal septum normal, uterus normal size, shape, and consistency, vagina normal without discharge, and L labia majora  with a 5 mm firm round cyst without drainage, erythema, edema, tenderness.  Cervix with whitish-yellow homogenous discharge and scant bleeding with obtaining Pap using broom.  No ovarian masses appreciated. Extremities: extremities normal, atraumatic, no cyanosis or edema Pulses: 2+ and symmetric Skin: Skin color, texture, turgor normal. No rashes or lesions Lymph nodes: Cervical, supraclavicular, and axillary nodes normal. Neurologic: Alert and oriented X 3, normal strength and tone. Normal symmetric reflexes. Normal coordination and gait    Assessment:    Healthy female exam.      Plan:    Anticipatory guidance given including wearing seatbelts, smoke detectors in the home, increasing physical activity, increasing p.o. intake of water and vegetables. -will obtain labs -pap done this visit -mammogram up to date.  Done 10/29/2019 -colonoscopy up to date.  Done 01/08/2020 -Immunizations reviewed -PHQ 9 score 2 -GAD 7 0 -given handout -next CPE in 1 yr See After Visit Summary for Counseling Recommendations   Screening for cervical cancer  - Plan: PAP [[place]]  Essential hypertension -Controlled -Continue current meds including lisinopril 5 mg daily - Plan: CMP  Acquired hypothyroidism  -Continue Synthroid 50 mcg daily -Continue follow-up with Endo - Plan: TSH  Hyperlipidemia, unspecified hyperlipidemia type  -Continue lifestyle changes -Continue Lipitor 10 mg - Plan: Lipid panel  Renal calculi -CT stone study 04/13/2020 with large branched calculus in lower pole of right kidney 1.6 x 1.4 cm with several other small calculi noted.  Lower pole calculus on the left kidney 3-4 mm -Patient encouraged to increase p.o. intake of water and fluids -Continue follow-up with urology - Plan: Vitamin D, 25-hydroxy, CMP  Type 2 diabetes mellitus with hyperglycemia, without long-term current use of insulin (HCC) -Continue lifestyle modifications -Continue follow-up with  Endo -Continue metformin XR 1000 mg twice daily, glipizide XL 2.5 mg twice daily - Plan: Hemoglobin A1c  Vitamin D deficiency  - Plan: Vitamin D, 25-hydroxy  Vitamin B12 deficiency  - Plan: Vitamin B12  Encounter for hepatitis C screening test for low risk patient  - Plan: Hep C Antibody  F/u prn  Grier Mitts, MD

## 2020-11-05 ENCOUNTER — Other Ambulatory Visit: Payer: Self-pay | Admitting: Family Medicine

## 2020-11-05 DIAGNOSIS — E559 Vitamin D deficiency, unspecified: Secondary | ICD-10-CM

## 2020-11-05 LAB — HEPATITIS C ANTIBODY
Hepatitis C Ab: NONREACTIVE
SIGNAL TO CUT-OFF: 0.02 (ref <1.00)

## 2020-11-05 MED ORDER — VITAMIN D (ERGOCALCIFEROL) 1.25 MG (50000 UNIT) PO CAPS
50000.0000 [IU] | ORAL_CAPSULE | ORAL | 0 refills | Status: DC
Start: 2020-11-05 — End: 2021-05-09

## 2020-11-10 LAB — CYTOLOGY - PAP
Chlamydia: NEGATIVE
Comment: NEGATIVE
Comment: NEGATIVE
Comment: NEGATIVE
Comment: NORMAL
Diagnosis: UNDETERMINED — AB
High risk HPV: NEGATIVE
Neisseria Gonorrhea: NEGATIVE
Trichomonas: NEGATIVE

## 2020-11-11 ENCOUNTER — Ambulatory Visit: Payer: BC Managed Care – PPO

## 2020-11-18 ENCOUNTER — Encounter: Payer: Self-pay | Admitting: Podiatry

## 2020-11-18 ENCOUNTER — Ambulatory Visit (INDEPENDENT_AMBULATORY_CARE_PROVIDER_SITE_OTHER): Payer: BC Managed Care – PPO

## 2020-11-18 ENCOUNTER — Ambulatory Visit (INDEPENDENT_AMBULATORY_CARE_PROVIDER_SITE_OTHER): Payer: BC Managed Care – PPO | Admitting: Podiatry

## 2020-11-18 ENCOUNTER — Other Ambulatory Visit: Payer: Self-pay

## 2020-11-18 DIAGNOSIS — M778 Other enthesopathies, not elsewhere classified: Secondary | ICD-10-CM

## 2020-11-18 DIAGNOSIS — M79671 Pain in right foot: Secondary | ICD-10-CM

## 2020-11-18 DIAGNOSIS — Z472 Encounter for removal of internal fixation device: Secondary | ICD-10-CM

## 2020-11-18 MED ORDER — TRIAMCINOLONE ACETONIDE 10 MG/ML IJ SUSP
10.0000 mg | Freq: Once | INTRAMUSCULAR | Status: AC
Start: 2020-11-18 — End: 2020-11-18
  Administered 2020-11-18: 10 mg

## 2020-11-18 NOTE — Progress Notes (Signed)
Subjective:   Patient ID: Maria Hammond, female   DOB: 60 y.o.   MRN: [id number]   HPI Patient presents stating that she is has pain that is developed more in her second metatarsal joint and that is been going on now for a little while and she had surgery several years ago.  Patient states the big toe joint overall is doing pretty well but she does get some vague pains underneath   ROS      Objective:  Physical Exam  Neurovascular status intact with inflammation fluid around the second MPJ right painful when pressed and mild discomfort around the first MPJ with range of motion which is adequate based on the previous arthritis of the joint with no crepitus     Assessment:  Probability for inflamed chronic capsule second MPJ right with elongated second digit which may be putting stress on the toe itself     Plan:  H&P reviewed condition and I went ahead today and I did a forefoot block 60 mg like Marcaine mixture around the second MPJ I aspirated the joint getting out a small amount of clear fluid injected quarter cc dexamethasone Kenalog and applied thick padding to reduce pressure along with rigid bottom shoes.  Reappoint to recheck and may require shortening osteotomy with possible pin removal in future  X-rays indicate that the patient does have a moderate elongation of the second metatarsal right second digit right and the first MPJ joint looks good no indication of spur formation

## 2020-11-25 ENCOUNTER — Inpatient Hospital Stay: Admission: RE | Admit: 2020-11-25 | Payer: BC Managed Care – PPO | Source: Ambulatory Visit

## 2020-11-26 ENCOUNTER — Other Ambulatory Visit: Payer: Self-pay

## 2020-11-26 ENCOUNTER — Ambulatory Visit
Admission: RE | Admit: 2020-11-26 | Discharge: 2020-11-26 | Disposition: A | Discharge status: Home / Self Care | Payer: BC Managed Care – PPO | Source: Ambulatory Visit | Attending: Family Medicine | Admitting: Family Medicine

## 2020-11-26 DIAGNOSIS — Z1231 Encounter for screening mammogram for malignant neoplasm of breast: Secondary | ICD-10-CM

## 2020-12-14 ENCOUNTER — Other Ambulatory Visit: Payer: Self-pay | Admitting: Family Medicine

## 2020-12-16 ENCOUNTER — Other Ambulatory Visit: Payer: Self-pay

## 2020-12-16 ENCOUNTER — Ambulatory Visit (INDEPENDENT_AMBULATORY_CARE_PROVIDER_SITE_OTHER): Payer: BC Managed Care – PPO | Admitting: Podiatry

## 2020-12-16 ENCOUNTER — Encounter: Payer: Self-pay | Admitting: Podiatry

## 2020-12-16 DIAGNOSIS — M778 Other enthesopathies, not elsewhere classified: Secondary | ICD-10-CM | POA: Diagnosis not present

## 2020-12-16 DIAGNOSIS — M2011 Hallux valgus (acquired), right foot: Secondary | ICD-10-CM

## 2020-12-16 NOTE — Progress Notes (Signed)
Subjective:   Patient ID: Maria Hammond, female   DOB: 60 y.o.   MRN: [id number]   HPI Patient states her pain has receded quite a bit with only mild pain now upon deep palpation   ROS      Objective:  Physical Exam  Neurovascular status intact with patient's pain significantly reduced and states that she is walking with a better heel toe gait pattern and the inflammation has reduced in the joint that we had worked on     Assessment:  Laboratory capsulitis second MPJ improved with mild abnormal feeling between the big toe right with a feeling of fullness     Plan:  H&P reviewed both conditions do not recommend further treatment for the capsule and for the other I discussed padding or wider shoe gear and its not anything I see that should be a long-term problem.  She is encouraged to call questions concerns which may arise

## 2020-12-30 DIAGNOSIS — N200 Calculus of kidney: Secondary | ICD-10-CM | POA: Diagnosis not present

## 2021-01-06 ENCOUNTER — Other Ambulatory Visit: Payer: Self-pay

## 2021-01-06 ENCOUNTER — Encounter: Payer: Self-pay | Admitting: Internal Medicine

## 2021-01-06 ENCOUNTER — Ambulatory Visit (INDEPENDENT_AMBULATORY_CARE_PROVIDER_SITE_OTHER): Payer: BC Managed Care – PPO | Admitting: Internal Medicine

## 2021-01-06 VITALS — BP 110/68 | HR 79 | Ht 63.0 in | Wt 188.6 lb

## 2021-01-06 DIAGNOSIS — E1165 Type 2 diabetes mellitus with hyperglycemia: Secondary | ICD-10-CM | POA: Diagnosis not present

## 2021-01-06 DIAGNOSIS — E039 Hypothyroidism, unspecified: Secondary | ICD-10-CM | POA: Diagnosis not present

## 2021-01-06 DIAGNOSIS — E785 Hyperlipidemia, unspecified: Secondary | ICD-10-CM

## 2021-01-06 LAB — POCT GLYCOSYLATED HEMOGLOBIN (HGB A1C): Hemoglobin A1C: 6.8 % — AB (ref 4.0–5.6)

## 2021-01-06 NOTE — Patient Instructions (Addendum)
Please continue: - Metformin ER 1000 mg 2x a day - Glipizide ER 2.5 mg in am, before b'fast  +/- 2.5 mg crushed tablet before large dinner (may need to stop after you stop milk)   Please continue Levothyroxine 50 mcg daily.   Take the thyroid hormone every day, with water, at least 30 minutes before breakfast, separated by at least 4 hours from: - acid reflux medications - calcium - iron - multivitamins   STOP MILK!!!  Please return in 3-4 months with your sugar log.

## 2021-01-06 NOTE — Progress Notes (Signed)
Patient ID: Maria Hammond, female   DOB: 1960/06/08, 60 y.o.   MRN: [id number]  This visit occurred during the SARS-CoV-2 public health emergency.  Safety protocols were in place, including screening questions prior to the visit, additional usage of staff PPE, and extensive cleaning of exam room while observing appropriate contact time as indicated for disinfecting solutions.   HPI: Maria Hammond is a 60 y.o.-year-old female, returning for f/u for DM2, dx 2008, non-insulin-dependent, controlled, without long term complications. Last visit 4 months ago.  Interim history: No increased urination, blurry vision, nausea, chest pain. At last visit, she was eating more on the go as she was more busy at work.  Sugars are higher. In the last 2 months, she adjusted her diet, but she tells me that she is drinking up to 3 gallons of milk a week!! She will have a stent placed for kidney stones.  DM2: Reviewed HbA1c levels: Lab Results  Component Value Date   HGBA1C 7.4 (H) 11/04/2020   HGBA1C 7.1 (A) 09/02/2020   HGBA1C 6.9 (A) 04/29/2020   HGBA1C 6.5 (A) 08/27/2019   HGBA1C 6.2 (A) 02/24/2019   HGBA1C 6.5 (A) 11/06/2018   HGBA1C 6.9 (H) 08/12/2018   HGBA1C 6.1 (A) 02/25/2018   HGBA1C 6.6 (A) 10/22/2017   HGBA1C 7.7 (H) 07/19/2017   HGBA1C 7.2 03/13/2017   HGBA1C 7.0 12/11/2016   HGBA1C 7.9 (H) 07/10/2016   HGBA1C 8.1 05/29/2016   HGBA1C 6.5 01/27/2016   HGBA1C 6.3 10/25/2015   HGBA1C 8.7 (H) 07/05/2015   HGBA1C 7.7 05/31/2015   HGBA1C 7.4 02/26/2015   HGBA1C 7.1 11/23/2014  03/13/2017: HbA1c calculated from fructosamine 6.16%  Pt is on a regimen of: - Metformin ER 1000 mg 2x a day - Glipizide ER 2.5 mg in am, before b'fast +/- 2.5 mg crushed tablet before a large dinner We tried Rybelsus 08/2020 - $$$ She could not afford Januvia and Tradjenta. We stopped Amaryl 2 mg in 03/2013 as her sugars were at goal and even had some lows and got lightheaded 4 h after b'fast, while at work  (resolved after having a snack).   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 Highest sugars: 180 .Marland Kitchen. 176 >> 299 (candy) >> 267  Meter: ReliOn  Pt's meals are: - Breakfast (4 am): 3 strips [place] bacon + 1 egg + coffee - splenda and cream - snack: banana, nuts - Lunch: PB sandwich + bag of chips + yoghurt + fruit + diet Mtn Dew - snack: fruit and nuts - Dinner: sauerkraut + sausage She drinks up 3 gallons of milk a week!  She works starting at 4 AM and has to wake up at 2 AM.  She works until approximately 12 PM.  She goes to bed around 8 PM.  -No  CKD, last BUN/creatinine:  Lab Results  Component Value Date   BUN 12 11/04/2020   CREATININE 0.62 11/04/2020  On lisinopril.  -+ HL; last set of lipids: Lab Results  Component Value Date   CHOL 165 11/04/2020   HDL 65.50 11/04/2020   LDLCALC 88 11/04/2020   LDLDIRECT 66.0 07/13/2014   TRIG 58.0 11/04/2020   CHOLHDL 3 11/04/2020  On Lipitor 10.  - last eye exam was on 10/2020: No DR.  Dr Lady Gary.  - no numbness and tingling in her feet. She had Sx R big toe 02/2019. She sees podiatry. She had numbness in fingertips >> resolved after B12 supplementation. She was found to have a B12 of 192 in 09/2019.  Hypothyroidism:  Reviewed her TFTs: Lab Results  Component Value Date   TSH 0.44 11/04/2020   TSH 0.72 04/29/2020   TSH 0.31 (L) 10/16/2019   Pt is on levothyroxine 50 mcg daily (dose decreased 10/2019), taken: -  in am - fasting - at least 30 min from b'fast - no calcium - no iron - no multivitamins - no PPIs - not on Biotin  Pt denies: - feeling nodules in neck - hoarseness - dysphagia - choking - SOB with lying down  ROS: + see HPI Constitutional: no weight gain/no weight loss, no fatigue, no subjective hyperthermia, no subjective hypothermia Eyes: no blurry vision, no xerophthalmia ENT: no sore throat, + see HPI Cardiovascular: no CP/no SOB/no palpitations/no leg swelling Respiratory: no cough/no SOB/no wheezing Gastrointestinal: no N/no V/no D/no C/no acid reflux Musculoskeletal: no muscle aches/no joint aches, + back pain (2/2 kidney stones), + R upper back pain and tingling Skin: no rashes, no hair loss Neurological: no tremors/no numbness/no tingling/no dizziness  I reviewed pt's medications, allergies, PMH, social hx, family hx, and changes were documented in the history of present illness. Otherwise, unchanged from my initial visit note.  Past Medical History:  Diagnosis Date   Arthritis    DIABETES MELLITUS, TYPE II 11/13/2006   History of kidney stones    HYPERLIPIDEMIA 11/13/2006   HYPERTENSION 05/19/2007   HYPOTHYROIDISM 11/13/2006   TOBACCO USE 12/16/2007   Quit 04/23/10     Past Surgical History:  Procedure Laterality Date   CESAREAN SECTION     COLONOSCOPY  11/11/2009   brodie   IR NEPHROSTOMY PLACEMENT RIGHT  10/15/2020   right foot bone spur surgery      TONSILLECTOMY  1970   TRIGGER FINGER RELEASE     around 2012   Social History   Socioeconomic History   Marital status: Divorced    Spouse name: Not on file   Number of children: Not on file   Years of education: Not on file   Highest education level: Not on file  Occupational History   Not on file  Tobacco Use   Smoking status: Former    Packs/day: 1.00    Years: 30.00    Pack years: 30.00    Types: Cigarettes    Quit date: 04/23/2010    Years since quitting: 10.7   Smokeless tobacco:  Never  Vaping Use   Vaping Use: Never used  Substance and Sexual Activity   Alcohol use: Never   Drug use: Never   Sexual activity: Yes    Partners: Male  Other Topics Concern   Not on file  Social History Narrative   New relationship.    In process of divorce (starting 2015- still going 2018). 2 children- boy 78 autism goes to [facility]  and works and girl 41 works at the jail in 2016. Son lives with her.  Husband no longer in house. Daughter in [place].       Works at [employer]- 24 years in May 2018.       Hobbies: tv, facebook/internet. Church.       Regular exercise: not at this time   Caffeine use: Diet Mt Dew   Social Determinants of Health   Financial Resource Strain: Not on file  Food Insecurity: Not on file  Transportation Needs: Not on file  Physical Activity: Not on file  Stress: Not on file  Social Connections: Not on file  Intimate Partner Violence: Not on file   Current Outpatient Medications on File Prior to Visit  Medication Sig Dispense Refill   atorvastatin (LIPITOR) 10 MG tablet Take 1 tablet by mouth once daily 90 tablet 2   blood glucose meter kit and supplies Use once daily as directed to check blood sugar. (FOR ICD-10 E10.9, E11.9). 1 each 0   Cholecalciferol (VITAMIN D-3) 125 MCG (5000 UT) TABS Take 5,000 Units by mouth daily. Soft gel (Patient not taking: Reported on 11/04/2020)     Continuous Blood Gluc Receiver (FREESTYLE LIBRE 2 READER) DEVI 1 each by Does not apply route daily. 1 each 0   Continuous Blood Gluc Sensor (FREESTYLE LIBRE 2 SENSOR) MISC 1 each by Does not apply route every 14 (fourteen) days. 6 each 3   cyanocobalamin 1000 MCG tablet Take 1,000 mcg by mouth daily.     cyclobenzaprine (FLEXERIL) 10 MG tablet Take 1 tablet (10 mg total) by mouth 3 (three) times daily as needed for muscle spasms. (Patient not taking: Reported on 11/04/2020) 60 tablet 0   glipiZIDE (GLUCOTROL XL) 2.5 MG 24 hr tablet TAKE 1 WHOLE TABLET BY MOUTH BEFORE BREAKFAST AND 1  CRUSHED TABLET BY MOUTH BEFORE DINNER (Patient taking differently: Take 2.5 mg by mouth See admin instructions. Take 2.5 mg in the morning and depending on the meal at dinner will take a 2.5 mg crushed with heavy meal) 180 tablet 3   levothyroxine (SYNTHROID) 50 MCG tablet Take 1 tablet by mouth once daily (Patient taking differently: Take 50 mcg by mouth daily before breakfast.) 90 tablet 2   lisinopril (ZESTRIL) 5 MG tablet Take 1 tablet by mouth once daily 90 tablet 2   metFORMIN (GLUCOPHAGE-XR) 500 MG 24 hr tablet TAKE 2 TABLETS BY MOUTH TWICE DAILY WITH A MEAL (Patient taking differently: Take 1,000 mg by mouth 2 (two) times daily.) 360 tablet 3   methylPREDNISolone (MEDROL DOSEPAK) 4 MG TBPK tablet As directed (Patient not taking: No sig reported) 21 tablet 0   Semaglutide (RYBELSUS) 7 MG TABS Take 7 mg by mouth daily. (Patient not taking: Reported on 10/05/2020) 30 tablet 11   Vitamin D, Ergocalciferol, (DRISDOL) 1.25 MG (50000 UNIT) CAPS capsule Take 1 capsule (50,000 Units total) by mouth every 7 (seven) days. 12 capsule 0   No current facility-administered medications on file prior to visit.   No Known Allergies Family History  Problem Relation Age of Onset   Diabetes Mother    Heart disease Father    Kidney disease Father    Prostate cancer Father    Breast cancer Sister        age 71   Diabetes Brother    Diabetes Brother    Colon cancer Neg Hx    Colon polyps Neg Hx    Esophageal cancer Neg Hx    Rectal cancer  Neg Hx    Stomach cancer Neg Hx     PE: BP 110/68 (BP Location: Right Arm, Patient Position: Sitting, Cuff Size: Normal)   Pulse 79   Ht 5' 3"  (1.6 m)   Wt 188 lb 9.6 oz (85.5 kg)   LMP 12/20/2011   SpO2 96%   BMI 33.41 kg/m  Body mass index is 33.41 kg/m.  Wt Readings from Last 3 Encounters:  01/06/21 188 lb 9.6 oz (85.5 kg)  11/04/20 187 lb 12.8 oz (85.2 kg)  10/07/20 185 lb (83.9 kg)   Constitutional: overweight, in NAD Eyes: PERRLA, EOMI, no  exophthalmos ENT: moist mucous membranes, no thyromegaly, no cervical lymphadenopathy Cardiovascular: RRR, No MRG Respiratory: CTA B Gastrointestinal: abdomen soft, NT, ND, BS+ Musculoskeletal: no deformities, strength intact in all 4 Skin: moist, warm, no rashes Neurological: no tremor with outstretched hands, DTR normal in all 4  ASSESSMENT: 1. DM2, non-insulin-dependent, controlled, without long term complications, but with occasional hyperglycemia -The Freestyle libre CGM was not covered by her insurance  2. Hypothyroidism  3. HL  PLAN:  1. Patient with fairly well-controlled type 2 diabetes, on metformin ER (had bloating with IR metformin), sulfonylurea.  We tried a p.o. GLP-1 receptor agonist at last visit, but this was not covered.  At that time, sugars were higher as she relaxed her diet.  She refused injectables.   HbA1c at last visit was higher, at 7.1%.  Since then, 2 months ago, she had another HbA1c which was even higher, at 7.4%.  We will -At this visit, she tells me that she is drinking a huge volume of milk a week, of which some of it is chocolate milk.  We discussed that this needs to stop since this is negatively impacting her health.  She already has kidney stones and has to have a stent placed.  Also, it is possible that she was diagnosed with diabetes due to this habit.  I also advised her about the high risk of cancer is drinking that much milk due to the growth factors in the milk.  I suggested to switch to almond, oat, or soy milk.  She still needs to reduce the quantity, though.  Sugars remain fairly well controlled, but she does have hyperglycemic spikes and occasional blood sugars in the 200s.  For now we will continue the current regimen but I advised him that we may need to stop glipizide when sugars start to improve after - I suggested to:  Patient Instructions  Please continue: - Metformin ER 1000 mg 2x a day - Glipizide ER 2.5 mg in am, before b'fast  +/- 2.5  mg crushed tablet before large dinner - Rybelsus 7 mg daily in the a.m.  Please continue Levothyroxine 50 mcg daily.  Take the thyroid hormone every day, with water, at least 30 minutes before breakfast, separated by at least 4 hours from: - acid reflux medications - calcium - iron - multivitamins  Please return in 3-4 months with your sugar log.   - HbA1c 6.8% today (better) - advised to check sugars at different times of the day - 1x a day, rotating check times - advised for yearly eye exams >> she is UTD - return to clinic in 3-4 months  2. Hypothyroidism - latest thyroid labs reviewed with pt. >> normal: Lab Results  Component Value Date   TSH 0.44 11/04/2020  - she continues on LT4 50 mcg daily - pt feels good on this dose. - we discussed about  taking the thyroid hormone every day, with water, >30 minutes before breakfast, separated by >4 hours from acid reflux medications, calcium, iron, multivitamins. Pt. is taking it correctly.  3. HL -Reviewed latest lipid panel from 10/2020: Fractions at goal: Lab Results  Component Value Date   CHOL 165 11/04/2020   HDL 65.50 11/04/2020   LDLCALC 88 11/04/2020   LDLDIRECT 66.0 07/13/2014   TRIG 58.0 11/04/2020   CHOLHDL 3 11/04/2020  -Continues Lipitor 10 mg daily without side effects  Philemon Kingdom, MD PhD [facility] Endocr[facility]

## 2021-01-10 DIAGNOSIS — N201 Calculus of ureter: Secondary | ICD-10-CM | POA: Diagnosis not present

## 2021-01-10 DIAGNOSIS — N2889 Other specified disorders of kidney and ureter: Secondary | ICD-10-CM | POA: Diagnosis not present

## 2021-01-10 DIAGNOSIS — N200 Calculus of kidney: Secondary | ICD-10-CM | POA: Diagnosis not present

## 2021-01-21 DIAGNOSIS — N200 Calculus of kidney: Secondary | ICD-10-CM | POA: Diagnosis not present

## 2021-01-28 DIAGNOSIS — N200 Calculus of kidney: Secondary | ICD-10-CM | POA: Diagnosis not present

## 2021-01-28 DIAGNOSIS — N23 Unspecified renal colic: Secondary | ICD-10-CM | POA: Diagnosis not present

## 2021-02-28 DIAGNOSIS — N200 Calculus of kidney: Secondary | ICD-10-CM | POA: Diagnosis not present

## 2021-04-04 ENCOUNTER — Telehealth: Payer: Self-pay | Admitting: Internal Medicine

## 2021-04-04 DIAGNOSIS — E039 Hypothyroidism, unspecified: Secondary | ICD-10-CM

## 2021-04-04 MED ORDER — LEVOTHYROXINE SODIUM 50 MCG PO TABS: 50.0000 ug | ORAL_TABLET | Freq: Every day | ORAL | 2 refills | Status: DC

## 2021-04-04 NOTE — Telephone Encounter (Signed)
Rx now sent.

## 2021-04-04 NOTE — Telephone Encounter (Signed)
Pharmacy called concerning the below medication levothyroxine (SYNTHROID) 50 MCG tablet  Please call Pharmacy at  [place], [place]. Phone:  [phone]  Fax:  [phone]

## 2021-04-04 NOTE — Telephone Encounter (Signed)
Pharmacy called concerning the below medication levothyroxine (SYNTHROID) 50 MCG tablet [place], [place]. Phone:  [phone]  Fax:  [phone]    Please call ASAP

## 2021-04-05 ENCOUNTER — Encounter: Payer: Self-pay | Admitting: Orthopaedic Surgery

## 2021-04-05 ENCOUNTER — Ambulatory Visit (INDEPENDENT_AMBULATORY_CARE_PROVIDER_SITE_OTHER): Payer: BC Managed Care – PPO | Admitting: Orthopaedic Surgery

## 2021-04-05 ENCOUNTER — Other Ambulatory Visit: Payer: Self-pay

## 2021-04-05 DIAGNOSIS — S83241A Other tear of medial meniscus, current injury, right knee, initial encounter: Secondary | ICD-10-CM | POA: Diagnosis not present

## 2021-04-05 DIAGNOSIS — M23006 Cystic meniscus, unspecified meniscus, right knee: Secondary | ICD-10-CM

## 2021-04-05 NOTE — Progress Notes (Signed)
Office Visit Note   Patient: Maria Hammond           Date of Birth: December 21, 1960           MRN: [id number] Visit Date: 04/05/2021              Requested by: Billie Ruddy, MD [place],  [place] [id number] PCP: Billie Ruddy, MD   Assessment & Plan: Visit Diagnoses:  1. Acute medial meniscal tear, right, initial encounter   2. Perimeniscal cyst of right knee     Plan: Maria Hammond returns today for continued symptomatic parameniscal cyst of the right knee.  We saw her back in March for this as she decided to live with it.  Unfortunately the pain has gotten worse and she feels like the cyst is gotten bigger.  She is having trouble with ambulation and weightbearing now.  The right knee exam does show that the cyst has slightly enlarged on the medial side of the knee right at the joint line.  I reviewed the MRI again with the patient which does demonstrate radial tear of the medial meniscus at the anterior horn with an associated large parameniscal cyst.  At this point Maria Hammond has elected to move forward with surgical treatment of this.  I explained that we will treat the meniscal tear arthroscopically but if we are unable to decompress the cyst that way then I will have to make a separate incision and remove it in an open manner.  All questions answered.  Maria Hammond will call the patient to schedule surgery.  Follow-Up Instructions: No follow-ups on file.   Orders:  No orders of the defined types were placed in this encounter.  No orders of the defined types were placed in this encounter.     Procedures: No procedures performed   Clinical Data: No additional findings.   Subjective: Chief Complaint  Patient presents with   Right Knee - Pain, Follow-up    HPI  Review of Systems   Objective: Vital Signs: LMP 12/20/2011   Physical Exam  Ortho Exam  Specialty Comments:  No specialty comments available.  Imaging: No results found.   PMFS  History: Patient Active Problem List   Diagnosis Date Noted   Unilateral primary osteoarthritis, left knee 01/16/2018   Trigger middle finger of right hand 07/21/2015   Obesity 01/26/2012   Essential hypertension 05/19/2007   Hypothyroidism 11/13/2006   Type 2 diabetes mellitus with hyperglycemia ([place]) 11/13/2006   Hyperlipidemia 11/13/2006   Past Medical History:  Diagnosis Date   Arthritis    DIABETES MELLITUS, TYPE II 11/13/2006   History of kidney stones    HYPERLIPIDEMIA 11/13/2006   HYPERTENSION 05/19/2007   HYPOTHYROIDISM 11/13/2006   TOBACCO USE 12/16/2007   Quit 04/23/10      Family History  Problem Relation Age of Onset   Diabetes Mother    Heart disease Father    Kidney disease Father    Prostate cancer Father    Breast cancer Sister        age 72   Diabetes Brother    Diabetes Brother    Colon cancer Neg Hx    Colon polyps Neg Hx    Esophageal cancer Neg Hx    Rectal cancer Neg Hx    Stomach cancer Neg Hx     Past Surgical History:  Procedure Laterality Date   CESAREAN SECTION     COLONOSCOPY  11/11/2009   brodie   IR  NEPHROSTOMY PLACEMENT RIGHT  10/15/2020   right foot bone spur surgery      TONSILLECTOMY  1970   TRIGGER FINGER RELEASE     around 2012   Social History   Occupational History   Not on file  Tobacco Use   Smoking status: Former    Packs/day: 1.00    Years: 30.00    Pack years: 30.00    Types: Cigarettes    Quit date: 04/23/2010    Years since quitting: 10.9   Smokeless tobacco: Never  Vaping Use   Vaping Use: Never used  Substance and Sexual Activity   Alcohol use: Never   Drug use: Never   Sexual activity: Yes    Partners: Male

## 2021-04-28 DIAGNOSIS — R1084 Generalized abdominal pain: Secondary | ICD-10-CM | POA: Diagnosis not present

## 2021-04-28 DIAGNOSIS — N139 Obstructive and reflux uropathy, unspecified: Secondary | ICD-10-CM | POA: Diagnosis not present

## 2021-04-29 ENCOUNTER — Telehealth: Payer: Self-pay | Admitting: Family Medicine

## 2021-04-29 NOTE — Telephone Encounter (Signed)
Patient had gone to her Urologist in regards to her previous kidney stones and current back pain That provider informed her all things are good and yes she has a stone.  However for the back pain she would need to see her PCP.  Patient has had the back pain continuously for awhile however per patient it has gotten worse in the last week.

## 2021-05-04 ENCOUNTER — Other Ambulatory Visit: Payer: Self-pay

## 2021-05-04 ENCOUNTER — Ambulatory Visit (INDEPENDENT_AMBULATORY_CARE_PROVIDER_SITE_OTHER): Payer: BC Managed Care – PPO | Admitting: Family Medicine

## 2021-05-04 ENCOUNTER — Encounter: Payer: Self-pay | Admitting: Family Medicine

## 2021-05-04 ENCOUNTER — Ambulatory Visit (INDEPENDENT_AMBULATORY_CARE_PROVIDER_SITE_OTHER): Payer: BC Managed Care – PPO

## 2021-05-04 DIAGNOSIS — M546 Pain in thoracic spine: Secondary | ICD-10-CM | POA: Diagnosis not present

## 2021-05-04 DIAGNOSIS — R202 Paresthesia of skin: Secondary | ICD-10-CM | POA: Diagnosis not present

## 2021-05-04 DIAGNOSIS — R208 Other disturbances of skin sensation: Secondary | ICD-10-CM | POA: Diagnosis not present

## 2021-05-04 DIAGNOSIS — Z87891 Personal history of nicotine dependence: Secondary | ICD-10-CM

## 2021-05-04 LAB — POCT URINALYSIS DIPSTICK
Bilirubin, UA: NEGATIVE
Blood, UA: NEGATIVE
Glucose, UA: NEGATIVE
Ketones, UA: NEGATIVE
Leukocytes, UA: NEGATIVE
Nitrite, UA: NEGATIVE
Protein, UA: NEGATIVE
Spec Grav, UA: 1.01 (ref 1.010–1.025)
Urobilinogen, UA: NEGATIVE E.U./dL — AB
pH, UA: 6 (ref 5.0–8.0)

## 2021-05-04 NOTE — Progress Notes (Signed)
Subjective:    Patient ID: Maria Hammond, female    DOB: [birth year], 61 y.o.   MRN: [id number]  Chief Complaint  Patient presents with   Back Pain    Started about 2 wks ago, not as bad now. Urologist informed her everything is fine, but xray needed. Feels a tingling from jaw line down the neck to shoulder on rt side. Sometimes has tingling in fingers.was taking Ibuprofen but not helping, dr gave shot last week but dd not help either    HPI Patient was seen today for acute concerns.  Pt endorses L mid thoracic back discomfort x 1.5 wks.  Does not recall any injury, rash, or skin lesions in area.  Noted as an irritated feeling that made her want to scratch.  Now sore in the area, like a tooth ache, an annoying feeling, 2-3/10 discomfort.  Patient states her bra even caused irritation so she started using an bra extender.  Ibuprofen, Toradol injection, and heat have not helped.   Had Toradol injection at urology office.  Informed renal calculi were not causing her back pain.  Also notes a tingling sensation, starts in jaw and goes down into R lateral neck and R shoulder.  Pt denies facial edema or drooping.  Endorses history of tobacco use, quit 11 years ago.  Was smoking 15-20 cigarettes/day x 30 years.  Past Medical History:  Diagnosis Date   Arthritis    DIABETES MELLITUS, TYPE II 11/13/2006   History of kidney stones    HYPERLIPIDEMIA 11/13/2006   HYPERTENSION 05/19/2007   HYPOTHYROIDISM 11/13/2006   TOBACCO USE 12/16/2007   Quit 04/23/10      No Known Allergies  ROS General: Denies fever, chills, night sweats, changes in weight, changes in appetite HEENT: Denies headaches, ear pain, changes in vision, rhinorrhea, sore throat CV: Denies CP, palpitations, SOB, orthopnea Pulm: Denies SOB, cough, wheezing GI: Denies abdominal pain, nausea, vomiting, diarrhea, constipation GU: Denies dysuria, hematuria, frequency, vaginal discharge Msk: Denies muscle cramps, joint pains Neuro: Denies  weakness  +numbness and tingling of lateral L back and R side of neck into shoulder Skin: Denies rashes, bruising   Psych: Denies depression, anxiety, hallucinations     Objective:    Blood pressure (!) (P) 126/56, pulse (P) 82, temperature (P) 98.8 F (37.1 C), temperature source (P) Oral, weight (P) 182 lb (82.6 kg), last menstrual period 12/20/2011, SpO2 (P) 97 %.  Gen. Pleasant, well-nourished, in no distress, normal affect   HEENT: /AT, face symmetric, conjunctiva clear, no scleral icterus, PERRLA, EOMI, nares patent without drainage Lungs: no accessory muscle use, CTAB, no wheezes or rales Cardiovascular: RRR, no m/r/g, no peripheral edema Musculoskeletal: TTP of left thoracic paraspinal muscles,  no TTP of cervical, thoracic, lumbar spine.  Slight elevation in right shoulder compared to left.  No TTP of right neck or shoulder.  No symptoms with axial loading.  No deformities, no cyanosis or clubbing, normal tone Neuro:  A&Ox3, CN II-XII intact, normal gait.  Slightly increased tension in trapezius/right shoulder. Skin:  Warm, no lesions/ rash on back or neck.   Wt Readings from Last 3 Encounters:  05/04/21 (P) 182 lb (82.6 kg)  01/06/21 188 lb 9.6 oz (85.5 kg)  11/04/20 187 lb 12.8 oz (85.2 kg)    Lab Results  Component Value Date   WBC 8.5 11/04/2020   HGB 12.7 11/04/2020   HCT 38.1 11/04/2020   PLT 269.0 11/04/2020   GLUCOSE 87 11/04/2020   CHOL 165 11/04/2020  TRIG 58.0 11/04/2020   HDL 65.50 11/04/2020   LDLDIRECT 66.0 07/13/2014   LDLCALC 88 11/04/2020   ALT 14 11/04/2020   AST 11 11/04/2020   NA 139 11/04/2020   K 4.4 11/04/2020   CL 103 11/04/2020   CREATININE 0.62 11/04/2020   BUN 12 11/04/2020   CO2 30 11/04/2020   TSH 0.44 11/04/2020   INR 1.0 10/15/2020   HGBA1C 6.8 (A) 01/06/2021   MICROALBUR 1.4 11/06/2018    Assessment/Plan:  Skin soreness -Mid left back -Discussed possible causes.  Consider shingles though no lesions currently present.   Unclear if bra with digging into skin causing discomfort.  Continue using bra extender. -Treatment of symptoms with topical analgesics, heat. - Plan: Vitamin B12, DG Chest 2 View  Paresthesia  -Consider vitamin deficiency versus diabetic neuropathy - Plan: Vitamin B12, Vitamin D, 25-hydroxy, CBC with Differential/Platelet, Basic metabolic panel, TSH  Former smoker  -Quit 11 years ago -Smoke 15-20 cigarettes x 30 years -Continue cessation encouraged - Plan: DG Chest 2 View   CXR negative  Left-sided thoracic back pain, chronicity unspecified -Plan: POC urinalysis   -UA negative  F/u as needed  Grier Mitts, MD

## 2021-05-05 LAB — CBC WITH DIFFERENTIAL/PLATELET
Basophils Absolute: 0.1 10*3/uL (ref 0.0–0.1)
Basophils Relative: 0.8 % (ref 0.0–3.0)
Eosinophils Absolute: 0.2 10*3/uL (ref 0.0–0.7)
Eosinophils Relative: 2.3 % (ref 0.0–5.0)
HCT: 39.8 % (ref 36.0–46.0)
Hemoglobin: 12.8 g/dL (ref 12.0–15.0)
Lymphocytes Relative: 36.3 % (ref 12.0–46.0)
Lymphs Abs: 3.4 10*3/uL (ref 0.7–4.0)
MCHC: 32.2 g/dL (ref 30.0–36.0)
MCV: 83.7 fl (ref 78.0–100.0)
Monocytes Absolute: 0.7 10*3/uL (ref 0.1–1.0)
Monocytes Relative: 7.5 % (ref 3.0–12.0)
Neutro Abs: 5 10*3/uL (ref 1.4–7.7)
Neutrophils Relative %: 53.1 % (ref 43.0–77.0)
Platelets: 280 10*3/uL (ref 150.0–400.0)
RBC: 4.76 Mil/uL (ref 3.87–5.11)
RDW: 14.4 % (ref 11.5–15.5)
WBC: 9.4 10*3/uL (ref 4.0–10.5)

## 2021-05-05 LAB — BASIC METABOLIC PANEL
BUN: 11 mg/dL (ref 6–23)
CO2: 29 mEq/L (ref 19–32)
Calcium: 9.4 mg/dL (ref 8.4–10.5)
Chloride: 102 mEq/L (ref 96–112)
Creatinine, Ser: 0.67 mg/dL (ref 0.40–1.20)
GFR: 93.47 mL/min (ref >60.00)
Glucose, Bld: 139 mg/dL — ABNORMAL HIGH (ref 70–99)
Potassium: 4.2 mEq/L (ref 3.5–5.1)
Sodium: 138 mEq/L (ref 135–145)

## 2021-05-05 LAB — VITAMIN D 25 HYDROXY (VIT D DEFICIENCY, FRACTURES): VITD: 29.25 ng/mL — ABNORMAL LOW (ref 30.00–100.00)

## 2021-05-05 LAB — VITAMIN B12: Vitamin B-12: 738 pg/mL (ref 211–911)

## 2021-05-05 LAB — TSH: TSH: 1.24 u[IU]/mL (ref 0.35–5.50)

## 2021-05-09 ENCOUNTER — Other Ambulatory Visit: Payer: Self-pay | Admitting: Family Medicine

## 2021-05-09 DIAGNOSIS — E559 Vitamin D deficiency, unspecified: Secondary | ICD-10-CM

## 2021-05-09 MED ORDER — VITAMIN D (ERGOCALCIFEROL) 1.25 MG (50000 UNIT) PO CAPS: 50000.0000 [IU] | ORAL_CAPSULE | ORAL | 0 refills | Status: DC

## 2021-05-12 ENCOUNTER — Ambulatory Visit (INDEPENDENT_AMBULATORY_CARE_PROVIDER_SITE_OTHER): Payer: BC Managed Care – PPO | Admitting: Internal Medicine

## 2021-05-12 ENCOUNTER — Other Ambulatory Visit: Payer: Self-pay

## 2021-05-12 ENCOUNTER — Encounter: Payer: Self-pay | Admitting: Internal Medicine

## 2021-05-12 VITALS — BP 118/64 | HR 69 | Ht 63.0 in | Wt 178.8 lb

## 2021-05-12 DIAGNOSIS — E785 Hyperlipidemia, unspecified: Secondary | ICD-10-CM

## 2021-05-12 DIAGNOSIS — E039 Hypothyroidism, unspecified: Secondary | ICD-10-CM | POA: Diagnosis not present

## 2021-05-12 DIAGNOSIS — E1165 Type 2 diabetes mellitus with hyperglycemia: Secondary | ICD-10-CM | POA: Diagnosis not present

## 2021-05-12 LAB — POCT GLYCOSYLATED HEMOGLOBIN (HGB A1C): Hemoglobin A1C: 6.8 % — AB (ref 4.0–5.6)

## 2021-05-12 MED ORDER — FREESTYLE LIBRE 3 SENSOR MISC: 1.0000 | 3 refills | Status: DC

## 2021-05-12 MED ORDER — RYBELSUS 7 MG PO TABS: 7.0000 mg | ORAL_TABLET | Freq: Every day | ORAL | 3 refills | Status: DC

## 2021-05-12 NOTE — Patient Instructions (Addendum)
Please continue: - Metformin ER 1000 mg 2x a day - Glipizide ER 2.5 mg in am, before b'fast  +/- 2.5 mg crushed tablet before large dinner  Try to start: - Rybelsus 3.5 mg daily in the a.m. x 1 week, then increase to 7 mg daily  Please continue Levothyroxine 50 mcg daily.  Take the thyroid hormone every day, with water, at least 30 minutes before breakfast, separated by at least 4 hours from: - acid reflux medications - calcium - iron - multivitamins  Please return in 3-4 months with your sugar log.

## 2021-05-12 NOTE — Progress Notes (Signed)
Patient ID: Maria Hammond, female   DOB: [birth year], 61 y.o.   MRN: [id number]  This visit occurred during the SARS-CoV-2 public health emergency.  Safety protocols were in place, including screening questions prior to the visit, additional usage of staff PPE, and extensive cleaning of exam room while observing appropriate contact time as indicated for disinfecting solutions.   HPI: Maria Hammond is a 61 y.o.-year-old female, returning for f/u for DM2, dx 2008, non-insulin-dependent, controlled, without long term complications. Last visit 4 months ago.  Interim history: No increased urination, blurry vision, nausea, chest pain. At last visit she was drinking a lot of milk and I strongly advised her to stop. She now drinks 1-2 cups a day. She has back and knee pain. She also has 1 kidney stone now.  DM2: Reviewed HbA1c levels: Lab Results  Component Value Date   HGBA1C 6.8 (A) 01/06/2021   HGBA1C 7.4 (H) 11/04/2020   HGBA1C 7.1 (A) 09/02/2020   HGBA1C 6.9 (A) 04/29/2020   HGBA1C 6.5 (A) 08/27/2019   HGBA1C 6.2 (A) 02/24/2019   HGBA1C 6.5 (A) 11/06/2018   HGBA1C 6.9 (H) 08/12/2018   HGBA1C 6.1 (A) 02/25/2018   HGBA1C 6.6 (A) 10/22/2017   HGBA1C 7.7 (H) 07/19/2017   HGBA1C 7.2 03/13/2017   HGBA1C 7.0 12/11/2016   HGBA1C 7.9 (H) 07/10/2016   HGBA1C 8.1 05/29/2016   HGBA1C 6.5 01/27/2016   HGBA1C 6.3 10/25/2015   HGBA1C 8.7 (H) 07/05/2015   HGBA1C 7.7 05/31/2015   HGBA1C 7.4 02/26/2015  03/13/2017: HbA1c calculated from fructosamine 6.16%  Pt is on a regimen of: - Metformin ER 1000 mg 2x a day - Glipizide ER 2.5 mg in am, before b'fast +/- 2.5 mg crushed tablet before a large dinner We tried Rybelsus 08/2020 - $$$. She could not afford Januvia and Tradjenta. We stopped Amaryl 2 mg in 03/2013 as her sugars were at goal and even had some lows and got lightheaded 4 h after b'fast, while at work (resolved after having a snack).   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 Highest sugars: 299 (candy) >> 267 >> 222.  Meter: ReliOn  Pt's meals are: - Breakfast (4 am): 3 strips [place] bacon + 1 egg + coffee - splenda and cream - snack: banana, nuts - Lunch: PB sandwich + bag of chips + yoghurt + fruit + diet Mtn Dew - snack: fruit and nuts - Dinner: sauerkraut + sausage She was drinking up 3 gallons of milk a week! >> reduced.  She works starting at 4 AM and has to wake up at 2 AM.  She works until approximately 12 PM.  She goes to bed around 8 PM.  -No CKD, last BUN/creatinine:  Lab Results  Component Value Date   BUN 11 05/04/2021   CREATININE 0.67 05/04/2021  On lisinopril.  -+ HL; last set of lipids: Lab Results  Component Value Date   CHOL 165 11/04/2020   HDL 65.50 11/04/2020   LDLCALC 88 11/04/2020   LDLDIRECT 66.0 07/13/2014   TRIG 58.0 11/04/2020   CHOLHDL 3 11/04/2020  On Lipitor 10.  - last eye exam was on 10/2020: No DR.  Dr Lady Gary.  - no numbness and tingling in her feet. She had Sx R big toe 02/2019. She sees podiatry. She had numbness in fingertips >> resolved after B12 supplementation. She was found to have a B12 of 192 in 09/2019.  Hypothyroidism:  Reviewed her TFTs: Lab Results  Component Value Date   TSH 1.24 05/04/2021   TSH 0.44 11/04/2020   TSH 0.72 04/29/2020   Pt is on levothyroxine 50 mcg daily (dose decreased 10/2019), taken: - in am - fasting - at least 30 min from b'fast - no calcium - no iron - no multivitamins -  no PPIs - not on Biotin  Pt denies: - feeling nodules in neck - hoarseness - dysphagia - choking - SOB with lying down  ROS: + see HPI Musculoskeletal: no muscle aches/no joint aches, + back pain (2/2 kidney stones), + R upper back pain and tingling  I reviewed pt's medications, allergies, PMH, social hx, family hx, and changes were documented in the history of present illness. Otherwise, unchanged from my initial visit note.  Past Medical History:  Diagnosis Date   Arthritis    DIABETES MELLITUS, TYPE II 11/13/2006   History of kidney stones    HYPERLIPIDEMIA 11/13/2006   HYPERTENSION 05/19/2007   HYPOTHYROIDISM 11/13/2006   TOBACCO USE 12/16/2007   Quit 04/23/10     Past Surgical History:  Procedure Laterality Date   CESAREAN SECTION     COLONOSCOPY  11/11/2009   brodie   IR NEPHROSTOMY PLACEMENT RIGHT  10/15/2020   right foot bone spur surgery      TONSILLECTOMY  1970   TRIGGER FINGER RELEASE     around 2012   Social History   Socioeconomic History   Marital status: Divorced    Spouse name: Not on file   Number of children: Not on file   Years of education: Not on file   Highest education level: Not on file  Occupational History   Not on file  Tobacco Use   Smoking status: Former    Packs/day: 1.00    Years: 30.00    Pack years: 30.00    Types: Cigarettes    Quit date: 04/23/2010    Years since quitting: 11.0   Smokeless tobacco: Never  Vaping Use   Vaping Use: Never used  Substance and Sexual Activity   Alcohol use: Never   Drug use: Never   Sexual activity: Yes    Partners: Male  Other Topics Concern   Not on file  Social History Narrative   New relationship.    In process of divorce (starting 2015- still going 2018). 2 children- boy 48 autism goes to [facility] and works and girl 39 works at the jail in 2016. Son lives with her.  Husband no longer in house. Daughter in [place].       Works at [employer]- 24 years in May 2018.       Hobbies: tv,  facebook/internet. Church.       Regular exercise: not at this time   Caffeine use: Diet Mt Dew   Social Determinants of Health   Financial Resource Strain: Not on file  Food Insecurity: Not on file  Transportation Needs: Not on file  Physical Activity: Not on file  Stress: Not on file  Social Connections: Not on file  Intimate Partner Violence: Not on file   Current Outpatient Medications on File Prior to Visit  Medication Sig Dispense Refill   atorvastatin (LIPITOR) 10 MG tablet Take 1 tablet by mouth once daily 90 tablet 2   Continuous Blood Gluc Sensor (FREESTYLE LIBRE 2 SENSOR) MISC 1 each by Does not apply route every 14 (fourteen) days. (Patient not taking: Reported on 05/04/2021) 6 each 3   Cyanocobalamin (VITAMIN B-12 PO) Take by mouth.     glipiZIDE (GLUCOTROL XL) 2.5 MG 24 hr tablet TAKE 1 WHOLE TABLET BY MOUTH BEFORE BREAKFAST AND 1 CRUSHED TABLET BY MOUTH BEFORE DINNER (Patient taking differently: Take 2.5 mg by mouth See admin instructions. Take 2.5 mg in the morning and depending on the meal at dinner will take a 2.5 mg crushed with heavy meal) 180 tablet 3   levothyroxine (SYNTHROID) 50 MCG tablet Take 1 tablet (50 mcg total) by mouth daily. 90 tablet 2   lisinopril (ZESTRIL) 5 MG tablet Take 1 tablet by mouth once daily 90 tablet 2   metFORMIN (GLUCOPHAGE-XR) 500 MG 24 hr tablet TAKE 2 TABLETS BY MOUTH TWICE DAILY WITH A MEAL (Patient taking differently: Take 1,000 mg by mouth 2 (two) times daily.) 360 tablet 3   methylPREDNISolone (MEDROL DOSEPAK) 4 MG TBPK tablet As directed (Patient not taking: Reported on 10/05/2020) 21 tablet 0   VITAMIN D PO Take by mouth.     Vitamin D, Ergocalciferol, (DRISDOL) 1.25 MG (50000 UNIT) CAPS capsule Take 1 capsule (50,000 Units total) by mouth every 7 (seven) days. 12 capsule 0   No current facility-administered medications on file prior to visit.   No Known Allergies Family History  Problem Relation Age of Onset   Diabetes Mother     Heart disease Father    Kidney disease Father    Prostate cancer Father    Breast cancer Sister        age 9   Diabetes Brother    Diabetes Brother    Colon cancer Neg Hx    Colon polyps Neg Hx    Esophageal cancer Neg Hx    Rectal cancer Neg Hx    Stomach cancer Neg Hx     PE: BP 118/64 (BP Location: Left Arm, Patient Position: Sitting, Cuff Size: Normal)    Pulse 69    Ht 5\' 3"  (1.6 m)    Wt 178 lb 12.8 oz (81.1 kg)    LMP 12/20/2011    SpO2 96%    BMI 31.67 kg/m    Wt Readings from Last 3 Encounters:  05/12/21 178 lb 12.8 oz (81.1 kg)  05/04/21 (P) 182 lb (82.6 kg)  01/06/21 188 lb 9.6 oz (85.5 kg)   Constitutional: overweight, in NAD Eyes: PERRLA, EOMI, no exophthalmos ENT: moist mucous membranes, no thyromegaly, no cervical lymphadenopathy Cardiovascular: RRR, No MRG Respiratory: CTA B Musculoskeletal: no deformities, strength intact in all 4 Skin: moist, warm, no rashes Neurological: no tremor with outstretched hands, DTR normal in all 4  Diabetic Foot Exam - Simple   Simple Foot Form Diabetic Foot exam was performed with the following findings: Yes 05/12/2021 10:23 AM  Visual Inspection No deformities, no ulcerations, no other skin breakdown bilaterally: Yes Sensation Testing Intact to touch and monofilament testing bilaterally: Yes Pulse Check Posterior Tibialis and Dorsalis pulse intact bilaterally: Yes  Comments    ASSESSMENT: 1. DM2, non-insulin-dependent, controlled, without long term complications, but with occasional hyperglycemia -The Freestyle libre CGM was not covered by her insurance  2. Hypothyroidism  3. HL  PLAN:  1. Patient with generally well-controlled type 2 diabetes, on metformin ER (had bloating with IR metformin), sulfonylurea.  We tried to start a p.o. GLP-1 receptor agonist but this was too expensive. -At last visit, she was telling me that she was drinking a huge volume of milk every week, at which time was chocolate milk.  We  discussed that she absolutely needs to stop this practice since this was negatively impacting her health.  She already had kidney stones and had to have a stent placed.  It is possible that she was also diagnosed with diabetes due to this habit.  I also advised her that the risk of cancer is much higher if she drinks this much milk.  I advised her to switch to almond or other nondairy milk.  At that time, sugars were fairly well controlled, with hyperglycemic spikes and occasional values in the 200s.  We did not change her antidiabetic regimen at that time.  I did advise her that she may need to stop glipizide when sugars improved.  HbA1c was 6.8%, slightly better. - at today's visit, she tells me she cut down on the amount of milk she is drinking  - now only 1 to 2 cups a day.  She does not have many sugar checks in the last 2 months, but the ones that she checks are mostly still above target.  We tried to start Rybelsus last year but she could not afford it.  However, she feels that she may be able to get it now through the [place] mail order pharmacy. I sent the prescription to her and advised her to start at a low dose and increase it as tolerated.  Discussed about possible side effects.  I also tried to send a prescription for the freestyle libre 3 CGM, in hopes that this will be covered for her. - I suggested to:  Patient Instructions  Please continue: - Metformin ER 1000 mg 2x a day - Glipizide ER 2.5 mg in am, before b'fast  +/- 2.5 mg crushed tablet before large dinner  Try to start: - Rybelsus 3.5 mg daily in the a.m. x 1 week, then increase to 7 mg daily  Please continue Levothyroxine 50 mcg daily.  Take the thyroid hormone every day, with water, at least 30 minutes before breakfast, separated by at least 4 hours from: - acid reflux medications - calcium - iron - multivitamins  Please return in 3-4 months with your sugar log.   - we checked her HbA1c: 6.8% (stable) - advised to check  sugars at different times of the day - 1x a day, rotating check times - advised for yearly eye exams >> she is UTD - return to clinic in 3-4 months  2. Hypothyroidism - latest thyroid labs reviewed with pt. >> normal: Lab Results  Component Value Date   TSH 1.24 05/04/2021  - she continues on LT4 50 mcg daily - pt feels good on this dose. - we discussed about taking the thyroid hormone every day, with water, >30 minutes before breakfast, separated by >4 hours from acid reflux medications, calcium, iron, multivitamins. Pt. is taking it correctly.  3. HL -Reviewed latest lipid panel from 10/2020: fractions at goal: Lab Results  Component Value Date   CHOL 165 11/04/2020  HDL 65.50 11/04/2020   LDLCALC 88 11/04/2020   LDLDIRECT 66.0 07/13/2014   TRIG 58.0 11/04/2020   CHOLHDL 3 11/04/2020  -Continues Lipitor 10 mg daily without side effects  Philemon Kingdom, MD PhD [facility]

## 2021-06-06 ENCOUNTER — Other Ambulatory Visit: Payer: Self-pay | Admitting: Physician Assistant

## 2021-06-06 MED ORDER — HYDROCODONE-ACETAMINOPHEN 5-325 MG PO TABS: 1.0000 | ORAL_TABLET | Freq: Three times a day (TID) | ORAL | 0 refills | Status: DC | PRN

## 2021-06-06 MED ORDER — ONDANSETRON HCL 4 MG PO TABS: 4.0000 mg | ORAL_TABLET | Freq: Three times a day (TID) | ORAL | 0 refills | Status: DC | PRN

## 2021-06-09 ENCOUNTER — Encounter: Payer: Self-pay | Admitting: Orthopaedic Surgery

## 2021-06-09 DIAGNOSIS — S83241A Other tear of medial meniscus, current injury, right knee, initial encounter: Secondary | ICD-10-CM | POA: Diagnosis not present

## 2021-06-09 DIAGNOSIS — Y999 Unspecified external cause status: Secondary | ICD-10-CM | POA: Diagnosis not present

## 2021-06-09 DIAGNOSIS — X58XXXA Exposure to other specified factors, initial encounter: Secondary | ICD-10-CM | POA: Diagnosis not present

## 2021-06-09 DIAGNOSIS — M23000 Cystic meniscus, unspecified lateral meniscus, right knee: Secondary | ICD-10-CM | POA: Diagnosis not present

## 2021-06-09 DIAGNOSIS — S83231A Complex tear of medial meniscus, current injury, right knee, initial encounter: Secondary | ICD-10-CM | POA: Diagnosis not present

## 2021-06-09 DIAGNOSIS — M23006 Cystic meniscus, unspecified meniscus, right knee: Secondary | ICD-10-CM | POA: Insufficient documentation

## 2021-06-09 DIAGNOSIS — M67461 Ganglion, right knee: Secondary | ICD-10-CM | POA: Diagnosis not present

## 2021-06-09 DIAGNOSIS — M659 Synovitis and tenosynovitis, unspecified: Secondary | ICD-10-CM | POA: Diagnosis not present

## 2021-06-16 ENCOUNTER — Encounter: Payer: Self-pay | Admitting: Orthopaedic Surgery

## 2021-06-16 ENCOUNTER — Other Ambulatory Visit: Payer: Self-pay

## 2021-06-16 ENCOUNTER — Ambulatory Visit (INDEPENDENT_AMBULATORY_CARE_PROVIDER_SITE_OTHER): Payer: BC Managed Care – PPO | Admitting: Orthopaedic Surgery

## 2021-06-16 ENCOUNTER — Telehealth: Payer: Self-pay | Admitting: Orthopaedic Surgery

## 2021-06-16 DIAGNOSIS — S83241A Other tear of medial meniscus, current injury, right knee, initial encounter: Secondary | ICD-10-CM

## 2021-06-16 DIAGNOSIS — M23006 Cystic meniscus, unspecified meniscus, right knee: Secondary | ICD-10-CM

## 2021-06-16 NOTE — Telephone Encounter (Signed)
[employer] forms received. To [employer]. 

## 2021-06-16 NOTE — Progress Notes (Signed)
° °  Post-Op Visit Note   Patient: Maria Hammond           Date of Birth: 04-29-1960           MRN: [id number] Visit Date: 06/16/2021 PCP: Billie Ruddy, MD   Assessment & Plan:  Chief Complaint:  Chief Complaint  Patient presents with   Right Knee - Follow-up    Right knee arthroscopy 06/09/2021   Visit Diagnoses:  1. Acute medial meniscal tear, right, initial encounter   2. Perimeniscal cyst of right knee     Plan: [name] is 1 week status post right knee scope partial medial meniscectomy parameniscal cyst removal.  Overall doing well.  She has not required any pain medications.  Examination of the right knee shows fully healed surgical incisions.  Moderate swelling and effusion.  Range of motion is adequate.  Sutures removed and Steri-Strips applied.  She will continue to increase activity as tolerated.  Home exercises provided today.  Questions encouraged and answered.  Recheck in a month.  Follow-Up Instructions: Return in about 4 weeks (around 07/14/2021).   Orders:  No orders of the defined types were placed in this encounter.  No orders of the defined types were placed in this encounter.   Imaging: No results found.  PMFS History: Patient Active Problem List   Diagnosis Date Noted   Perimeniscal cyst of right knee 06/09/2021   Acute medial meniscus tear, right, initial encounter 06/09/2021   Unilateral primary osteoarthritis, left knee 01/16/2018   Trigger middle finger of right hand 07/21/2015   Obesity 01/26/2012   Essential hypertension 05/19/2007   Hypothyroidism 11/13/2006   Type 2 diabetes mellitus with hyperglycemia ([place]) 11/13/2006   Hyperlipidemia 11/13/2006   Past Medical History:  Diagnosis Date   Arthritis    DIABETES MELLITUS, TYPE II 11/13/2006   History of kidney stones    HYPERLIPIDEMIA 11/13/2006   HYPERTENSION 05/19/2007   HYPOTHYROIDISM 11/13/2006   TOBACCO USE 12/16/2007   Quit 04/23/10      Family History  Problem Relation Age  of Onset   Diabetes Mother    Heart disease Father    Kidney disease Father    Prostate cancer Father    Breast cancer Sister        age 29   Diabetes Brother    Diabetes Brother    Colon cancer Neg Hx    Colon polyps Neg Hx    Esophageal cancer Neg Hx    Rectal cancer Neg Hx    Stomach cancer Neg Hx     Past Surgical History:  Procedure Laterality Date   CESAREAN SECTION     COLONOSCOPY  11/11/2009   brodie   IR NEPHROSTOMY PLACEMENT RIGHT  10/15/2020   right foot bone spur surgery      TONSILLECTOMY  1970   TRIGGER FINGER RELEASE     around 2012   Social History   Occupational History   Not on file  Tobacco Use   Smoking status: Former    Packs/day: 1.00    Years: 30.00    Pack years: 30.00    Types: Cigarettes    Quit date: 04/23/2010    Years since quitting: 11.1   Smokeless tobacco: Never  Vaping Use   Vaping Use: Never used  Substance and Sexual Activity   Alcohol use: Never   Drug use: Never   Sexual activity: Yes    Partners: Male

## 2021-06-17 ENCOUNTER — Telehealth: Payer: Self-pay | Admitting: Orthopaedic Surgery

## 2021-06-17 NOTE — Telephone Encounter (Signed)
Received $25.00 cash and medical records release form/Forwarding to [employer] today 

## 2021-07-14 ENCOUNTER — Other Ambulatory Visit: Payer: Self-pay

## 2021-07-14 ENCOUNTER — Encounter: Payer: Self-pay | Admitting: Physician Assistant

## 2021-07-14 ENCOUNTER — Ambulatory Visit (INDEPENDENT_AMBULATORY_CARE_PROVIDER_SITE_OTHER): Payer: BC Managed Care – PPO | Admitting: Physician Assistant

## 2021-07-14 DIAGNOSIS — Z9889 Other specified postprocedural states: Secondary | ICD-10-CM

## 2021-07-14 NOTE — Progress Notes (Signed)
? ?Post-Op Visit Note ?  ?Patient: Maria Hammond           ?Date of Birth: July 18, 1960           ?MRN: [id number] ?Visit Date: 07/14/2021 ?PCP: Billie Ruddy, MD ? ? ?Assessment & Plan: ? ?Chief Complaint:  ?Chief Complaint  ?Patient presents with  ? Right Knee - Pain  ? ?Visit Diagnoses:  ?1. S/P right knee arthroscopy   ? ? ?Plan: Patient is a pleasant 61 year old female who comes in today 5 weeks status post right knee arthroscopic debridement medial meniscus and parameniscal cyst removal, date of surgery 06/09/2021.  She has minimal to no pain.  She still has slight weakness with stair climbing but this continues to improve.  She has been working on her home exercise program.  Examination of the right knee reveals fully healed surgical portals without complication.  Range of motion 0 to 120 degrees.  She is neurovascular intact distally.  At this point, she will continue with her home exercise program.  Follow-up with [place] as needed.  Call with concerns or questions. ? ?Follow-Up Instructions: Return if symptoms worsen or fail to improve.  ? ?Orders:  ?No orders of the defined types were placed in this encounter. ? ?No orders of the defined types were placed in this encounter. ? ? ?Imaging: ?No new imaging ? ?PMFS History: ?Patient Active Problem List  ? Diagnosis Date Noted  ? Perimeniscal cyst of right knee 06/09/2021  ? Acute medial meniscus tear, right, initial encounter 06/09/2021  ? Unilateral primary osteoarthritis, left knee 01/16/2018  ? Trigger middle finger of right hand 07/21/2015  ? Obesity 01/26/2012  ? Essential hypertension 05/19/2007  ? Hypothyroidism 11/13/2006  ? Type 2 diabetes mellitus with hyperglycemia ([place]) 11/13/2006  ? Hyperlipidemia 11/13/2006  ? ?Past Medical History:  ?Diagnosis Date  ? Arthritis   ? DIABETES MELLITUS, TYPE II 11/13/2006  ? History of kidney stones   ? HYPERLIPIDEMIA 11/13/2006  ? HYPERTENSION 05/19/2007  ? HYPOTHYROIDISM 11/13/2006  ? TOBACCO USE 12/16/2007  ? Quit  04/23/10    ?  ?Family History  ?Problem Relation Age of Onset  ? Diabetes Mother   ? Heart disease Father   ? Kidney disease Father   ? Prostate cancer Father   ? Breast cancer Sister   ?     age 31  ? Diabetes Brother   ? Diabetes Brother   ? Colon cancer Neg Hx   ? Colon polyps Neg Hx   ? Esophageal cancer Neg Hx   ? Rectal cancer Neg Hx   ? Stomach cancer Neg Hx   ?  ?Past Surgical History:  ?Procedure Laterality Date  ? CESAREAN SECTION    ? COLONOSCOPY  11/11/2009  ? brodie  ? IR NEPHROSTOMY PLACEMENT RIGHT  10/15/2020  ? right foot bone spur surgery     ? TONSILLECTOMY  1970  ? TRIGGER FINGER RELEASE    ? around 2012  ? ?Social History  ? ?Occupational History  ? Not on file  ?Tobacco Use  ? Smoking status: Former  ?  Packs/day: 1.00  ?  Years: 30.00  ?  Pack years: 30.00  ?  Types: Cigarettes  ?  Quit date: 04/23/2010  ?  Years since quitting: 11.2  ? Smokeless tobacco: Never  ?Vaping Use  ? Vaping Use: Never used  ?Substance and Sexual Activity  ? Alcohol use: Never  ? Drug use: Never  ? Sexual activity: Yes  ?  Partners: Male  ? ? ? ?

## 2021-09-07 ENCOUNTER — Other Ambulatory Visit: Payer: Self-pay | Admitting: Family Medicine

## 2021-09-07 DIAGNOSIS — N200 Calculus of kidney: Secondary | ICD-10-CM | POA: Diagnosis not present

## 2021-09-15 ENCOUNTER — Ambulatory Visit (INDEPENDENT_AMBULATORY_CARE_PROVIDER_SITE_OTHER): Payer: BC Managed Care – PPO | Admitting: Internal Medicine

## 2021-09-15 ENCOUNTER — Encounter: Payer: Self-pay | Admitting: Internal Medicine

## 2021-09-15 VITALS — BP 128/78 | HR 72 | Ht 63.0 in | Wt 179.8 lb

## 2021-09-15 DIAGNOSIS — E039 Hypothyroidism, unspecified: Secondary | ICD-10-CM | POA: Diagnosis not present

## 2021-09-15 DIAGNOSIS — E785 Hyperlipidemia, unspecified: Secondary | ICD-10-CM | POA: Diagnosis not present

## 2021-09-15 DIAGNOSIS — E1165 Type 2 diabetes mellitus with hyperglycemia: Secondary | ICD-10-CM | POA: Diagnosis not present

## 2021-09-15 LAB — POCT GLYCOSYLATED HEMOGLOBIN (HGB A1C): Hemoglobin A1C: 6.4 % — AB (ref 4.0–5.6)

## 2021-09-15 NOTE — Patient Instructions (Signed)
Please continue: - Metformin ER 1000 mg 2x a day  Change: - Glipizide ER 2.5 mg (Crushed) in am, before b'fast  +/- 2.5 mg (crushed) tablet before a large dinner  Please continue Levothyroxine 50 mcg daily.  Take the thyroid hormone every day, with water, at least 30 minutes before breakfast, separated by at least 4 hours from: - acid reflux medications - calcium - iron - multivitamins  Please return in 4 months with your sugar log.

## 2021-09-15 NOTE — Progress Notes (Signed)
Patient ID: Maria Hammond, female   DOB: 1960/12/06, 61 y.o.   MRN: [id number]  This visit occurred during the SARS-CoV-2 public health emergency.  Safety protocols were in place, including screening questions prior to the visit, additional usage of staff PPE, and extensive cleaning of exam room while observing appropriate contact time as indicated for disinfecting solutions.   HPI: Maria Hammond is a 61 y.o.-year-old female, returning for f/u for DM2, dx 2008, non-insulin-dependent, controlled, without long term complications. Last visit 4 months ago.  Interim history: No increased urination, blurry vision, nausea, chest pain. At last visit she was drinking a lot of milk and I strongly advised her to stop. She now drinks 1-2 cups a day.  She has a history of kidney stones. She had capsulitis of the right foot.  She also had to have right knee arthroscopy since last visit.  DM2: Reviewed HbA1c levels: Lab Results  Component Value Date   HGBA1C 6.8 (A) 05/12/2021   HGBA1C 6.8 (A) 01/06/2021   HGBA1C 7.4 (H) 11/04/2020   HGBA1C 7.1 (A) 09/02/2020   HGBA1C 6.9 (A) 04/29/2020   HGBA1C 6.5 (A) 08/27/2019   HGBA1C 6.2 (A) 02/24/2019   HGBA1C 6.5 (A) 11/06/2018   HGBA1C 6.9 (H) 08/12/2018   HGBA1C 6.1 (A) 02/25/2018   HGBA1C 6.6 (A) 10/22/2017   HGBA1C 7.7 (H) 07/19/2017   HGBA1C 7.2 03/13/2017   HGBA1C 7.0 12/11/2016   HGBA1C 7.9 (H) 07/10/2016   HGBA1C 8.1 05/29/2016   HGBA1C 6.5 01/27/2016   HGBA1C 6.3 10/25/2015   HGBA1C 8.7 (H) 07/05/2015   HGBA1C 7.7 05/31/2015  03/13/2017: HbA1c calculated from fructosamine 6.16%  Pt is on a regimen of: - Metformin ER 1000 mg 2x a day - Glipizide ER 2.5 mg in am, before b'fast +/- 2.5 mg crushed tablet before a large dinner We tried Rybelsus 08/2020 - $$$. Retried 04/2021 >> $$$. She could not afford Januvia and Tradjenta. We stopped Amaryl 2 mg in 03/2013 as her sugars were at goal and even had some lows and got lightheaded 4 h after  b'fast, while at work (resolved after having a snack).   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 Highest sugars: 299 (candy) >> 267 >> 222 >> 250 (strawberry shortcake).  Meter: ReliOn  Pt's meals are: - Breakfast (4 am): 3 strips [place] bacon + 1 egg + coffee - splenda and cream - snack: banana, nuts - Lunch: PB sandwich + bag of chips + yoghurt + fruit + diet Mtn Dew - snack: fruit and nuts - Dinner: sauerkraut + sausage She was drinking up 3 gallons of milk a week! >> reduced >> now almond milk  She works starting at 4 AM and has to wake up at 2 AM.  She works until approximately 12 PM.  She goes  to bed around 8 PM.  -No CKD, last BUN/creatinine:  Lab Results  Component Value Date   BUN 11 05/04/2021   CREATININE 0.67 05/04/2021  On lisinopril.  -+ HL; last set of lipids: Lab Results  Component Value Date   CHOL 165 11/04/2020   HDL 65.50 11/04/2020   LDLCALC 88 11/04/2020   LDLDIRECT 66.0 07/13/2014   TRIG 58.0 11/04/2020   CHOLHDL 3 11/04/2020  On Lipitor 10.  - last eye exam was on 10/2020: No DR.  Dr Lady Gary.  - no numbness and tingling in her feet. She had Sx R big toe 02/2019. She sees podiatry. She had numbness in fingertips >> resolved after B12 supplementation. She was found to have a B12 of 192 in 09/2019.  Last foot exam 04/2021.  Hypothyroidism:  Reviewed her TFTs: Lab Results  Component Value Date   TSH 1.24 05/04/2021   TSH 0.44 11/04/2020   TSH 0.72 04/29/2020    Pt is on levothyroxine 50 mcg daily (dose decreased 10/2019), taken: - in am - fasting - at least 30 min from b'fast - no calcium - no iron - no multivitamins - no PPIs - not on Biotin  Pt denies: - feeling nodules in neck - hoarseness - dysphagia - choking - SOB with lying down  ROS: + see HPI  I reviewed pt's medications, allergies, PMH, social hx, family hx, and changes were documented in the history of present illness. Otherwise, unchanged from my initial visit note.  Past Medical History:  Diagnosis Date   Arthritis    DIABETES MELLITUS, TYPE II 11/13/2006   History of kidney stones    HYPERLIPIDEMIA 11/13/2006   HYPERTENSION 05/19/2007   HYPOTHYROIDISM 11/13/2006   TOBACCO USE 12/16/2007   Quit 04/23/10     Past Surgical History:  Procedure Laterality Date   CESAREAN SECTION     COLONOSCOPY  11/11/2009   brodie   IR NEPHROSTOMY PLACEMENT RIGHT  10/15/2020   right foot bone spur surgery      TONSILLECTOMY  1970   TRIGGER FINGER RELEASE     around 2012   Social History   Socioeconomic History   Marital status: Divorced    Spouse name: Not on file   Number of children: Not on file   Years of education: Not on file   Highest education level: Not on file  Occupational History   Not on file  Tobacco Use   Smoking status: Former    Packs/day: 1.00    Years: 30.00    Pack years: 30.00    Types: Cigarettes    Quit date: 04/23/2010    Years since quitting: 11.4   Smokeless tobacco: Never  Vaping Use   Vaping Use: Never used  Substance and Sexual Activity   Alcohol use: Never   Drug use: Never   Sexual activity: Yes    Partners: Male  Other Topics Concern   Not on file  Social History Narrative   New relationship.    In process of divorce (starting 2015- still going 2018). 2 children- boy 90 autism goes to [facility] and works and girl 39 works at the jail in 2016. Son lives with her.  Husband no longer in house. Daughter in [place].       Works at  [employer]- 24 years in May 2018.       Hobbies: tv, facebook/internet. Church.       Regular exercise: not at this time   Caffeine use: Diet Mt Dew  Social Determinants of Health   Financial Resource Strain: Not on file  Food Insecurity: Not on file  Transportation Needs: Not on file  Physical Activity: Not on file  Stress: Not on file  Social Connections: Not on file  Intimate Partner Violence: Not on file   Current Outpatient Medications on File Prior to Visit  Medication Sig Dispense Refill   atorvastatin (LIPITOR) 10 MG tablet Take 1 tablet by mouth once daily 90 tablet 2   Continuous Blood Gluc Sensor (FREESTYLE LIBRE 3 SENSOR) MISC 1 each by Does not apply route every 14 (fourteen) days. 6 each 3   Cyanocobalamin (VITAMIN B-12 PO) Take by mouth.     glipiZIDE (GLUCOTROL XL) 2.5 MG 24 hr tablet TAKE 1 WHOLE TABLET BY MOUTH BEFORE BREAKFAST AND 1 CRUSHED TABLET BY MOUTH BEFORE DINNER (Patient taking differently: Take 2.5 mg by mouth See admin instructions. Take 2.5 mg in the morning and depending on the meal at dinner will take a 2.5 mg crushed with heavy meal) 180 tablet 3   HYDROcodone-acetaminophen (NORCO) 5-325 MG tablet Take 1-2 tablets by mouth 3 (three) times daily as needed. To be taken after surgery 30 tablet 0   levothyroxine (SYNTHROID) 50 MCG tablet Take 1 tablet (50 mcg total) by mouth daily. 90 tablet 2   lisinopril (ZESTRIL) 5 MG tablet Take 1 tablet by mouth once daily 90 tablet 1   metFORMIN (GLUCOPHAGE-XR) 500 MG 24 hr tablet TAKE 2 TABLETS BY MOUTH TWICE DAILY WITH A MEAL (Patient taking differently: Take 1,000 mg by mouth 2 (two) times daily.) 360 tablet 3   methylPREDNISolone (MEDROL DOSEPAK) 4 MG TBPK tablet As directed 21 tablet 0   ondansetron (ZOFRAN) 4 MG tablet Take 1 tablet (4 mg total) by mouth every 8 (eight) hours as needed for nausea or vomiting. 40 tablet 0   Semaglutide (RYBELSUS) 7 MG TABS Take 7 mg by mouth daily. 90 tablet 3   VITAMIN D PO Take by  mouth.     Vitamin D, Ergocalciferol, (DRISDOL) 1.25 MG (50000 UNIT) CAPS capsule Take 1 capsule (50,000 Units total) by mouth every 7 (seven) days. 12 capsule 0   No current facility-administered medications on file prior to visit.   No Known Allergies Family History  Problem Relation Age of Onset   Diabetes Mother    Heart disease Father    Kidney disease Father    Prostate cancer Father    Breast cancer Sister        age 43   Diabetes Brother    Diabetes Brother    Colon cancer Neg Hx    Colon polyps Neg Hx    Esophageal cancer Neg Hx    Rectal cancer Neg Hx    Stomach cancer Neg Hx     PE: BP 128/78 (BP Location: Left Arm, Patient Position: Sitting, Cuff Size: Normal)   Pulse 72   Ht '5\' 3"'$  (1.6 m)   Wt 179 lb 12.8 oz (81.6 kg)   LMP 12/20/2011   SpO2 97%   BMI 31.85 kg/m    Wt Readings from Last 3 Encounters:  09/15/21 179 lb 12.8 oz (81.6 kg)  05/12/21 178 lb 12.8 oz (81.1 kg)  05/04/21 (P) 182 lb (82.6 kg)   Constitutional: overweight, in NAD Eyes: PERRLA, EOMI, no exophthalmos ENT: moist mucous membranes, no thyromegaly, no cervical lymphadenopathy Cardiovascular: RRR, No MRG Respiratory: CTA B Musculoskeletal: no deformities, strength intact in all 4 Skin: moist, warm, no rashes Neurological: no tremor  with outstretched hands, DTR normal in all 4  ASSESSMENT: 1. DM2, non-insulin-dependent, controlled, without long term complications, but with occasional hyperglycemia -The Freestyle libre CGM was not covered by her insurance  2. Hypothyroidism  3. HL  PLAN:  1. Patient with generally well-controlled type 2 diabetes, on metformin ER (had bloating with IR metformin), sulfonylurea, and p.o. GLP-1 receptor agonist, started at last visit.  I suggested Rybelsus before, but this was not covered by her insurance last year and also this year. -In the past she was drinking a huge volume of milk every week in the form of chocolate milk and we discussed about the  absolute need to stop this.  After she cut down on the amount of milk sugars improved.  At last visit, HbA1c was stable, at 6.8%.  At that time I suggested a freestyle libre CGM.  She was able to obtain this. CGM interpretation: -At today's visit, we reviewed her CGM downloads: It appears that 94% of values are in target range (goal >70%), while 6% are higher than 180 (goal <25%), and 0% are lower than 70 (goal <4%).  The calculated average blood sugar is 126.  The projected HbA1c for the next 3 months (GMI) is 6.3%. -Reviewing the CGM trends, sugars appear to be well controlled, but she has lower blood sugars overnight, close to the lower limit of the target range.  Also, between meals, sometimes the sugars can be lower.  We discussed about switching from the extended release glipizide to instant release glipizide in the morning.  Before dinner, she should not take glipizide consistently, only take it before larger dinners.  If the sugars continue to decrease, we may need to stop glipizide or take it only before a larger breakfast. -Unfortunately, Rybelsus and DPP 4 inhibitors are not affordable. - I suggested to:  Patient Instructions  Please continue: - Metformin ER 1000 mg 2x a day  Change: - Glipizide ER 2.5 mg (Crushed) in am, before b'fast  +/- 2.5 mg (crushed) tablet before a large dinner  Please continue Levothyroxine 50 mcg daily.  Take the thyroid hormone every day, with water, at least 30 minutes before breakfast, separated by at least 4 hours from: - acid reflux medications - calcium - iron - multivitamins  Please return in 4 months with your sugar log.   - we checked her HbA1c: 6.4% (lower) - advised to check sugars at different times of the day - 4x a day, rotating check times - advised for yearly eye exams >> she is UTD - return to clinic in 4 months  2. Hypothyroidism - latest thyroid labs reviewed with pt. >> normal: Lab Results  Component Value Date   TSH 1.24  05/04/2021  - she continues on LT4 50 mcg daily - pt feels good on this dose. - we discussed about taking the thyroid hormone every day, with water, >30 minutes before breakfast, separated by >4 hours from acid reflux medications, calcium, iron, multivitamins. Pt. is taking it correctly.  3. HL -Reviewed latest lipid panel from 10/2020: Fractions at goal with the exception of an LDL higher than 70: Lab Results  Component Value Date   CHOL 165 11/04/2020   HDL 65.50 11/04/2020   LDLCALC 88 11/04/2020   LDLDIRECT 66.0 07/13/2014   TRIG 58.0 11/04/2020   CHOLHDL 3 11/04/2020  -She continues on Lipitor 10 mg daily without side effects  Philemon Kingdom, MD PhD [facility] Endocrinology

## 2021-09-16 ENCOUNTER — Other Ambulatory Visit: Payer: Self-pay | Admitting: Internal Medicine

## 2021-09-16 DIAGNOSIS — E1165 Type 2 diabetes mellitus with hyperglycemia: Secondary | ICD-10-CM

## 2021-10-03 ENCOUNTER — Telehealth: Payer: Self-pay | Admitting: Family Medicine

## 2021-10-03 ENCOUNTER — Other Ambulatory Visit: Payer: Self-pay | Admitting: Family Medicine

## 2021-10-03 MED ORDER — ATORVASTATIN CALCIUM 10 MG PO TABS: 10.0000 mg | ORAL_TABLET | Freq: Every day | ORAL | 0 refills | Status: DC

## 2021-10-03 NOTE — Telephone Encounter (Signed)
Pt is aware that I had paper work but stated she pull it from [employer].

## 2021-10-03 NOTE — Telephone Encounter (Signed)
Pt call and stated she need a refill on atorvastatin (LIPITOR) 10 MG tablet for on mo sent to  [place], [place]. Phone:  [phone]  Fax:  [phone]    Until her mail order come in.

## 2021-11-03 ENCOUNTER — Other Ambulatory Visit: Payer: Self-pay | Admitting: Family Medicine

## 2021-11-03 DIAGNOSIS — Z1231 Encounter for screening mammogram for malignant neoplasm of breast: Secondary | ICD-10-CM

## 2021-12-01 ENCOUNTER — Ambulatory Visit
Admission: RE | Admit: 2021-12-01 | Discharge: 2021-12-01 | Disposition: A | Discharge status: Home / Self Care | Payer: BC Managed Care – PPO | Source: Ambulatory Visit | Attending: Family Medicine | Admitting: Family Medicine

## 2021-12-01 DIAGNOSIS — Z1231 Encounter for screening mammogram for malignant neoplasm of breast: Secondary | ICD-10-CM

## 2021-12-06 ENCOUNTER — Ambulatory Visit (INDEPENDENT_AMBULATORY_CARE_PROVIDER_SITE_OTHER): Payer: BC Managed Care – PPO | Admitting: Orthopaedic Surgery

## 2021-12-06 ENCOUNTER — Encounter: Payer: Self-pay | Admitting: Orthopaedic Surgery

## 2021-12-06 ENCOUNTER — Ambulatory Visit (INDEPENDENT_AMBULATORY_CARE_PROVIDER_SITE_OTHER): Payer: BC Managed Care – PPO

## 2021-12-06 DIAGNOSIS — Z9889 Other specified postprocedural states: Secondary | ICD-10-CM

## 2021-12-06 DIAGNOSIS — M25561 Pain in right knee: Secondary | ICD-10-CM

## 2021-12-06 DIAGNOSIS — G8929 Other chronic pain: Secondary | ICD-10-CM

## 2021-12-06 NOTE — Progress Notes (Signed)
Office Visit Note   Patient: Maria Hammond           Date of Birth: 08-29-1960           MRN: [id number] Visit Date: 12/06/2021              Requested by: Billie Ruddy, MD [address] [place],  [place] [address] PCP: Billie Ruddy, MD   Assessment & Plan: Visit Diagnoses:  1. S/P right knee arthroscopy   2. Chronic pain of right knee     Plan: [name] returns today for chronic right knee pain.  Underwent knee scope about 6 months ago.  Feels pain around the patella that is worse with activity.  Examination of right knee shows fully healed surgical scars.  No evidence of recurrent formation of the cyst.  No joint effusion.  X-rays demonstrate moderate arthritis worse in the patellofemoral joint.  Impression is right knee osteoarthritis.  Low suspicion for recurrent medial meniscal tear.  Findings consistent with arthritis.  Treatment options were reviewed.  Patient will continue with oral NSAIDs and will try Voltaren gel and will look at Visco injection as a possibility.  Follow-Up Instructions: No follow-ups on file.   Orders:  Orders Placed This Encounter  Procedures   XR KNEE 3 VIEW RIGHT   No orders of the defined types were placed in this encounter.     Procedures: No procedures performed   Clinical Data: No additional findings.   Subjective: Chief Complaint  Patient presents with   Right Knee - Pain    Had arthroscopic surgery 05/2021    HPI  Review of Systems  Constitutional: Negative.   HENT: Negative.    Eyes: Negative.   Respiratory: Negative.    Cardiovascular: Negative.   Endocrine: Negative.   Musculoskeletal: Negative.   Neurological: Negative.   Hematological: Negative.   Psychiatric/Behavioral: Negative.    All other systems reviewed and are negative.    Objective: Vital Signs: LMP 12/20/2011   Physical Exam Vitals and nursing note reviewed.  Constitutional:      Appearance: She is well-developed.  Pulmonary:      Effort: Pulmonary effort is normal.  Skin:    General: Skin is warm.     Capillary Refill: Capillary refill takes less than 2 seconds.  Neurological:     Mental Status: She is alert and oriented to person, place, and time.  Psychiatric:        Behavior: Behavior normal.        Thought Content: Thought content normal.        Judgment: Judgment normal.     Ortho Exam  Specialty Comments:  No specialty comments available.  Imaging: XR KNEE 3 VIEW RIGHT  Result Date: 12/06/2021 Mild to moderate osteoarthritis worse in the patellofemoral compartment    PMFS History: Patient Active Problem List   Diagnosis Date Noted   Perimeniscal cyst of right knee 06/09/2021   Acute medial meniscus tear, right, initial encounter 06/09/2021   Unilateral primary osteoarthritis, left knee 01/16/2018   Trigger middle finger of right hand 07/21/2015   Obesity 01/26/2012   Essential hypertension 05/19/2007   Hypothyroidism 11/13/2006   Type 2 diabetes mellitus with hyperglycemia ([place]) 11/13/2006   Hyperlipidemia 11/13/2006   Past Medical History:  Diagnosis Date   Arthritis    DIABETES MELLITUS, TYPE II 11/13/2006   History of kidney stones    HYPERLIPIDEMIA 11/13/2006   HYPERTENSION 05/19/2007   HYPOTHYROIDISM 11/13/2006   TOBACCO  USE 12/16/2007   Quit 04/23/10      Family History  Problem Relation Age of Onset   Diabetes Mother    Heart disease Father    Kidney disease Father    Prostate cancer Father    Breast cancer Sister        age 58   Diabetes Brother    Diabetes Brother    Colon cancer Neg Hx    Colon polyps Neg Hx    Esophageal cancer Neg Hx    Rectal cancer Neg Hx    Stomach cancer Neg Hx     Past Surgical History:  Procedure Laterality Date   CESAREAN SECTION     COLONOSCOPY  11/11/2009   brodie   IR NEPHROSTOMY PLACEMENT RIGHT  10/15/2020   right foot bone spur surgery      TONSILLECTOMY  1970   TRIGGER FINGER RELEASE     around 2012   Social History    Occupational History   Not on file  Tobacco Use   Smoking status: Former    Packs/day: 1.00    Years: 30.00    Total pack years: 30.00    Types: Cigarettes    Quit date: 04/23/2010    Years since quitting: 11.6   Smokeless tobacco: Never  Vaping Use   Vaping Use: Never used  Substance and Sexual Activity   Alcohol use: Never   Drug use: Never   Sexual activity: Yes    Partners: Male

## 2021-12-11 ENCOUNTER — Other Ambulatory Visit: Payer: Self-pay | Admitting: Internal Medicine

## 2021-12-11 DIAGNOSIS — E039 Hypothyroidism, unspecified: Secondary | ICD-10-CM

## 2021-12-14 ENCOUNTER — Other Ambulatory Visit: Payer: Self-pay | Admitting: Internal Medicine

## 2021-12-14 ENCOUNTER — Telehealth: Payer: Self-pay | Admitting: Orthopaedic Surgery

## 2021-12-14 ENCOUNTER — Other Ambulatory Visit: Payer: Self-pay | Admitting: Family Medicine

## 2021-12-14 ENCOUNTER — Telehealth: Payer: Self-pay

## 2021-12-14 DIAGNOSIS — E1165 Type 2 diabetes mellitus with hyperglycemia: Secondary | ICD-10-CM

## 2021-12-14 NOTE — Telephone Encounter (Signed)
Pt called stating per discussion with dr. Erlinda Hong she would call when ready to submit to insurance company for right knee gel injection. Please call pt when approved. Pt phone number is [phone].

## 2021-12-14 NOTE — Telephone Encounter (Signed)
Error

## 2021-12-14 NOTE — Telephone Encounter (Signed)
VOB submitted for SynviscOne, right knee.  

## 2021-12-15 ENCOUNTER — Ambulatory Visit (INDEPENDENT_AMBULATORY_CARE_PROVIDER_SITE_OTHER): Payer: BC Managed Care – PPO | Admitting: Podiatry

## 2021-12-15 ENCOUNTER — Ambulatory Visit (INDEPENDENT_AMBULATORY_CARE_PROVIDER_SITE_OTHER): Payer: BC Managed Care – PPO

## 2021-12-15 DIAGNOSIS — M2041 Other hammer toe(s) (acquired), right foot: Secondary | ICD-10-CM

## 2021-12-15 DIAGNOSIS — M778 Other enthesopathies, not elsewhere classified: Secondary | ICD-10-CM

## 2021-12-15 NOTE — Progress Notes (Signed)
Subjective:   Patient ID: Maria Hammond, female   DOB: 61 y.o.   MRN: [id number]   HPI Patient states she has been getting a lot of pain underneath her foot recently and its really been bothering her for at least the last 9 months to a year.  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  I have recommended shortening osteotomy along with shortening digital fusion procedure and I allowed her to read consent form going over alternative treatments complications associated with surgery and after extensive review she signed consent form is given all preoperative instructions.  Patient is scheduled for outpatient surgery and all questions were answered and she understand she will the pin in the toe for 5 weeks the total recovery period will take 4 to 6 months.  Patient scheduled for outpatient surgery cam walker dispensed today with all instructions on usage and it was fitted properly to her lower leg and she will wear it some prior to surgery to settle down the symptoms.  Patient scheduled encouraged to call questions concerns  Xrays indicate elongated 2nd met right and elongated 2nd digit with 1st mpj healing well and pins intact

## 2021-12-16 ENCOUNTER — Telehealth: Payer: Self-pay

## 2021-12-16 NOTE — Telephone Encounter (Signed)
DOS 01/10/2022  METATARSAL OSTEOTOMY 2ND RT - 28308 HAMMERTOE REPAIR 2ND RT - 28285  [employer] EFFECTIVE DATE - 04/24/2021  PLAN DEDUCTIBLE - $1750.00 W/ $0.00 REMAINING OUT OF POCKET - $6850.00 W/ $3254.00 REMAINING COPAY $0.00 COINSURANCE - 25% PER SERVICE YEAR  SPOKE TO KELSI T AT [facility] STATED NO PRECERT REQUIRED FOR CPT [phone] & [phone]. CALL REF # Reubin Milan T 12/16/2021

## 2021-12-21 DIAGNOSIS — M79676 Pain in unspecified toe(s): Secondary | ICD-10-CM

## 2022-01-03 ENCOUNTER — Telehealth: Payer: Self-pay

## 2022-01-03 ENCOUNTER — Other Ambulatory Visit: Payer: Self-pay

## 2022-01-03 DIAGNOSIS — Z9889 Other specified postprocedural states: Secondary | ICD-10-CM

## 2022-01-03 NOTE — Telephone Encounter (Signed)
I would suggest repeating the MRI.

## 2022-01-03 NOTE — Telephone Encounter (Signed)
Talked with patient and advised her that her insurance [place] does not cover gel injections at all.  Patient voiced that she understands.  Please advise on next option for patient.

## 2022-01-03 NOTE — Telephone Encounter (Signed)
Spoke to [place]  regarding GLP-1 Receptor Agonists that need to be held prior to anesthesia. She stated she is not taking Semaglutide (Rybelisus).

## 2022-01-09 MED ORDER — HYDROCODONE-ACETAMINOPHEN 10-325 MG PO TABS: 1.0000 | ORAL_TABLET | Freq: Three times a day (TID) | ORAL | 0 refills | Status: AC | PRN

## 2022-01-09 NOTE — Addendum Note (Signed)
Addended by: Wallene Huh on: 01/09/2022 02:24 PM   Modules accepted: Orders

## 2022-01-10 ENCOUNTER — Encounter: Payer: Self-pay | Admitting: Podiatry

## 2022-01-10 DIAGNOSIS — M205X1 Other deformities of toe(s) (acquired), right foot: Secondary | ICD-10-CM | POA: Diagnosis not present

## 2022-01-10 DIAGNOSIS — M21541 Acquired clubfoot, right foot: Secondary | ICD-10-CM | POA: Diagnosis not present

## 2022-01-10 DIAGNOSIS — M2041 Other hammer toe(s) (acquired), right foot: Secondary | ICD-10-CM | POA: Diagnosis not present

## 2022-01-16 ENCOUNTER — Ambulatory Visit (INDEPENDENT_AMBULATORY_CARE_PROVIDER_SITE_OTHER): Payer: BC Managed Care – PPO

## 2022-01-16 ENCOUNTER — Encounter: Payer: Self-pay | Admitting: Podiatry

## 2022-01-16 ENCOUNTER — Ambulatory Visit (INDEPENDENT_AMBULATORY_CARE_PROVIDER_SITE_OTHER): Payer: BC Managed Care – PPO | Admitting: Podiatry

## 2022-01-16 DIAGNOSIS — Z9889 Other specified postprocedural states: Secondary | ICD-10-CM

## 2022-01-16 DIAGNOSIS — M2041 Other hammer toe(s) (acquired), right foot: Secondary | ICD-10-CM

## 2022-01-17 NOTE — Progress Notes (Signed)
Subjective:   Patient ID: Maria Hammond, female   DOB: 61 y.o.   MRN: [id number]   HPI Patient states doing very well with surgery very pleased so far   ROS      Objective:  Physical Exam  Neurovascular status intact negative [name]' sign noted wound edges well coapted pin in place wound edges doing well no drainage noted good alignment of the digit     Assessment:  Doing well post osteotomy right second metatarsal digital fusion     Plan:  Reviewed condition and reapplied sterile dressing continue elevation compression immobilization and patient will return 4 weeks pin removal earlier if needed  X-rays indicate that the osteotomy is healing well digits in good alignment pin is intact

## 2022-01-19 ENCOUNTER — Encounter: Payer: Self-pay | Admitting: Internal Medicine

## 2022-01-19 ENCOUNTER — Ambulatory Visit (INDEPENDENT_AMBULATORY_CARE_PROVIDER_SITE_OTHER): Payer: BC Managed Care – PPO | Admitting: Internal Medicine

## 2022-01-19 VITALS — BP 110/62 | HR 65 | Ht 63.0 in | Wt 178.2 lb

## 2022-01-19 DIAGNOSIS — E785 Hyperlipidemia, unspecified: Secondary | ICD-10-CM

## 2022-01-19 DIAGNOSIS — E039 Hypothyroidism, unspecified: Secondary | ICD-10-CM | POA: Diagnosis not present

## 2022-01-19 DIAGNOSIS — E1165 Type 2 diabetes mellitus with hyperglycemia: Secondary | ICD-10-CM

## 2022-01-19 LAB — POCT GLYCOSYLATED HEMOGLOBIN (HGB A1C): Hemoglobin A1C: 6.5 % — AB (ref 4.0–5.6)

## 2022-01-19 LAB — LIPID PANEL
Cholesterol: 174 mg/dL (ref 0–200)
HDL: 63.2 mg/dL (ref >39.00)
LDL Cholesterol: 95 mg/dL (ref 0–99)
NonHDL: 110.81
Total CHOL/HDL Ratio: 3
Triglycerides: 78 mg/dL (ref 0.0–149.0)
VLDL: 15.6 mg/dL (ref 0.0–40.0)

## 2022-01-19 LAB — TSH: TSH: 0.92 u[IU]/mL (ref 0.35–5.50)

## 2022-01-19 MED ORDER — LEVOTHYROXINE SODIUM 50 MCG PO TABS: 50.0000 ug | ORAL_TABLET | Freq: Every day | ORAL | 3 refills | Status: DC

## 2022-01-19 MED ORDER — METFORMIN HCL ER 500 MG PO TB24: ORAL_TABLET | ORAL | 3 refills | Status: DC

## 2022-01-19 MED ORDER — GLIPIZIDE ER 2.5 MG PO TB24: ORAL_TABLET | ORAL | 3 refills | Status: DC

## 2022-01-19 NOTE — Patient Instructions (Addendum)
Please continue: - Metformin ER 1000 mg 2x a day - Glipizide ER 2.5 mg (crushed) before b'fast.  You may use a 2.5 mg (crushed) tablet before a large dinner.  Please continue Levothyroxine 50 mcg daily.  Take the thyroid hormone every day, with water, at least 30 minutes before breakfast, separated by at least 4 hours from: - acid reflux medications - calcium - iron - multivitamins  Please stop at the lab.  Please return in 4 months.

## 2022-01-19 NOTE — Progress Notes (Addendum)
Patient ID: Maria Hammond, female   DOB: 1960/12/27, 61 y.o.   MRN: [id number]  HPI: Maria Hammond is a 61 y.o.-year-old female, returning for f/u for DM2, dx 2008, non-insulin-dependent, controlled, without long term complications. Last visit 4 months ago.  Interim history: No increased urination, blurry vision, nausea, chest pain. She had foot surgery 5 days ago  - got a steroid injection >> sugars increased to 300s.   DM2: Reviewed HbA1c levels: Lab Results  Component Value Date   HGBA1C 6.4 (A) 09/15/2021   HGBA1C 6.8 (A) 05/12/2021   HGBA1C 6.8 (A) 01/06/2021   HGBA1C 7.4 (H) 11/04/2020   HGBA1C 7.1 (A) 09/02/2020   HGBA1C 6.9 (A) 04/29/2020   HGBA1C 6.5 (A) 08/27/2019   HGBA1C 6.2 (A) 02/24/2019   HGBA1C 6.5 (A) 11/06/2018   HGBA1C 6.9 (H) 08/12/2018   HGBA1C 6.1 (A) 02/25/2018   HGBA1C 6.6 (A) 10/22/2017   HGBA1C 7.7 (H) 07/19/2017   HGBA1C 7.2 03/13/2017   HGBA1C 7.0 12/11/2016   HGBA1C 7.9 (H) 07/10/2016   HGBA1C 8.1 05/29/2016   HGBA1C 6.5 01/27/2016   HGBA1C 6.3 10/25/2015   HGBA1C 8.7 (H) 07/05/2015  03/13/2017: HbA1c calculated from fructosamine 6.16%  Pt is on a regimen of: - Metformin ER 1000 mg 2x a day - Glipizide ER 2.5 mg in am (crushed), before b'fast +/- 2.5 mg crushed tablet before a large dinner We tried Rybelsus 08/2020 - $$$. Retried 04/2021 >> $$$. She could not afford Januvia and Tradjenta. We stopped Amaryl 2 mg in 03/2013 as her sugars were at goal and even had some lows and got lightheaded 4 h after b'fast, while at work (resolved after having a snack).   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 Highest sugars: 250 (strawberry shortcake) >> 300.  Meter: ReliOn  Pt's meals are: - Breakfast (4 am): 3 strips [place] bacon + 1 egg + coffee - splenda and cream - snack: banana, nuts - Lunch: PB sandwich + bag of chips + yoghurt + fruit + diet Mtn Dew - snack:  fruit and nuts - Dinner: sauerkraut + sausage She was drinking up 3 gallons of milk a week! >> reduced >> now almond milk  She works starting at 4 AM and has to wake up at 2 AM.  She works until approximately 12 PM.  She goes to bed around 8 PM.  -No CKD, last BUN/creatinine:  Lab Results  Component Value Date   BUN 11 05/04/2021   CREATININE 0.67 05/04/2021  On lisinopril.  -+ HL; last set of lipids: Lab Results  Component Value Date   CHOL 165 11/04/2020   HDL 65.50 11/04/2020   LDLCALC 88 11/04/2020   LDLDIRECT 66.0 07/13/2014   TRIG 58.0 11/04/2020   CHOLHDL 3 11/04/2020  On Lipitor 10.  - last eye exam was on 12/2021: No DR reportedly.  Dr Lady Gary.  - no numbness and tingling in her feet. She had Sx R big toe 02/2019. She sees podiatry. She had numbness in fingertips >> resolved after B12 supplementation. She was found to have a B12 of 192 in 09/2019.  Last foot exam 04/2021.  Hypothyroidism:  Reviewed her TFTs: Lab Results  Component Value Date   TSH 1.24 05/04/2021   TSH 0.44 11/04/2020   TSH 0.72 04/29/2020   Pt is on levothyroxine 50 mcg daily (dose decreased 10/2019), taken: - in am -  fasting - at least 30 min from b'fast - no calcium - no iron - no multivitamins - no PPIs - not on Biotin  Pt denies: - feeling nodules in neck - hoarseness - dysphagia - choking  ROS: + see HPI  I reviewed pt's medications, allergies, PMH, social hx, family hx, and changes were documented in the history of present illness. Otherwise, unchanged from my initial visit note.  Past Medical History:  Diagnosis Date   Arthritis    DIABETES MELLITUS, TYPE II 11/13/2006   History of kidney stones    HYPERLIPIDEMIA 11/13/2006   HYPERTENSION 05/19/2007   HYPOTHYROIDISM 11/13/2006   TOBACCO USE 12/16/2007   Quit 04/23/10     Past Surgical History:  Procedure Laterality Date   CESAREAN SECTION     COLONOSCOPY  11/11/2009   brodie   IR NEPHROSTOMY PLACEMENT RIGHT   10/15/2020   right foot bone spur surgery      TONSILLECTOMY  1970   TRIGGER FINGER RELEASE     around 2012   Social History   Socioeconomic History   Marital status: Divorced    Spouse name: Not on file   Number of children: Not on file   Years of education: Not on file   Highest education level: Not on file  Occupational History   Not on file  Tobacco Use   Smoking status: Former    Packs/day: 1.00    Years: 30.00    Total pack years: 30.00    Types: Cigarettes    Quit date: 04/23/2010    Years since quitting: 11.7   Smokeless tobacco: Never  Vaping Use   Vaping Use: Never used  Substance and Sexual Activity   Alcohol use: Never   Drug use: Never   Sexual activity: Yes    Partners: Male  Other Topics Concern   Not on file  Social History Narrative   New relationship.    In process of divorce (starting 2015- still going 2018). 2 children- boy 69 autism goes to [facility] and works and girl 39 works at the jail in 2016. Son lives with her.  Husband no longer in house. Daughter in [place].       Works at [employer]- 24 years in May 2018.       Hobbies: tv, facebook/internet. Church.       Regular exercise: not at this time   Caffeine use: Diet Mt Dew   Social Determinants of Health   Financial Resource Strain: Not on file  Food Insecurity: Not on file  Transportation Needs: Not on file  Physical Activity: Not on file  Stress: Not on file  Social Connections: Not on file  Intimate Partner Violence: Not on file   Current Outpatient Medications on File Prior to Visit  Medication Sig Dispense Refill   atorvastatin (LIPITOR) 10 MG tablet Take 1 tablet by mouth once daily 90 tablet 0   Continuous Blood Gluc Sensor (FREESTYLE LIBRE 3 SENSOR) MISC 1 each by Does not apply route every 14 (fourteen) days. 6 each 3   Cyanocobalamin (VITAMIN B-12 PO) Take by mouth.     glipiZIDE (GLUCOTROL XL) 2.5 MG 24 hr tablet TAKE 1 WHOLE TABLET BY MOUTH BEFORE BREAKFAST AND 1 CRUSHED TABLET  AFTER SUPPER. 180 tablet 0   HYDROcodone-acetaminophen (NORCO) 5-325 MG tablet Take 1-2 tablets by mouth 3 (three) times daily as needed. To be taken after surgery 30 tablet 0   levothyroxine (SYNTHROID) 50 MCG tablet Take 1 tablet by mouth  once daily 90 tablet 0   lisinopril (ZESTRIL) 5 MG tablet Take 1 tablet by mouth once daily 90 tablet 1   metFORMIN (GLUCOPHAGE-XR) 500 MG 24 hr tablet TAKE 2 TABLETS BY MOUTH TWICE DAILY WITH A MEAL 360 tablet 0   methylPREDNISolone (MEDROL DOSEPAK) 4 MG TBPK tablet As directed 21 tablet 0   ondansetron (ZOFRAN) 4 MG tablet Take 1 tablet (4 mg total) by mouth every 8 (eight) hours as needed for nausea or vomiting. 40 tablet 0   Semaglutide (RYBELSUS) 7 MG TABS Take 7 mg by mouth daily. 90 tablet 3   VITAMIN D PO Take by mouth.     Vitamin D, Ergocalciferol, (DRISDOL) 1.25 MG (50000 UNIT) CAPS capsule Take 1 capsule (50,000 Units total) by mouth every 7 (seven) days. 12 capsule 0   No current facility-administered medications on file prior to visit.   No Known Allergies Family History  Problem Relation Age of Onset   Diabetes Mother    Heart disease Father    Kidney disease Father    Prostate cancer Father    Breast cancer Sister        age 33   Diabetes Brother    Diabetes Brother    Colon cancer Neg Hx    Colon polyps Neg Hx    Esophageal cancer Neg Hx    Rectal cancer Neg Hx    Stomach cancer Neg Hx     PE: BP 110/62 (BP Location: Left Arm, Patient Position: Sitting, Cuff Size: Normal)   Pulse 65   Ht '5\' 3"'$  (1.6 m)   Wt 178 lb 3.2 oz (80.8 kg)   LMP 12/20/2011   SpO2 97%   BMI 31.57 kg/m    Wt Readings from Last 3 Encounters:  01/19/22 178 lb 3.2 oz (80.8 kg)  09/15/21 179 lb 12.8 oz (81.6 kg)  05/12/21 178 lb 12.8 oz (81.1 kg)   Constitutional: overweight, in NAD Eyes:  EOMI, no exophthalmos ENT: no neck masses, no cervical lymphadenopathy Cardiovascular: RRR, No MRG Respiratory: CTA B Musculoskeletal: no deformities Skin:no  rashes Neurological: no tremor with outstretched hands  ASSESSMENT: 1. DM2, non-insulin-dependent, controlled, without long term complications, but with occasional hyperglycemia -The Freestyle libre CGM was not covered by her insurance  2. Hypothyroidism  3. HL  PLAN:  1. Patient with fairly well-controlled type 2 diabetes, on metformin ER (had bloating with metformin IR) as polyuria.  We tried to use Rybelsus in the past but this was not covered by her insurance.  Also, DPP 4 inhibitors were not affordable.  She was finally able to obtain a freestyle libre CGM and for review of the tracings at last visit, sugars appear to be well controlled, lower overnight and between meals.  I advised her to switch from the extended release to instant release glipizide before breakfast.  Before dinner, I advised her to only take this before larger meals.  HbA1c at last visit was excellent, at 6.4%. CGM interpretation: -At today's visit, we reviewed her CGM downloads: It appears that 88% of values are in target range (goal >70%), while 12% are higher than 180 (goal <25%), and 0% are lower than 70 (goal <4%).  The calculated average blood sugar is 131.  The projected HbA1c for the next 3 months (GMI) is 6.4%. -Reviewing the CGM trends, sugars appear to be slightly more fluctuating than before, with more higher blood sugars after dinner.  Upon questioning, she has not used her dinnertime glipizide dose.  She  did have a blood sugar in the 300s after getting a steroid injection around the time of her foot surgery.  However, the vast majority of the blood sugars are still at goal.  -Therefore, at today's visit, I advised her to continue current regimen.  Especially with the holidays coming up, we discussed about possibly adding a 2.5 mg glipizide before dinner if she is planning to eat a larger meal - I suggested to:  Patient Instructions  Please continue: - Metformin ER 1000 mg 2x a day - Glipizide ER 2.5 mg  (crushed) before b'fast.  You may use a 2.5 mg (crushed) tablet before a large dinner.  Please continue Levothyroxine 50 mcg daily.  Take the thyroid hormone every day, with water, at least 30 minutes before breakfast, separated by at least 4 hours from: - acid reflux medications - calcium - iron - multivitamins  Please stop at the lab.  Please return in 4 months.  - we checked her HbA1c: 6.5% (slightly higher) - advised to check sugars at different times of the day - 4x a day, rotating check times - advised for yearly eye exams >> she is UTD - return to clinic in 4 months  2. Hypothyroidism - latest thyroid labs reviewed with pt. >> normal: Lab Results  Component Value Date   TSH 1.24 05/04/2021  - she continues on LT4 50 mcg daily - pt feels good on this dose. - we discussed about taking the thyroid hormone every day, with water, >30 minutes before breakfast, separated by >4 hours from acid reflux medications, calcium, iron, multivitamins. Pt. is taking it correctly. - will check thyroid test today: TSH  - If labs are abnormal, she will need to return for repeat TFTs in 1.5 months  3. HL -Reviewed latest lipid panel from 10/2020: Fractions at goal with exception of an LDL higher than 70 Lab Results  Component Value Date   CHOL 165 11/04/2020   HDL 65.50 11/04/2020   LDLCALC 88 11/04/2020   LDLDIRECT 66.0 07/13/2014   TRIG 58.0 11/04/2020   CHOLHDL 3 11/04/2020  -She continues on Lipitor 10 mg daily without side effects -she is due for another lipid panel-we will check this today  Needs refills - LT4.  Component     Latest Ref Rng 01/19/2022  Cholesterol     0 - 200 mg/dL 174   Triglycerides     0.0 - 149.0 mg/dL 78.0   HDL Cholesterol     >39.00 mg/dL 63.20   VLDL     0.0 - 40.0 mg/dL 15.6   LDL (calc)     0 - 99 mg/dL 95   Total CHOL/HDL Ratio 3   NonHDL 110.81   TSH     0.35 - 5.50 uIU/mL 0.92    TFTs are normal.  We will refill the same dose of  levothyroxine. LDL is higher.  I would suggest to increase the dose of Lipitor to 20 mg daily.  Philemon Kingdom, MD PhD [facility]

## 2022-02-13 ENCOUNTER — Ambulatory Visit (INDEPENDENT_AMBULATORY_CARE_PROVIDER_SITE_OTHER): Payer: BC Managed Care – PPO | Admitting: Podiatry

## 2022-02-13 ENCOUNTER — Ambulatory Visit (INDEPENDENT_AMBULATORY_CARE_PROVIDER_SITE_OTHER): Payer: BC Managed Care – PPO

## 2022-02-13 ENCOUNTER — Encounter: Payer: Self-pay | Admitting: Podiatry

## 2022-02-13 DIAGNOSIS — Z9889 Other specified postprocedural states: Secondary | ICD-10-CM | POA: Diagnosis not present

## 2022-02-13 NOTE — Progress Notes (Signed)
Subjective:   Patient ID: Maria Hammond, female   DOB: 61 y.o.   MRN: [id number]   HPI Patient presents for pin removal right stating she is doing very well   ROS      Objective:  Physical Exam  Neuro neurovascular status intact negative [name]' sign noted with patient's right second toe healing well pin in place second metatarsal healing well      Assessment:  Doing well post surgery right good alignment of the pin      Plan:  Pin removed second digit sterile dressing applied excellent alignment good healing occurring with good structural correction noted.  May gradually increase in activity and return to shoe gear ankle compression stocking dispensed currently today  X-rays indicate osteotomies healing well screw in place second toe good alignment

## 2022-02-15 ENCOUNTER — Ambulatory Visit
Admission: RE | Admit: 2022-02-15 | Discharge: 2022-02-15 | Disposition: A | Discharge status: Home / Self Care | Payer: BC Managed Care – PPO | Source: Ambulatory Visit | Attending: Orthopaedic Surgery | Admitting: Orthopaedic Surgery

## 2022-02-15 DIAGNOSIS — M1711 Unilateral primary osteoarthritis, right knee: Secondary | ICD-10-CM | POA: Diagnosis not present

## 2022-02-15 DIAGNOSIS — M8548 Solitary bone cyst, other site: Secondary | ICD-10-CM | POA: Diagnosis not present

## 2022-02-15 DIAGNOSIS — R531 Weakness: Secondary | ICD-10-CM | POA: Diagnosis not present

## 2022-02-15 DIAGNOSIS — R5381 Other malaise: Secondary | ICD-10-CM | POA: Diagnosis not present

## 2022-02-15 DIAGNOSIS — Z9889 Other specified postprocedural states: Secondary | ICD-10-CM

## 2022-02-17 ENCOUNTER — Ambulatory Visit (INDEPENDENT_AMBULATORY_CARE_PROVIDER_SITE_OTHER): Payer: BC Managed Care – PPO | Admitting: Orthopaedic Surgery

## 2022-02-17 DIAGNOSIS — Z9889 Other specified postprocedural states: Secondary | ICD-10-CM | POA: Diagnosis not present

## 2022-02-17 DIAGNOSIS — M23006 Cystic meniscus, unspecified meniscus, right knee: Secondary | ICD-10-CM

## 2022-02-17 NOTE — Progress Notes (Signed)
Office Visit Note   Patient: Maria Hammond           Date of Birth: 04-04-1961           MRN: [id number] Visit Date: 02/17/2022              Requested by: Billie Ruddy, MD [place],   [id number] PCP: Billie Ruddy, MD   Assessment & Plan: Visit Diagnoses:  1. S/P right knee arthroscopy   2. Perimeniscal cyst of right knee     Plan: MRI shows a small recurrent parameniscal cyst.  There is no meniscus tear.  Stable chondromalacia.  These findings were reviewed and treatment options discussed.  Since she is actually feeling better we will just leave it alone for now.  If this gets worse she will return in and talk about treatment options.  Follow-Up Instructions: No follow-ups on file.   Orders:  No orders of the defined types were placed in this encounter.  No orders of the defined types were placed in this encounter.     Procedures: No procedures performed   Clinical Data: No additional findings.   Subjective: Chief Complaint  Patient presents with   Right Knee - Pain    HPI Maria Hammond returns today to discuss right knee MRI.  She states that her symptoms have improved.  Review of Systems   Objective: Vital Signs: LMP 12/20/2011   Physical Exam  Ortho Exam Examination of the right knee is unchanged. Specialty Comments:  No specialty comments available.  Imaging: No results found.   PMFS History: Patient Active Problem List   Diagnosis Date Noted   S/P right knee arthroscopy 02/17/2022   Perimeniscal cyst of right knee 06/09/2021   Acute medial meniscus tear, right, initial encounter 06/09/2021   Unilateral primary osteoarthritis, left knee 01/16/2018   Trigger middle finger of right hand 07/21/2015   Obesity 01/26/2012   Essential hypertension 05/19/2007   Hypothyroidism 11/13/2006   Type 2 diabetes mellitus with hyperglycemia ([place]) 11/13/2006   Hyperlipidemia 11/13/2006   Past Medical History:  Diagnosis Date    Arthritis    DIABETES MELLITUS, TYPE II 11/13/2006   History of kidney stones    HYPERLIPIDEMIA 11/13/2006   HYPERTENSION 05/19/2007   HYPOTHYROIDISM 11/13/2006   TOBACCO USE 12/16/2007   Quit 04/23/10      Family History  Problem Relation Age of Onset   Diabetes Mother    Heart disease Father    Kidney disease Father    Prostate cancer Father    Breast cancer Sister        age 44   Diabetes Brother    Diabetes Brother    Colon cancer Neg Hx    Colon polyps Neg Hx    Esophageal cancer Neg Hx    Rectal cancer Neg Hx    Stomach cancer Neg Hx     Past Surgical History:  Procedure Laterality Date   CESAREAN SECTION     COLONOSCOPY  11/11/2009   brodie   IR NEPHROSTOMY PLACEMENT RIGHT  10/15/2020   right foot bone spur surgery      TONSILLECTOMY  1970   TRIGGER FINGER RELEASE     around 2012   Social History   Occupational History   Not on file  Tobacco Use   Smoking status: Former    Packs/day: 1.00    Years: 30.00    Total pack years: 30.00    Types: Cigarettes  Quit date: 04/23/2010    Years since quitting: 11.8   Smokeless tobacco: Never  Vaping Use   Vaping Use: Never used  Substance and Sexual Activity   Alcohol use: Never   Drug use: Never   Sexual activity: Yes    Partners: Male

## 2022-03-02 ENCOUNTER — Other Ambulatory Visit: Payer: Self-pay | Admitting: Family Medicine

## 2022-03-09 DIAGNOSIS — N200 Calculus of kidney: Secondary | ICD-10-CM | POA: Diagnosis not present

## 2022-03-14 ENCOUNTER — Other Ambulatory Visit: Payer: Self-pay | Admitting: Internal Medicine

## 2022-03-14 DIAGNOSIS — E039 Hypothyroidism, unspecified: Secondary | ICD-10-CM

## 2022-03-15 ENCOUNTER — Other Ambulatory Visit: Payer: Self-pay | Admitting: Family Medicine

## 2022-04-18 ENCOUNTER — Other Ambulatory Visit: Payer: Self-pay | Admitting: Internal Medicine

## 2022-05-04 ENCOUNTER — Ambulatory Visit (INDEPENDENT_AMBULATORY_CARE_PROVIDER_SITE_OTHER): Payer: BC Managed Care – PPO | Admitting: Internal Medicine

## 2022-05-04 ENCOUNTER — Encounter: Payer: Self-pay | Admitting: Internal Medicine

## 2022-05-04 VITALS — BP 120/58 | HR 96 | Ht 63.0 in | Wt 182.0 lb

## 2022-05-04 DIAGNOSIS — E1165 Type 2 diabetes mellitus with hyperglycemia: Secondary | ICD-10-CM

## 2022-05-04 DIAGNOSIS — E039 Hypothyroidism, unspecified: Secondary | ICD-10-CM | POA: Diagnosis not present

## 2022-05-04 DIAGNOSIS — E785 Hyperlipidemia, unspecified: Secondary | ICD-10-CM

## 2022-05-04 LAB — POCT GLYCOSYLATED HEMOGLOBIN (HGB A1C): Hemoglobin A1C: 6.4 % — AB (ref 4.0–5.6)

## 2022-05-04 MED ORDER — GLIPIZIDE ER 2.5 MG PO TB24: ORAL_TABLET | ORAL | 3 refills | Status: DC

## 2022-05-04 MED ORDER — ATORVASTATIN CALCIUM 20 MG PO TABS: 10.0000 mg | ORAL_TABLET | Freq: Every day | ORAL | 3 refills | Status: DC

## 2022-05-04 NOTE — Patient Instructions (Addendum)
Please continue: - Metformin ER 1000 mg 2x a day  You may use a 2.5 mg (crushed) tablet before a large dinner.  Please continue Levothyroxine 50 mcg daily.  Take the thyroid hormone every day, with water, at least 30 minutes before breakfast, separated by at least 4 hours from: - acid reflux medications - calcium - iron - multivitamins  Please return in 4 months.

## 2022-05-04 NOTE — Progress Notes (Signed)
Patient ID: Maria Hammond, female   DOB: 1960/06/08, 62 y.o.   MRN: [id number]  HPI: Maria Hammond is a 62 y.o.-year-old female, returning for f/u for DM2, dx 2008, non-insulin-dependent, controlled, without long term complications. Last visit 4 months ago.  Interim history: No increased urination, blurry vision, nausea, chest pain. She had R foot surgery since last OV (for hammer toe - Dr. Paulla Dolly. She has kidney stones.   DM2: Reviewed HbA1c levels: Lab Results  Component Value Date   HGBA1C 6.5 (A) 01/19/2022   HGBA1C 6.4 (A) 09/15/2021   HGBA1C 6.8 (A) 05/12/2021   HGBA1C 6.8 (A) 01/06/2021   HGBA1C 7.4 (H) 11/04/2020   HGBA1C 7.1 (A) 09/02/2020   HGBA1C 6.9 (A) 04/29/2020   HGBA1C 6.5 (A) 08/27/2019   HGBA1C 6.2 (A) 02/24/2019   HGBA1C 6.5 (A) 11/06/2018   HGBA1C 6.9 (H) 08/12/2018   HGBA1C 6.1 (A) 02/25/2018   HGBA1C 6.6 (A) 10/22/2017   HGBA1C 7.7 (H) 07/19/2017   HGBA1C 7.2 03/13/2017   HGBA1C 7.0 12/11/2016   HGBA1C 7.9 (H) 07/10/2016   HGBA1C 8.1 05/29/2016   HGBA1C 6.5 01/27/2016   HGBA1C 6.3 10/25/2015  03/13/2017: HbA1c calculated from fructosamine 6.16%  Pt is on a regimen of: - Metformin ER 1000 mg 2x a day - Glipizide ER 2.5 mg in am (crushed), before b'fast +/- 2.5 mg crushed tablet before a large dinner We tried Rybelsus 08/2020 - $$$. Retried 04/2021 >> $$$. She could not afford Januvia and Tradjenta. We stopped Amaryl 2 mg in 03/2013 as her sugars were at goal and even had some lows and got lightheaded 4 h after b'fast, while at work (resolved after having a snack).   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 Highest sugars: 250 (strawberry shortcake) >> 300 >> 250.  Meter: ReliOn  Pt's meals are: - Breakfast (4 am): 3 strips [place] bacon + 1 egg + coffee - splenda and cream - snack: banana, nuts - Lunch: PB sandwich + bag of chips + yoghurt + fruit + diet Mtn  Dew - snack: fruit and nuts - Dinner: sauerkraut + sausage She was drinking up 3 gallons of milk a week! >> now almond milk  She works starting at 4 AM and has to wake up at 2 AM.  She works until approximately 12 PM.  She goes to bed around 8 PM.  -No CKD, last BUN/creatinine:  Lab Results  Component Value Date   BUN 11 05/04/2021   CREATININE 0.67 05/04/2021  On lisinopril.  -+ HL; last set of lipids: Lab Results  Component Value Date   CHOL 174 01/19/2022   HDL 63.20 01/19/2022   LDLCALC 95 01/19/2022   LDLDIRECT 66.0 07/13/2014   TRIG 78.0 01/19/2022   CHOLHDL 3 01/19/2022  On Lipitor 10  mg (did not increase to 20 mg daily as advised).  - last eye exam was on 12/2021: No DR reportedly.  Dr Lady Gary.  - no numbness and tingling in her feet. She had Sx R big toe 02/2019. She sees podiatry. She had numbness in fingertips >> resolved after B12 supplementation. She was found to have a B12 of 192 in 09/2019.  Last foot exam 05/12/2021. She had hammer toe sx (R foot) since last OV and saw Dr. Paulla Dolly several times.    Hypothyroidism:  Reviewed her TFTs: Lab Results  Component Value Date  TSH 0.92 01/19/2022   TSH 1.24 05/04/2021   TSH 0.44 11/04/2020   Pt is on levothyroxine 50 mcg daily (dose decreased 10/2019), taken: - in am - fasting - at least 30 min from b'fast - no calcium - no iron - no multivitamins - no PPIs - not on Biotin  Pt denies: - feeling nodules in neck - hoarseness - dysphagia - choking  ROS: + see HPI  I reviewed pt's medications, allergies, PMH, social hx, family hx, and changes were documented in the history of present illness. Otherwise, unchanged from my initial visit note.  Past Medical History:  Diagnosis Date   Arthritis    DIABETES MELLITUS, TYPE II 11/13/2006   History of kidney stones    HYPERLIPIDEMIA 11/13/2006   HYPERTENSION 05/19/2007   HYPOTHYROIDISM 11/13/2006   TOBACCO USE 12/16/2007   Quit 04/23/10     Past  Surgical History:  Procedure Laterality Date   CESAREAN SECTION     COLONOSCOPY  11/11/2009   brodie   IR NEPHROSTOMY PLACEMENT RIGHT  10/15/2020   right foot bone spur surgery      TONSILLECTOMY  1970   TRIGGER FINGER RELEASE     around 2012   Social History   Socioeconomic History   Marital status: Divorced    Spouse name: Not on file   Number of children: Not on file   Years of education: Not on file   Highest education level: Not on file  Occupational History   Not on file  Tobacco Use   Smoking status: Former    Packs/day: 1.00    Years: 30.00    Total pack years: 30.00    Types: Cigarettes    Quit date: 04/23/2010    Years since quitting: 12.0   Smokeless tobacco: Never  Vaping Use   Vaping Use: Never used  Substance and Sexual Activity   Alcohol use: Never   Drug use: Never   Sexual activity: Yes    Partners: Male  Other Topics Concern   Not on file  Social History Narrative   New relationship.    In process of divorce (starting 2015- still going 2018). 2 children- boy 69 autism goes to [facility] and works and girl 39 works at the jail in 2016. Son lives with her.  Husband no longer in house. Daughter in [place].       Works at [employer]- 24 years in May 2018.       Hobbies: tv, facebook/internet. Church.       Regular exercise: not at this time   Caffeine use: Diet Mt Dew   Social Determinants of Health   Financial Resource Strain: Not on file  Food Insecurity: Not on file  Transportation Needs: Not on file  Physical Activity: Not on file  Stress: Not on file  Social Connections: Not on file  Intimate Partner Violence: Not on file   Current Outpatient Medications on File Prior to Visit  Medication Sig Dispense Refill   atorvastatin (LIPITOR) 10 MG tablet Take 1 tablet by mouth once daily 90 tablet 0   Continuous Blood Gluc Sensor (FREESTYLE LIBRE 3 SENSOR) MISC APPLY 1 SENSOR EVERY 14 DAYS 6 each 3   Cyanocobalamin (VITAMIN B-12 PO) Take by mouth.      glipiZIDE (GLUCOTROL XL) 2.5 MG 24 hr tablet TAKE 1 TABLET BY MOUTH BEFORE BREAKFAST AND 1 CRUSHED TABLET BEFORE SUPPER. 180 tablet 3   HYDROcodone-acetaminophen (NORCO) 5-325 MG tablet Take 1-2 tablets by mouth 3 (three)  times daily as needed. To be taken after surgery 30 tablet 0   levothyroxine (SYNTHROID) 50 MCG tablet Take 1 tablet by mouth once daily 90 tablet 0   lisinopril (ZESTRIL) 5 MG tablet Take 1 tablet by mouth once daily 90 tablet 0   metFORMIN (GLUCOPHAGE-XR) 500 MG 24 hr tablet Take 1000 mg by mouth 2x a day 360 tablet 3   methylPREDNISolone (MEDROL DOSEPAK) 4 MG TBPK tablet As directed 21 tablet 0   ondansetron (ZOFRAN) 4 MG tablet Take 1 tablet (4 mg total) by mouth every 8 (eight) hours as needed for nausea or vomiting. 40 tablet 0   VITAMIN D PO Take by mouth.     Vitamin D, Ergocalciferol, (DRISDOL) 1.25 MG (50000 UNIT) CAPS capsule Take 1 capsule (50,000 Units total) by mouth every 7 (seven) days. 12 capsule 0   No current facility-administered medications on file prior to visit.   No Known Allergies Family History  Problem Relation Age of Onset   Diabetes Mother    Heart disease Father    Kidney disease Father    Prostate cancer Father    Breast cancer Sister        age 22   Diabetes Brother    Diabetes Brother    Colon cancer Neg Hx    Colon polyps Neg Hx    Esophageal cancer Neg Hx    Rectal cancer Neg Hx    Stomach cancer Neg Hx     PE: BP (!) 120/58 (BP Location: Left Arm, Patient Position: Sitting, Cuff Size: Normal)   Pulse 96   Ht '5\' 3"'$  (1.6 m)   Wt 182 lb (82.6 kg)   LMP 12/20/2011   SpO2 98%   BMI 32.24 kg/m    Wt Readings from Last 3 Encounters:  05/04/22 182 lb (82.6 kg)  01/19/22 178 lb 3.2 oz (80.8 kg)  09/15/21 179 lb 12.8 oz (81.6 kg)   Constitutional: overweight, in NAD Eyes:  EOMI, no exophthalmos ENT: no neck masses, no cervical lymphadenopathy Cardiovascular: tachycardia, RR, No MRG Respiratory: CTA B Musculoskeletal: no  deformities Skin:no rashes Neurological: no tremor with outstretched hands Diabetic Foot Exam - Simple   Simple Foot Form Diabetic Foot exam was performed with the following findings: Yes 05/04/2022  8:40 AM  Visual Inspection No deformities, no ulcerations, no other skin breakdown bilaterally: Yes Sensation Testing See comments: Yes Pulse Check Posterior Tibialis and Dorsalis pulse intact bilaterally: Yes Comments Decreased sensation plantar aspect of forefoot    ASSESSMENT: 1. DM2, non-insulin-dependent, controlled, without long term complications, but with occasional hyperglycemia  2. Hypothyroidism  3. HL  PLAN:  1. Patient with fairly well-controlled type 2 diabetes on metformin ER (bloating with metformin IR) and sulfonylurea.  We tried to use Rybelsus is covered by her insurance.  Also, DPP 4 inhibitors were not affordable.   -Per review of her CGM tracings at last visit, sugars were more fluctuating than before still with the vast majority of the blood sugars at goal.  She did have higher blood sugars after dinner.  Upon questioning, she was not using glipizide before a larger meal, as discussed.  I advised her to try to do so.  We are using low doses for her.  At last visit, HbA1c was 6.5%, slightly increased, but still at goal. CGM interpretation: -At today's visit, we reviewed her CGM downloads: It appears that 93% of values are in target range (goal >70%), while 7% are higher than 180 (goal <25%), and  0% are lower than 70 (goal <4%).  The calculated average blood sugar is 128.  The projected HbA1c for the next 3 months (GMI) is 6.4%. -Reviewing the CGM trends, sugars are mostly fluctuating within the target range.  She has an unusual schedule, waking up at 2 AM, eating breakfast at 3 AM and then going to work at 4 AM.  She is dropping her blood sugars after breakfast and the sugars are increasing more abruptly after her lunch around 8-9 AM therefore, I advised her to stop  glipizide in the morning.  Will continue with as needed glipizide before dinner and also with her metformin. - I suggested to:  Patient Instructions  Please continue: - Metformin ER 1000 mg 2x a day  You may use a 2.5 mg (crushed) tablet before a large dinner.  Please continue Levothyroxine 50 mcg daily.  Take the thyroid hormone every day, with water, at least 30 minutes before breakfast, separated by at least 4 hours from: - acid reflux medications - calcium - iron - multivitamins  Please return in 4 months.  - we checked her HbA1c: 6.4% (lower) - advised to check sugars at different times of the day - 4x a day, rotating check times - advised for yearly eye exams >> she is UTD - return to clinic in 4 months  2. Hypothyroidism - latest thyroid labs reviewed with pt. >> normal at last visit: Lab Results  Component Value Date   TSH 0.92 01/19/2022  - she continues on LT4 50 mcg daily - pt feels good on this dose. - we discussed about taking the thyroid hormone every day, with water, >30 minutes before breakfast, separated by >4 hours from acid reflux medications, calcium, iron, multivitamins. Pt. is taking it correctly.  3. HL -Reviewed latest lipid panel from last visit: LDL above target, otherwise fractions at goal: Lab Results  Component Value Date   CHOL 174 01/19/2022   HDL 63.20 01/19/2022   LDLCALC 95 01/19/2022   LDLDIRECT 66.0 07/13/2014   TRIG 78.0 01/19/2022   CHOLHDL 3 01/19/2022  -at last OV, I suggested to increase Lipitor to 20 mg daily - she did not do so. Will call this in now.  Philemon Kingdom, MD PhD [facility] Endocrinology

## 2022-06-09 ENCOUNTER — Other Ambulatory Visit: Payer: Self-pay | Admitting: Family Medicine

## 2022-06-17 ENCOUNTER — Other Ambulatory Visit: Payer: Self-pay | Admitting: Internal Medicine

## 2022-06-17 DIAGNOSIS — E039 Hypothyroidism, unspecified: Secondary | ICD-10-CM

## 2022-07-03 ENCOUNTER — Other Ambulatory Visit: Payer: Self-pay | Admitting: Family Medicine

## 2022-07-14 ENCOUNTER — Encounter: Payer: Self-pay | Admitting: Family Medicine

## 2022-07-14 ENCOUNTER — Ambulatory Visit (INDEPENDENT_AMBULATORY_CARE_PROVIDER_SITE_OTHER): Payer: BC Managed Care – PPO | Admitting: Family Medicine

## 2022-07-14 VITALS — BP 126/60 | HR 69 | Temp 98.5°F | Ht 63.0 in | Wt 178.6 lb

## 2022-07-14 DIAGNOSIS — E119 Type 2 diabetes mellitus without complications: Secondary | ICD-10-CM | POA: Diagnosis not present

## 2022-07-14 DIAGNOSIS — I10 Essential (primary) hypertension: Secondary | ICD-10-CM | POA: Diagnosis not present

## 2022-07-14 DIAGNOSIS — E1165 Type 2 diabetes mellitus with hyperglycemia: Secondary | ICD-10-CM

## 2022-07-14 DIAGNOSIS — E039 Hypothyroidism, unspecified: Secondary | ICD-10-CM

## 2022-07-14 DIAGNOSIS — E7841 Elevated Lipoprotein(a): Secondary | ICD-10-CM

## 2022-07-14 MED ORDER — LISINOPRIL 5 MG PO TABS: 5.0000 mg | ORAL_TABLET | Freq: Every day | ORAL | 3 refills | Status: DC

## 2022-07-14 NOTE — Telephone Encounter (Signed)
  Calling to let provider know her atorvastatin (LIPITOR) 20 MG tablet does not have lines and cannot be split, requesting a 10 mg   [place], [place]. Phone: [phone]  Fax: [phone]

## 2022-07-14 NOTE — Progress Notes (Signed)
Established Patient Office Visit   Subjective  Patient ID: Maria Hammond, female    DOB: Apr 17, 1961  Age: 62 y.o. MRN: [id number]  Chief Complaint  Patient presents with   Medication Refill    On Atorvastatin and Lisinopril   Medical Management of Chronic Issues    Pt is a 62 yo female with pmh sig for DM II, hypothyroidism, renal calculi, HTN, HLD who was seen for med refill and f/u on chronic conditions.  Seen by Endo for DM II.  A1C was 6.4% on 05/04/22.  Patient states she was seen by nephrology and urology for history of renal calculi.  Patient was unable to pass the stones and had to have surgery.  Patient also had surgery on her foot and her knee since last office visit.  Patient requesting refill on pravastatin and lisinopril.  Per chart review atorvastatin refilled by endocrinologist in January.  Patient states she is confused since pharmacy only gave her 45 pills.  Rx written for patient to take half a tab of atorvastatin 20 mg.    Medication Refill      ROS Negative unless stated above    Objective:     BP 126/60 (BP Location: Left Arm, Patient Position: Sitting, Cuff Size: Normal)   Pulse 69   Temp 98.5 F (36.9 C) (Oral)   Ht 5\' 3"  (1.6 m)   Wt 178 lb 9.6 oz (81 kg)   LMP 12/20/2011   SpO2 95%   BMI 31.64 kg/m    Physical Exam Constitutional:      General: She is not in acute distress.    Appearance: Normal appearance.  HENT:     Head: Normocephalic and atraumatic.     Nose: Nose normal.     Mouth/Throat:     Mouth: Mucous membranes are moist.  Eyes:     Extraocular Movements: Extraocular movements intact.     Conjunctiva/sclera: Conjunctivae normal.     Pupils: Pupils are equal, round, and reactive to light.  Cardiovascular:     Rate and Rhythm: Normal rate and regular rhythm.     Heart sounds: Normal heart sounds. No murmur heard.    No gallop.  Pulmonary:     Effort: Pulmonary effort is normal. No respiratory distress.     Breath sounds:  Normal breath sounds. No wheezing, rhonchi or rales.  Skin:    General: Skin is warm and dry.  Neurological:     Mental Status: She is alert and oriented to person, place, and time.      No results found for any visits on 07/14/22.    Assessment & Plan:  Essential hypertension -controlled -Continue lisinopril 5 mg daily and lifestyle modifications. -     Lisinopril; Take 1 tablet (5 mg total) by mouth daily.  Dispense: 90 tablet; Refill: 3  Type 2 diabetes mellitus with hyperglycemia, without long-term current use of insulin (HCC) -Controlled -Hemoglobin A1c 6.4% on 05/04/2022 -Continue metformin ER 1000 mg twice daily.  Can take a crushed glipizide XL 2.5 mg with a large dinner if needed. -Continue ACE I and statin. -Eye exam up-to-date -Continue follow-up with endocrinology  Elevated lipoprotein(a) -LDL 95, total cholesterol 174, triglycerides 78, HDL 63 on 01/19/2022 -LDL goal less than 70. -Per chart review endocrinology recommended increasing atorvastatin from 10 mg to 20 mg due to elevated LDL.  Refill sent in however prescription was written for patient to take half of the 20 mg tab.  Hypothyroidism -Stable -Continue Synthroid 50  mcg daily. -Continue follow-up with endocrinology  Return in about 5 months (around 12/14/2022), or if symptoms worsen or fail to improve.   Billie Ruddy, MD

## 2022-07-26 ENCOUNTER — Other Ambulatory Visit: Payer: Self-pay

## 2022-07-26 DIAGNOSIS — E785 Hyperlipidemia, unspecified: Secondary | ICD-10-CM

## 2022-07-26 DIAGNOSIS — E1165 Type 2 diabetes mellitus with hyperglycemia: Secondary | ICD-10-CM

## 2022-07-26 MED ORDER — ATORVASTATIN CALCIUM 10 MG PO TABS: 10.0000 mg | ORAL_TABLET | Freq: Every day | ORAL | 3 refills | Status: DC

## 2022-09-07 ENCOUNTER — Encounter: Payer: Self-pay | Admitting: Internal Medicine

## 2022-09-07 ENCOUNTER — Ambulatory Visit (INDEPENDENT_AMBULATORY_CARE_PROVIDER_SITE_OTHER): Payer: BC Managed Care – PPO | Admitting: Internal Medicine

## 2022-09-07 VITALS — BP 130/62 | HR 74 | Ht 63.0 in | Wt 178.0 lb

## 2022-09-07 DIAGNOSIS — Z7984 Long term (current) use of oral hypoglycemic drugs: Secondary | ICD-10-CM

## 2022-09-07 DIAGNOSIS — E1165 Type 2 diabetes mellitus with hyperglycemia: Secondary | ICD-10-CM

## 2022-09-07 DIAGNOSIS — E785 Hyperlipidemia, unspecified: Secondary | ICD-10-CM

## 2022-09-07 DIAGNOSIS — E119 Type 2 diabetes mellitus without complications: Secondary | ICD-10-CM

## 2022-09-07 DIAGNOSIS — E039 Hypothyroidism, unspecified: Secondary | ICD-10-CM

## 2022-09-07 LAB — POCT GLYCOSYLATED HEMOGLOBIN (HGB A1C): Hemoglobin A1C: 6.6 % — AB (ref 4.0–5.6)

## 2022-09-07 MED ORDER — ATORVASTATIN CALCIUM 20 MG PO TABS: 20.0000 mg | ORAL_TABLET | Freq: Every day | ORAL | 3 refills | Status: DC

## 2022-09-07 NOTE — Patient Instructions (Addendum)
Please continue: - Metformin ER 1000 mg 2x a day - Glipizide 2.5 mg (crushed) before a larger dinner  Please continue Levothyroxine 50 mcg daily.  Take the thyroid hormone every day, with water, at least 30 minutes before breakfast, separated by at least 4 hours from: - acid reflux medications - calcium - iron - multivitamins    Please increase Lipitor 20 mg daily.  Please return in 4 months.

## 2022-09-07 NOTE — Progress Notes (Signed)
Patient ID: Maria Hammond, female   DOB: 08-27-1960, 62 y.o.   MRN: [id number]  HPI: Maria Hammond is a 62 y.o.-year-old female, returning for f/u for DM2, dx 2008, non-insulin-dependent, controlled, without long term complications. Last visit 4 months ago.  Interim history: No increased urination, blurry vision, nausea, chest pain. She relaxed her diet since last visit and including more sweets.  DM2: Reviewed HbA1c levels: Lab Results  Component Value Date   HGBA1C 6.4 (A) 05/04/2022   HGBA1C 6.5 (A) 01/19/2022   HGBA1C 6.4 (A) 09/15/2021   HGBA1C 6.8 (A) 05/12/2021   HGBA1C 6.8 (A) 01/06/2021   HGBA1C 7.4 (H) 11/04/2020   HGBA1C 7.1 (A) 09/02/2020   HGBA1C 6.9 (A) 04/29/2020   HGBA1C 6.5 (A) 08/27/2019   HGBA1C 6.2 (A) 02/24/2019   HGBA1C 6.5 (A) 11/06/2018   HGBA1C 6.9 (H) 08/12/2018   HGBA1C 6.1 (A) 02/25/2018   HGBA1C 6.6 (A) 10/22/2017   HGBA1C 7.7 (H) 07/19/2017   HGBA1C 7.2 03/13/2017   HGBA1C 7.0 12/11/2016   HGBA1C 7.9 (H) 07/10/2016   HGBA1C 8.1 05/29/2016   HGBA1C 6.5 01/27/2016  03/13/2017: HbA1c calculated from fructosamine 6.16%  Pt is on a regimen of: - Metformin ER 1000 mg 2x a day - Glipizide ER 2.5 mg in am (crushed), before b'fast +/- 2.5 mg crushed tablet before a large dinner >> only before a larger dinner We tried Rybelsus 08/2020 - $$$. Retried 04/2021 >> $$$. She could not afford Januvia and Tradjenta. We stopped Amaryl 2 mg in 03/2013 as her sugars were at goal and even had some lows and got lightheaded 4 h after b'fast, while at work (resolved after having a snack).   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 Highest sugars: 250 (strawberry shortcake) >> 300 >> 250 >> 200s.  Meter: ReliOn  Pt's meals are: - Breakfast (4 am): 3 strips [place] bacon + 1 egg + coffee - splenda and cream - snack: banana, nuts - Lunch: PB sandwich + bag  of chips + yoghurt + fruit + diet Mtn Dew - snack: fruit and nuts - Dinner: sauerkraut + sausage She was drinking up 3 gallons of milk a week! >> now almond milk  She works starting at 4 AM and has to wake up at 2 AM.  She works until approximately 12 PM.  She goes to bed around 8 PM.  -No CKD, last BUN/creatinine:  Lab Results  Component Value Date   BUN 11 05/04/2021   CREATININE 0.67 05/04/2021  On lisinopril.  -+ HL; last set of lipids: Lab Results  Component Value Date   CHOL 174 01/19/2022   HDL 63.20 01/19/2022   LDLCALC 95 01/19/2022   LDLDIRECT 66.0 07/13/2014   TRIG 78.0 01/19/2022   CHOLHDL 3 01/19/2022  On Lipitor 20 mg daily.  - last eye exam was on 12/2021: No DR reportedly.  Dr Tama High.  - no numbness and tingling in her feet. She had Sx R big toe 02/2019. She sees podiatry. She had numbness in fingertips >> resolved after B12 supplementation. She was found to have a B12 of 192 in 09/2019.  Last foot exam 04/2022. She had hammer toe sx (R foot) in 2023 and saw Dr. Charlsie Merles several times.    Hypothyroidism:  Reviewed her TFTs: Lab Results  Component Value Date   TSH 0.92 01/19/2022  TSH 1.24 05/04/2021   TSH 0.44 11/04/2020   Pt is on levothyroxine 50 mcg daily (dose decreased 10/2019), taken: - in am - fasting - at least 30 min from b'fast - no calcium - no iron - no multivitamins - no PPIs - not on Biotin  Pt denies: - feeling nodules in neck - hoarseness - dysphagia - choking  ROS: + see HPI  I reviewed pt's medications, allergies, PMH, social hx, family hx, and changes were documented in the history of present illness. Otherwise, unchanged from my initial visit note.  Past Medical History:  Diagnosis Date   Arthritis    DIABETES MELLITUS, TYPE II 11/13/2006   History of kidney stones    HYPERLIPIDEMIA 11/13/2006   HYPERTENSION 05/19/2007   HYPOTHYROIDISM 11/13/2006   TOBACCO USE 12/16/2007   Quit 04/23/10     Past Surgical  History:  Procedure Laterality Date   CESAREAN SECTION     COLONOSCOPY  11/11/2009   brodie   IR NEPHROSTOMY PLACEMENT RIGHT  10/15/2020   right foot bone spur surgery      TONSILLECTOMY  1970   TRIGGER FINGER RELEASE     around 2012   Social History   Socioeconomic History   Marital status: Divorced    Spouse name: Not on file   Number of children: Not on file   Years of education: Not on file   Highest education level: Not on file  Occupational History   Not on file  Tobacco Use   Smoking status: Former    Packs/day: 1.00    Years: 30.00    Additional pack years: 0.00    Total pack years: 30.00    Types: Cigarettes    Quit date: 04/23/2010    Years since quitting: 12.3   Smokeless tobacco: Never  Vaping Use   Vaping Use: Never used  Substance and Sexual Activity   Alcohol use: Never   Drug use: Never   Sexual activity: Yes    Partners: Male  Other Topics Concern   Not on file  Social History Narrative   New relationship.    In process of divorce (starting 2015- still going 2018). 2 children- boy 67 autism goes to [employer] [facility] [employer] and works and girl 39 works at the jail in 2016. Son lives with her.  Husband no longer in house. Daughter in [place].       Works at [employer]- 24 years in May 2018.       Hobbies: tv, facebook/internet. Church.       Regular exercise: not at this time   Caffeine use: Diet Mt Dew   Social Determinants of Health   Financial Resource Strain: Not on file  Food Insecurity: Not on file  Transportation Needs: Not on file  Physical Activity: Not on file  Stress: Not on file  Social Connections: Not on file  Intimate Partner Violence: Not on file   Current Outpatient Medications on File Prior to Visit  Medication Sig Dispense Refill   atorvastatin (LIPITOR) 10 MG tablet Take 1 tablet (10 mg total) by mouth daily. 90 tablet 3   Continuous Blood Gluc Sensor (FREESTYLE LIBRE 3 SENSOR) MISC APPLY 1 SENSOR EVERY 14 DAYS 6 each 3   Cyanocobalamin (VITAMIN  B-12 PO) Take by mouth. (Patient not taking: Reported on 07/14/2022)     glipiZIDE (GLUCOTROL XL) 2.5 MG 24 hr tablet TAKE 1 TABLET BY MOUTH BEFORE SUPPER. 90 tablet 3   levothyroxine (SYNTHROID) 50 MCG tablet Take 1 tablet  by mouth once daily 90 tablet 1   lisinopril (ZESTRIL) 5 MG tablet Take 1 tablet (5 mg total) by mouth daily. 90 tablet 3   metFORMIN (GLUCOPHAGE-XR) 500 MG 24 hr tablet Take 1000 mg by mouth 2x a day 360 tablet 3   ondansetron (ZOFRAN) 4 MG tablet Take 1 tablet (4 mg total) by mouth every 8 (eight) hours as needed for nausea or vomiting. (Patient not taking: Reported on 07/14/2022) 40 tablet 0   VITAMIN D PO Take by mouth. (Patient not taking: Reported on 07/14/2022)     No current facility-administered medications on file prior to visit.   No Known Allergies Family History  Problem Relation Age of Onset   Diabetes Mother    Heart disease Father    Kidney disease Father    Prostate cancer Father    Breast cancer Sister        age 78   Diabetes Brother    Diabetes Brother    Colon cancer Neg Hx    Colon polyps Neg Hx    Esophageal cancer Neg Hx    Rectal cancer Neg Hx    Stomach cancer Neg Hx     PE: BP 130/62 (BP Location: Left Arm, Patient Position: Sitting, Cuff Size: Normal)   Pulse 74   Ht 5\' 3"  (1.6 m)   Wt 178 lb (80.7 kg)   LMP 12/20/2011   SpO2 94%   BMI 31.53 kg/m    Wt Readings from Last 3 Encounters:  09/07/22 178 lb (80.7 kg)  07/14/22 178 lb 9.6 oz (81 kg)  05/04/22 182 lb (82.6 kg)   Constitutional: overweight, in NAD Eyes:  EOMI, no exophthalmos ENT: no neck masses, no cervical lymphadenopathy Cardiovascular: tachycardia, RR, No MRG Respiratory: CTA B Musculoskeletal: no deformities Skin:no rashes Neurological: no tremor with outstretched hands  ASSESSMENT: 1. DM2, non-insulin-dependent, controlled, without long term complications, but with occasional hyperglycemia  2. Hypothyroidism  3. HL  PLAN:  1. Patient with fairly  well-controlled type 2 diabetes on metformin ER (she had bloating with metformin IR) and prn sulfonylurea before a larger meal.  Rybelsus was not covered by her insurance and DPP 4 inhibitors will also not affordable. -At last visit, sugars were mostly fluctuating within the target range.  She has an unusual schedule, waking up at 2 AM, eating breakfast at 3 AM and then going to work at 4 AM.  She was dropping her blood sugars after breakfast and the sugars were increasing more abruptly after lunch around 8 to 9 AM, so we discussed about stopping glipizide in the morning.  HbA1c at last visit was lower, at 6.4%. CGM interpretation: -At today's visit, we reviewed her CGM downloads: It appears that 94% of values are in target range (goal >70%), while 6% are higher than 180 (goal <25%), and 0% are lower than 70 (goal <4%).  The calculated average blood sugar is 125.  The projected HbA1c for the next 3 months (GMI) is 6.3%. -Reviewing the CGM trends, sugars appear to be still fluctuating within the target range, with more significant increases in blood sugars after breakfast and then after lunch, but still mostly in target.  No need to change her regimen for now.  However, upon questioning, she relaxed her diet after our last visit and she is now eating more sweets.  We did discuss about lowering the glycemic load of her meals.  She likes fruit and we discussed about the healthier foods to eat and the  ones that she should stay away from or eat small amounts, after meals only. - I suggested to:  Patient Instructions  Please continue: - Metformin ER 1000 mg 2x a day - Glipizide 2.5 mg (crushed) before a larger dinner  Please continue Levothyroxine 50 mcg daily.  Take the thyroid hormone every day, with water, at least 30 minutes before breakfast, separated by at least 4 hours from: - acid reflux medications - calcium - iron - multivitamins    Please increase Lipitor 20 mg daily.  Please return in 4  months.  - we checked her HbA1c: 6.6% (slightly higher) - advised to check sugars at different times of the day - 4x a day, rotating check times - advised for yearly eye exams >> she is UTD - return to clinic in 4 months  2. Hypothyroidism - latest thyroid labs reviewed with pt. >> normal: Lab Results  Component Value Date   TSH 0.92 01/19/2022  - she continues on LT4 50 mcg daily - pt feels good on this dose. - we discussed about taking the thyroid hormone every day, with water, >30 minutes before breakfast, separated by >4 hours from acid reflux medications, calcium, iron, multivitamins. Pt. is taking it correctly. - will check thyroid tests at next visit  3. HL -Reviewed her lipid panel from 12/2021: LDL above target, otherwise fractions at goal: Lab Results  Component Value Date   CHOL 174 01/19/2022   HDL 63.20 01/19/2022   LDLCALC 95 01/19/2022   LDLDIRECT 66.0 07/13/2014   TRIG 78.0 01/19/2022   CHOLHDL 3 01/19/2022  -At last visit, I advised her to increase her Lipitor to 20 mg daily, especially as she also has a history of a high LP(a).  However, she was not able to obtain this due to an error in the prescription.  I sent a new prescription at today's visit.  Carlus Pavlov, MD PhD [facility]

## 2022-09-11 ENCOUNTER — Other Ambulatory Visit: Payer: Self-pay | Admitting: Family Medicine

## 2022-09-11 DIAGNOSIS — Z1231 Encounter for screening mammogram for malignant neoplasm of breast: Secondary | ICD-10-CM

## 2022-12-07 ENCOUNTER — Ambulatory Visit
Admission: RE | Admit: 2022-12-07 | Discharge: 2022-12-07 | Disposition: A | Discharge status: Home / Self Care | Payer: BC Managed Care – PPO | Source: Ambulatory Visit | Attending: Family Medicine | Admitting: Family Medicine

## 2022-12-07 DIAGNOSIS — Z1231 Encounter for screening mammogram for malignant neoplasm of breast: Secondary | ICD-10-CM

## 2022-12-14 ENCOUNTER — Other Ambulatory Visit: Payer: Self-pay | Admitting: Internal Medicine

## 2022-12-14 DIAGNOSIS — E039 Hypothyroidism, unspecified: Secondary | ICD-10-CM

## 2023-01-02 IMAGING — MG MM DIGITAL SCREENING BILAT W/ TOMO AND CAD
6 of 10 series · 6 of 30 positions shown · non-contrast
Comparison: Previous exam(s).

CLINICAL DATA: Screening.

EXAM:
DIGITAL SCREENING BILATERAL MAMMOGRAM WITH TOMOSYNTHESIS AND CAD
TECHNIQUE: Bilateral screening digital craniocaudal and mediolateral oblique
mammograms were obtained. Bilateral screening digital breast
tomosynthesis was performed. The images were evaluated with
computer-aided detection.

[L MLO synth-2D]
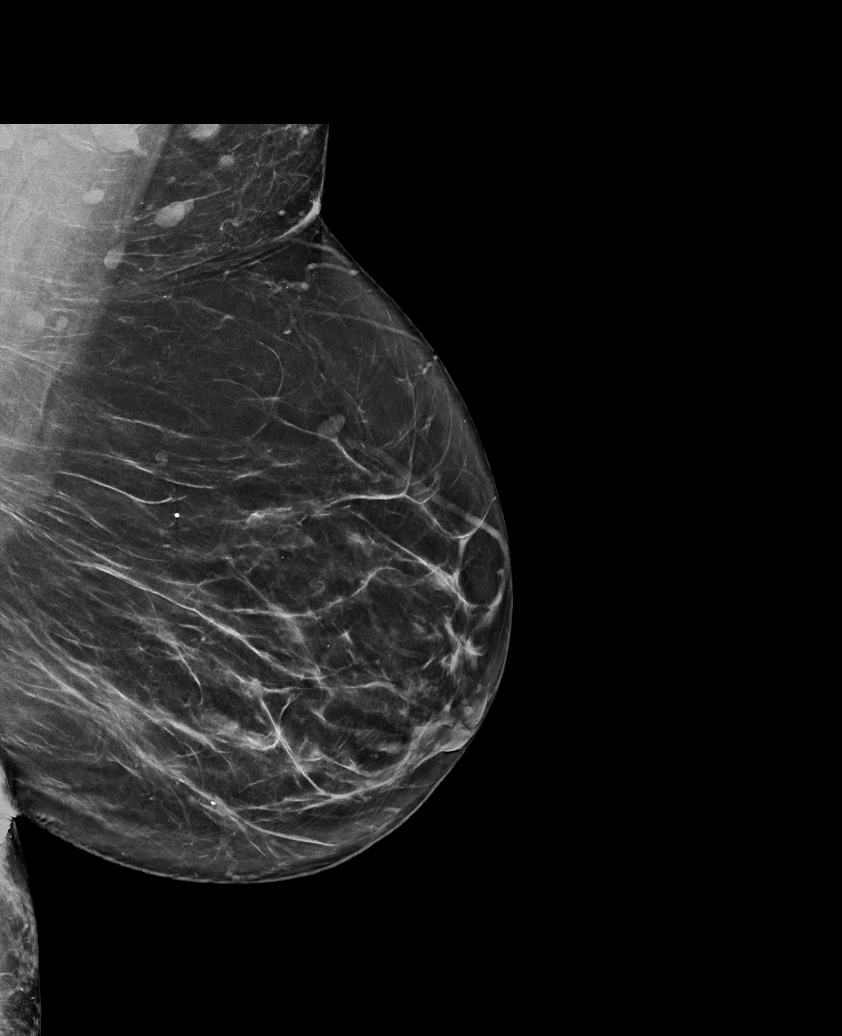

[R CC synth-2D]
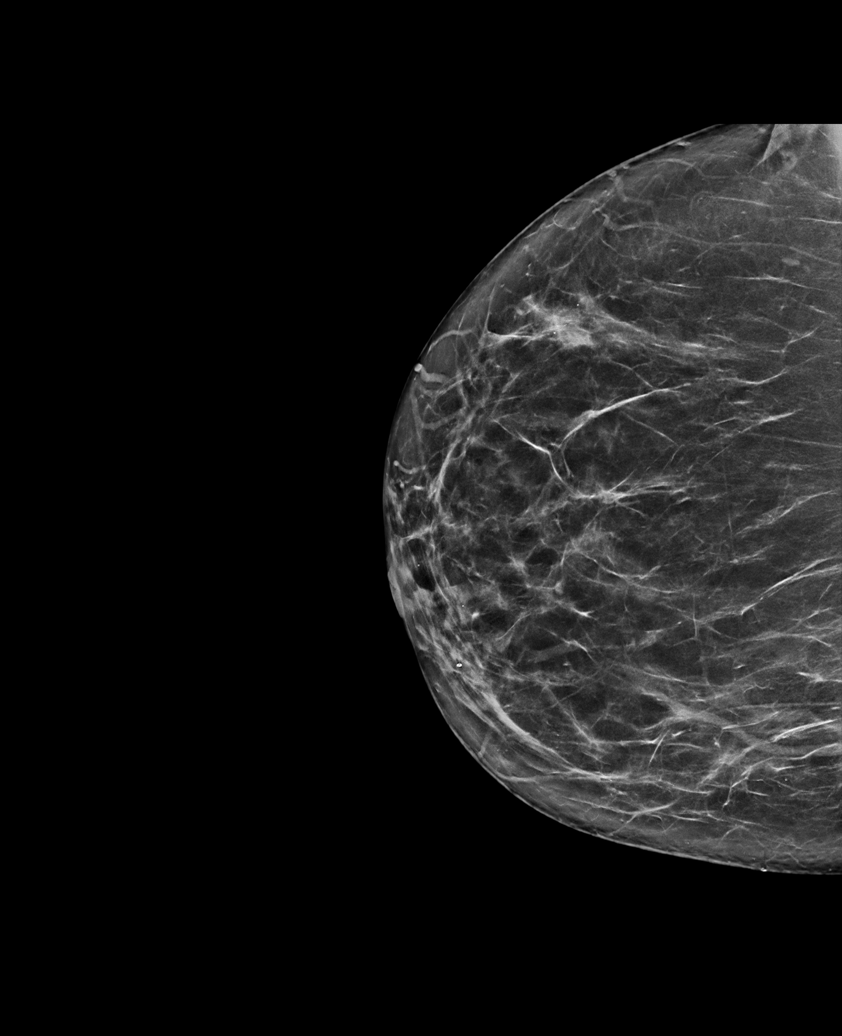

[R MLO synth-2D]
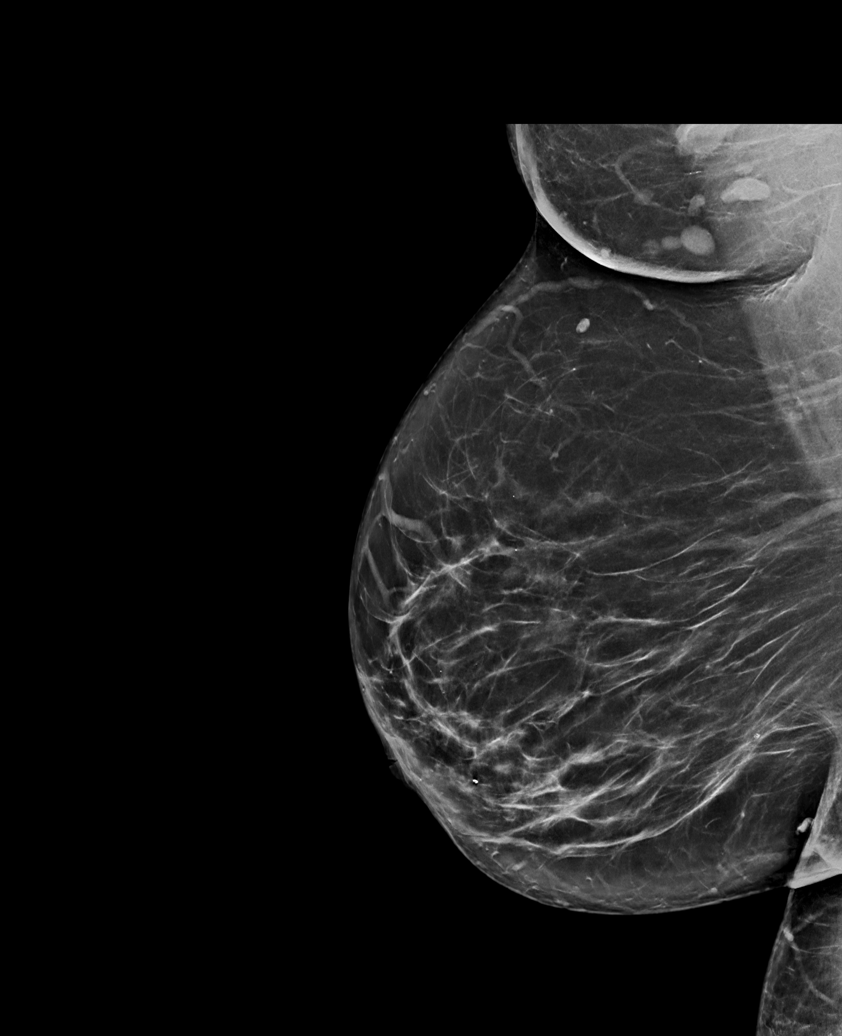

[L CC synth-2D (1 of 2)]
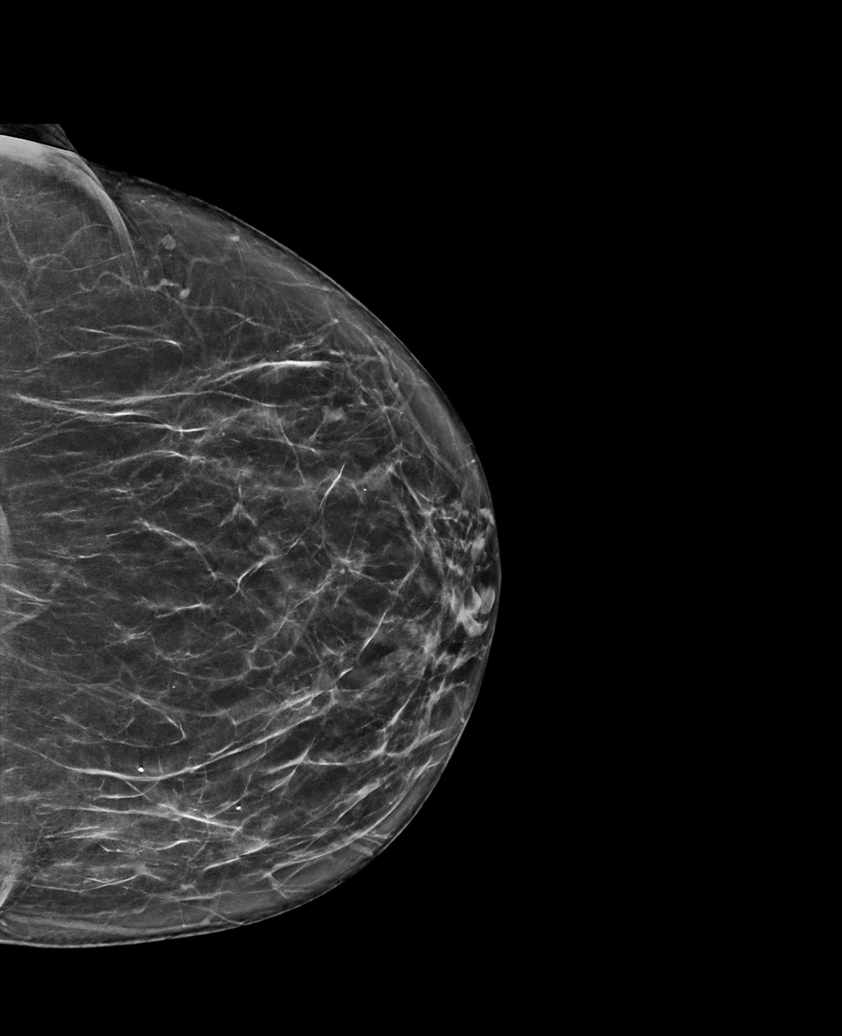

[L CC synth-2D (2 of 2)]
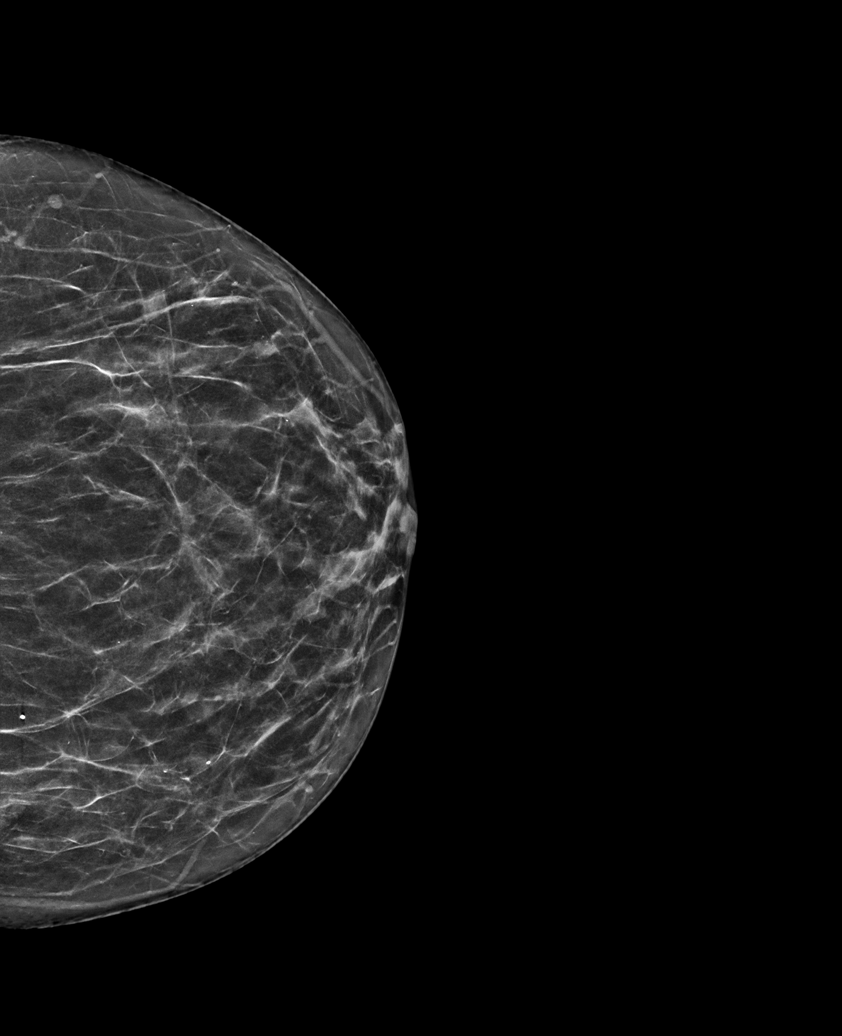

[L CC tomo · tomo slice 31/62.0]
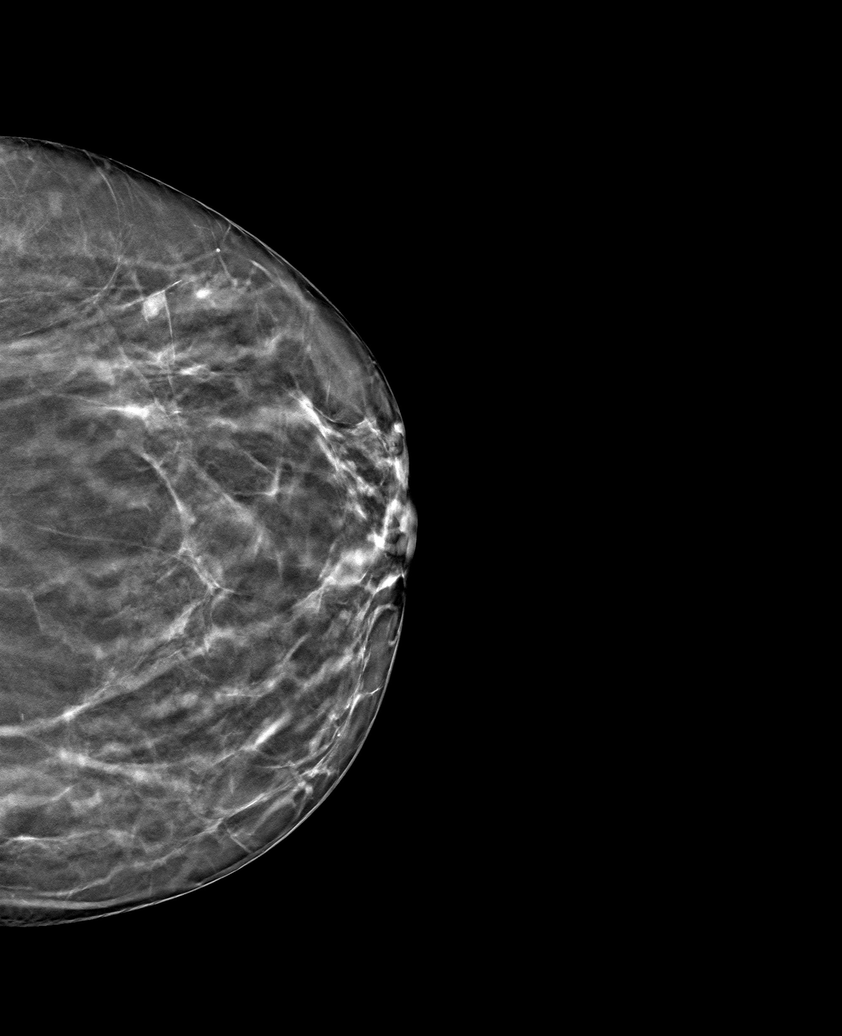

[6 of 30 positions shown; findings below may reference images not displayed]

ACR Breast Density Category b: There are scattered areas of
fibroglandular density.
FINDINGS: There are no findings suspicious for malignancy.
IMPRESSION: No mammographic evidence of malignancy. A result letter of this
screening mammogram will be mailed directly to the patient.

RECOMMENDATION:
Screening mammogram in one year. (Code:51-O-LD2)

BI-RADS CATEGORY  1: Negative.

## 2023-01-04 ENCOUNTER — Ambulatory Visit (INDEPENDENT_AMBULATORY_CARE_PROVIDER_SITE_OTHER): Payer: BC Managed Care – PPO | Admitting: Internal Medicine

## 2023-01-04 ENCOUNTER — Encounter: Payer: Self-pay | Admitting: Internal Medicine

## 2023-01-04 VITALS — BP 124/58 | HR 69 | Ht 63.0 in | Wt 178.4 lb

## 2023-01-04 DIAGNOSIS — E1165 Type 2 diabetes mellitus with hyperglycemia: Secondary | ICD-10-CM | POA: Diagnosis not present

## 2023-01-04 DIAGNOSIS — E785 Hyperlipidemia, unspecified: Secondary | ICD-10-CM | POA: Diagnosis not present

## 2023-01-04 DIAGNOSIS — E039 Hypothyroidism, unspecified: Secondary | ICD-10-CM | POA: Diagnosis not present

## 2023-01-04 DIAGNOSIS — Z7984 Long term (current) use of oral hypoglycemic drugs: Secondary | ICD-10-CM | POA: Diagnosis not present

## 2023-01-04 LAB — MICROALBUMIN / CREATININE URINE RATIO
Creatinine,U: 26.9 mg/dL
Microalb Creat Ratio: 2.6 mg/g (ref 0.0–30.0)
Microalb, Ur: <0.7 mg/dL (ref 0.0–1.9)

## 2023-01-04 LAB — T4, FREE: Free T4: 1.1 ng/dL (ref 0.60–1.60)

## 2023-01-04 LAB — TSH: TSH: 0.7 u[IU]/mL (ref 0.35–5.50)

## 2023-01-04 MED ORDER — METFORMIN HCL ER 500 MG PO TB24
ORAL_TABLET | ORAL | 3 refills | Status: DC
Start: 2023-01-04 — End: 2023-12-28

## 2023-01-04 MED ORDER — LEVOTHYROXINE SODIUM 50 MCG PO TABS
50.0000 ug | ORAL_TABLET | Freq: Every day | ORAL | 3 refills | Status: DC
Start: 2023-01-04 — End: 2024-02-11

## 2023-01-04 NOTE — Patient Instructions (Addendum)
Please continue: - Metformin ER 1000 mg 2x a day - Glipizide 2.5 mg (crushed) before a large dinner (Holiday meal)  Please continue Levothyroxine 50 mcg daily.  Take the thyroid hormone every day, with water, at least 30 minutes before breakfast, separated by at least 4 hours from: - acid reflux medications - calcium - iron - multivitamins    Please continue Lipitor 20 mg daily.  Please stop at the lab.  Please return in 4 months.

## 2023-01-04 NOTE — Progress Notes (Signed)
Patient ID: Maria Hammond, female   DOB: 1960-07-30, 62 y.o.   MRN: [id number]  HPI: Maria Hammond is a 62 y.o.-year-old female, returning for f/u for DM2, dx 2008, non-insulin-dependent, controlled, without long term complications. Last visit 4 months ago.  Interim history: No increased urination, blurry vision, nausea, chest pain.  DM2: Reviewed HbA1c levels: Lab Results  Component Value Date   HGBA1C 6.6 (A) 09/07/2022   HGBA1C 6.4 (A) 05/04/2022   HGBA1C 6.5 (A) 01/19/2022   HGBA1C 6.4 (A) 09/15/2021   HGBA1C 6.8 (A) 05/12/2021   HGBA1C 6.8 (A) 01/06/2021   HGBA1C 7.4 (H) 11/04/2020   HGBA1C 7.1 (A) 09/02/2020   HGBA1C 6.9 (A) 04/29/2020   HGBA1C 6.5 (A) 08/27/2019   HGBA1C 6.2 (A) 02/24/2019   HGBA1C 6.5 (A) 11/06/2018   HGBA1C 6.9 (H) 08/12/2018   HGBA1C 6.1 (A) 02/25/2018   HGBA1C 6.6 (A) 10/22/2017   HGBA1C 7.7 (H) 07/19/2017   HGBA1C 7.2 03/13/2017   HGBA1C 7.0 12/11/2016   HGBA1C 7.9 (H) 07/10/2016   HGBA1C 8.1 05/29/2016  03/13/2017: HbA1c calculated from fructosamine 6.16%  Pt is on a regimen of: - Metformin ER 1000 mg 2x a day - Glipizide ER 2.5 mg in am (crushed), before b'fast +/- 2.5 mg crushed tablet before a large dinner >> only before a larger dinner >> did not take this since last OV We tried Rybelsus 08/2020 - $$$. Retried 04/2021 >> $$$. She could not afford Januvia and Tradjenta. We stopped Amaryl 2 mg in 03/2013 as her sugars were at goal and even had some lows and got lightheaded 4 h after b'fast, while at work (resolved after having a snack).   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 Highest sugars: 300 >> 250 >> 200s >> 200s.  Meter: ReliOn  Pt's meals are: - Breakfast (4 am): 3 strips [place] bacon + 1 egg + coffee - splenda and cream - snack: banana, nuts - Lunch: PB sandwich + bag of chips + yoghurt + fruit + diet Mtn Dew - snack: fruit  and nuts - Dinner: sauerkraut + sausage She was drinking up 3 gallons of milk a week! >> now almond milk  She works starting at 4 AM and has to wake up at 2 AM.  She works until approximately 12 PM.  She goes to bed around 8 PM.  -No CKD, last BUN/creatinine:  Lab Results  Component Value Date   BUN 11 05/04/2021   CREATININE 0.67 05/04/2021   Lab Results  Component Value Date   MICRALBCREAT 1.8 11/06/2018   MICRALBCREAT 4.9 07/10/2016   MICRALBCREAT 3.0 07/05/2015   MICRALBCREAT 1.1 08/13/2013   MICRALBCREAT 3.7 02/10/2013   MICRALBCREAT 2.6 10/08/2012   MICRALBCREAT 13.0 10/08/2008   MICRALBCREAT 11.8 05/09/2007   MICRALBCREAT 5.0 11/08/2006  On lisinopril.  -+ HL; last set of lipids: Lab Results  Component Value Date   CHOL 174 01/19/2022   HDL 63.20 01/19/2022   LDLCALC 95 01/19/2022   LDLDIRECT 66.0 07/13/2014   TRIG 78.0 01/19/2022   CHOLHDL 3 01/19/2022  On Lipitor 20 mg daily.  - last eye exam was on 12/2021: No DR reportedly.  Dr Tama High.  She has an appointment scheduled for later this month.  - no numbness and tingling in her feet. She had Sx R big toe 02/2019. She sees podiatry. She had numbness in fingertips >>  resolved after B12 supplementation. She was found to have a B12 of 192 in 09/2019.  She had hammer toe sx (R foot) in 2023 and saw Dr. Charlsie Merles several times.  Last foot exam 05/04/2022.   Hypothyroidism:  Reviewed her TFTs: Lab Results  Component Value Date   TSH 0.92 01/19/2022   TSH 1.24 05/04/2021   TSH 0.44 11/04/2020   Pt is on levothyroxine 50 mcg daily (dose decreased 10/2019), taken: - in am - fasting - at least 1h from b'fast - no calcium - no iron - no multivitamins - no PPIs - not on Biotin  Pt denies: - feeling nodules in neck - hoarseness - dysphagia - choking  ROS: + see HPI  I reviewed pt's medications, allergies, PMH, social hx, family hx, and changes were documented in the history of present illness. Otherwise,  unchanged from my initial visit note.  Past Medical History:  Diagnosis Date   Arthritis    DIABETES MELLITUS, TYPE II 11/13/2006   History of kidney stones    HYPERLIPIDEMIA 11/13/2006   HYPERTENSION 05/19/2007   HYPOTHYROIDISM 11/13/2006   TOBACCO USE 12/16/2007   Quit 04/23/10     Past Surgical History:  Procedure Laterality Date   CESAREAN SECTION     COLONOSCOPY  11/11/2009   brodie   IR NEPHROSTOMY PLACEMENT RIGHT  10/15/2020   right foot bone spur surgery      TONSILLECTOMY  1970   TRIGGER FINGER RELEASE     around 2012   Social History   Socioeconomic History   Marital status: Divorced    Spouse name: Not on file   Number of children: Not on file   Years of education: Not on file   Highest education level: Not on file  Occupational History   Not on file  Tobacco Use   Smoking status: Former    Current packs/day: 0.00    Average packs/day: 1 pack/day for 30.0 years (30.0 ttl pk-yrs)    Types: Cigarettes    Start date: 04/23/1980    Quit date: 04/23/2010    Years since quitting: 12.7   Smokeless tobacco: Never  Vaping Use   Vaping status: Never Used  Substance and Sexual Activity   Alcohol use: Never   Drug use: Never   Sexual activity: Yes    Partners: Male  Other Topics Concern   Not on file  Social History Narrative   New relationship.    In process of divorce (starting 2015- still going 2018). 2 children- boy 27 autism goes to [facility] and works and girl 39 works at the jail in 2016. Son lives with her.  Husband no longer in house. Daughter in [place].       Works at [employer]- 24 years in May 2018.       Hobbies: tv, facebook/internet. Church.       Regular exercise: not at this time   Caffeine use: Diet Mt Dew   Social Determinants of Health   Financial Resource Strain: Not on file  Food Insecurity: Not on file  Transportation Needs: Not on file  Physical Activity: Not on file  Stress: Not on file  Social Connections: Not on file  Intimate  Partner Violence: Not on file   Current Outpatient Medications on File Prior to Visit  Medication Sig Dispense Refill   atorvastatin (LIPITOR) 20 MG tablet Take 1 tablet (20 mg total) by mouth daily. 90 tablet 3   Continuous Blood Gluc Sensor (FREESTYLE LIBRE 3 SENSOR) MISC  APPLY 1 SENSOR EVERY 14 DAYS 6 each 3   Cyanocobalamin (VITAMIN B-12 PO) Take by mouth. (Patient not taking: Reported on 07/14/2022)     glipiZIDE (GLUCOTROL XL) 2.5 MG 24 hr tablet TAKE 1 TABLET BY MOUTH BEFORE SUPPER. 90 tablet 3   levothyroxine (SYNTHROID) 50 MCG tablet Take 1 tablet by mouth once daily 90 tablet 0   lisinopril (ZESTRIL) 5 MG tablet Take 1 tablet (5 mg total) by mouth daily. 90 tablet 3   metFORMIN (GLUCOPHAGE-XR) 500 MG 24 hr tablet Take 1000 mg by mouth 2x a day 360 tablet 3   ondansetron (ZOFRAN) 4 MG tablet Take 1 tablet (4 mg total) by mouth every 8 (eight) hours as needed for nausea or vomiting. (Patient not taking: Reported on 07/14/2022) 40 tablet 0   VITAMIN D PO Take by mouth. (Patient not taking: Reported on 07/14/2022)     No current facility-administered medications on file prior to visit.   No Known Allergies Family History  Problem Relation Age of Onset   Diabetes Mother    Heart disease Father    Kidney disease Father    Prostate cancer Father    Breast cancer Sister        age 70   Diabetes Brother    Diabetes Brother    Colon cancer Neg Hx    Colon polyps Neg Hx    Esophageal cancer Neg Hx    Rectal cancer Neg Hx    Stomach cancer Neg Hx     PE: LMP 12/20/2011    Wt Readings from Last 3 Encounters:  09/07/22 178 lb (80.7 kg)  07/14/22 178 lb 9.6 oz (81 kg)  05/04/22 182 lb (82.6 kg)   Constitutional: overweight, in NAD Eyes:  EOMI, no exophthalmos ENT: no neck masses, no cervical lymphadenopathy Cardiovascular: RRR, No MRG Respiratory: CTA B Musculoskeletal: no deformities Skin:no rashes Neurological: no tremor with outstretched hands  ASSESSMENT: 1. DM2,  non-insulin-dependent, controlled, without long term complications, but with occasional hyperglycemia  2. Hypothyroidism  3. HL  PLAN:  1. Patient with fairly well-controlled type 2 diabetes, on metformin ER due to history of bloating with metformin IR and also on as needed sulfonylurea before a larger meal.  Rybelsus was not covered by her insurance and DPP 4 inhibitors were also not of ago.  At last visit, sugars were still fluctuating within the target range with more significant increases in blood sugars after breakfast and then after lunch, but still mostly within target.  We did not change her regimen at that time but she did relax the diet before the visit and was eating more sweets.  We discussed about lowering the glycemic load of her meals.  We discussed about fruit with lower glycemic index and advised her to eat the higher glycemic index fruit in small amounts, and after meals.  HbA1c at that time was slightly higher, at 6.6%. CGM interpretation: -At today's visit, we reviewed her CGM downloads: It appears that 96% of values are in target range (goal >70%), while 4% are higher than 180 (goal <25%), and 0% are lower than 70 (goal <4%).  The calculated average blood sugar is 121.  The projected HbA1c for the next 3 months (GMI) is 6.2%. -Reviewing the CGM trends, sugars are better controlled compared to previous visit, less fluctuating, with the vast majority of the blood sugars in the target range.  I am surprised that the HbA1c is not better. For now, I recommended to continue the  current regimen.  She did not take glipizide since last visit and we discussed about keeping it on her medication list and only taking it before large meal, for example holiday dinner.- I suggested to:  Patient Instructions  Please continue: - Metformin ER 1000 mg 2x a day - Glipizide 2.5 mg (crushed) before a large dinner (Holiday meal)  Please continue Levothyroxine 50 mcg daily.  Take the thyroid hormone  every day, with water, at least 30 minutes before breakfast, separated by at least 4 hours from: - acid reflux medications - calcium - iron - multivitamins    Please continue Lipitor 20 mg daily.  Please stop at the lab.  Please return in 4 months.  - we checked her HbA1c: 6.6% (stable) - advised to check sugars at different times of the day - 4x a day, rotating check times - advised for yearly eye exams >> she is UTD - return to clinic in 4 months  2. Hypothyroidism - latest thyroid labs reviewed with pt. >> normal: Lab Results  Component Value Date   TSH 0.92 01/19/2022  - she continues on LT4 50 mcg daily - pt feels good on this dose. - we discussed about taking the thyroid hormone every day, with water, >30 minutes before breakfast, separated by >4 hours from acid reflux medications, calcium, iron, multivitamins. Pt. is taking it correctly. - will check thyroid tests today: TSH and fT4 - If labs are abnormal, she will need to return for repeat TFTs in 1.5 months  3. HL -Reviewed latest lipid panel from a year ago: Fractions at goal except LDL above target: Lab Results  Component Value Date   CHOL 174 01/19/2022   HDL 63.20 01/19/2022   LDLCALC 95 01/19/2022   LDLDIRECT 66.0 07/13/2014   TRIG 78.0 01/19/2022   CHOLHDL 3 01/19/2022  -At last visit I again advised her to increase her Lipitor to 20 mg daily, especially as she also has a history of a high LP(a) -She is due for another lipid panel -she would like to have this at next visit with PCP, which is coming up.  Needs refills - LT4 - [facility].  Component     Latest Ref Rng 01/04/2023  TSH     0.35 - 5.50 uIU/mL 0.70   Microalb, Ur     0.0 - 1.9 mg/dL <4.1   Creatinine,U     mg/dL 32.4   MICROALB/CREAT RATIO     0.0 - 30.0 mg/g 2.6   T4,Free(Direct)     0.60 - 1.60 ng/dL 4.01   ACR and TFTs are normal.  Carlus Pavlov, MD PhD [facility] Endocrinology

## 2023-01-11 ENCOUNTER — Ambulatory Visit: Payer: BC Managed Care – PPO | Admitting: Internal Medicine

## 2023-01-12 ENCOUNTER — Ambulatory Visit (HOSPITAL_COMMUNITY)
Admission: EM | Admit: 2023-01-12 | Discharge: 2023-01-12 | Disposition: A | Discharge status: Home / Self Care | Payer: BC Managed Care – PPO | Attending: Family Medicine | Admitting: Family Medicine

## 2023-01-12 ENCOUNTER — Encounter (HOSPITAL_COMMUNITY): Payer: Self-pay | Admitting: Emergency Medicine

## 2023-01-12 DIAGNOSIS — M7918 Myalgia, other site: Secondary | ICD-10-CM

## 2023-01-12 DIAGNOSIS — M545 Low back pain, unspecified: Secondary | ICD-10-CM

## 2023-01-12 MED ORDER — METHYLPREDNISOLONE SODIUM SUCC 125 MG IJ SOLR
40.0000 mg | Freq: Once | INTRAMUSCULAR | Status: AC
  Administered 2023-01-12: 40 mg via INTRAMUSCULAR

## 2023-01-12 MED ORDER — KETOROLAC TROMETHAMINE 30 MG/ML IJ SOLN
30.0000 mg | Freq: Once | INTRAMUSCULAR | Status: AC
  Administered 2023-01-12: 30 mg via INTRAMUSCULAR

## 2023-01-12 MED ORDER — METHYLPREDNISOLONE SODIUM SUCC 125 MG IJ SOLR
INTRAMUSCULAR | Status: AC
  Filled 2023-01-12: qty 2

## 2023-01-12 MED ORDER — TIZANIDINE HCL 4 MG PO TABS: 4.0000 mg | ORAL_TABLET | Freq: Four times a day (QID) | ORAL | 0 refills | Status: DC | PRN

## 2023-01-12 MED ORDER — KETOROLAC TROMETHAMINE 30 MG/ML IJ SOLN
INTRAMUSCULAR | Status: AC
  Filled 2023-01-12: qty 1

## 2023-01-12 NOTE — Discharge Instructions (Addendum)
You were seen today for left back/buttocks pain.  This appears to be muscular in nature.  I have given you a shot of a pain medication today, as well as a steroid.  This may raise your blood sugar so please keep an eye.  I have sent out a muscle relaxer as well.  You may use over the counter tylenol/motrin for pain, as well as use heat/ice for pain.  Please follow up here or with your primary care provider if not improving.

## 2023-01-12 NOTE — ED Triage Notes (Signed)
Patient states she has left buttocks pain that feels stabbing and is worse with movement.

## 2023-01-12 NOTE — ED Provider Notes (Signed)
[facility]    CSN: [id number] Arrival date & time: 01/12/23  0900      History   Chief Complaint Chief Complaint  Patient presents with   Flank Pain    HPI Maria Hammond is a 62 y.o. female.    Flank Pain  Patient is here for left buttocks/SI joint pain.  That started about 2 days ago.   Pain with movement and with certain positions.   No known injury or heavy lifting.  Pain is constant.  No pain or numbness/tingling down the leg.  She did take motrin without much help.  This has had this before, but only lasted a day and went away, nothing like this.   No urinary symptoms.         Past Medical History:  Diagnosis Date   Arthritis    DIABETES MELLITUS, TYPE II 11/13/2006   History of kidney stones    HYPERLIPIDEMIA 11/13/2006   HYPERTENSION 05/19/2007   HYPOTHYROIDISM 11/13/2006   TOBACCO USE 12/16/2007   Quit 04/23/10      Patient Active Problem List   Diagnosis Date Noted   S/P right knee arthroscopy 02/17/2022   Perimeniscal cyst of right knee 06/09/2021   Acute medial meniscus tear, right, initial encounter 06/09/2021   Unilateral primary osteoarthritis, left knee 01/16/2018   Trigger middle finger of right hand 07/21/2015   Obesity 01/26/2012   Essential hypertension 05/19/2007   Hypothyroidism 11/13/2006   Type 2 diabetes mellitus with hyperglycemia (HCC) 11/13/2006   Hyperlipidemia 11/13/2006    Past Surgical History:  Procedure Laterality Date   CESAREAN SECTION     COLONOSCOPY  11/11/2009   brodie   IR NEPHROSTOMY PLACEMENT RIGHT  10/15/2020   right foot bone spur surgery      TONSILLECTOMY  1970   TRIGGER FINGER RELEASE     around 2012    OB History   No obstetric history on file.      Home Medications    Prior to Admission medications   Medication Sig Start Date End Date Taking? Authorizing Provider  atorvastatin (LIPITOR) 20 MG tablet Take 1 tablet (20 mg total) by mouth daily. 09/07/22   Carlus Pavlov,  MD  Continuous Blood Gluc Sensor (FREESTYLE LIBRE 3 SENSOR) MISC APPLY 1 SENSOR EVERY 14 DAYS 04/19/22   Carlus Pavlov, MD  glipiZIDE (GLUCOTROL XL) 2.5 MG 24 hr tablet TAKE 1 TABLET BY MOUTH BEFORE SUPPER. 05/04/22   Carlus Pavlov, MD  levothyroxine (SYNTHROID) 50 MCG tablet Take 1 tablet (50 mcg total) by mouth daily. 01/04/23   Carlus Pavlov, MD  lisinopril (ZESTRIL) 5 MG tablet Take 1 tablet (5 mg total) by mouth daily. 07/14/22   Deeann Saint, MD  metFORMIN (GLUCOPHAGE-XR) 500 MG 24 hr tablet Take 1000 mg by mouth 2x a day 01/04/23   Carlus Pavlov, MD    Family History Family History  Problem Relation Age of Onset   Diabetes Mother    Heart disease Father    Kidney disease Father    Prostate cancer Father    Breast cancer Sister        age 78   Diabetes Brother    Diabetes Brother    Colon cancer Neg Hx    Colon polyps Neg Hx    Esophageal cancer Neg Hx    Rectal cancer Neg Hx    Stomach cancer Neg Hx     Social History Social History   Tobacco Use   Smoking status: Former  Current packs/day: 0.00    Average packs/day: 1 pack/day for 30.0 years (30.0 ttl pk-yrs)    Types: Cigarettes    Start date: 04/23/1980    Quit date: 04/23/2010    Years since quitting: 12.7   Smokeless tobacco: Never  Vaping Use   Vaping status: Never Used  Substance Use Topics   Alcohol use: Never   Drug use: Never     Allergies   Patient has no known allergies.   Review of Systems Review of Systems  Constitutional: Negative.   HENT: Negative.    Respiratory: Negative.    Cardiovascular: Negative.   Gastrointestinal: Negative.   Genitourinary: Negative.  Negative for flank pain.  Musculoskeletal:  Positive for myalgias.     Physical Exam Triage Vital Signs ED Triage Vitals  Encounter Vitals Group     BP 01/12/23 0942 117/73     Systolic BP Percentile --      Diastolic BP Percentile --      Pulse Rate 01/12/23 0942 87     Resp 01/12/23 0942 16     Temp  01/12/23 0942 98.8 F (37.1 C)     Temp Source 01/12/23 0942 Oral     SpO2 01/12/23 0942 93 %     Weight --      Height --      Head Circumference --      Peak Flow --      Pain Score 01/12/23 0938 8     Pain Loc --      Pain Education --      Exclude from Growth Chart --    No data found.  Updated Vital Signs BP 117/73 (BP Location: Left Arm)   Pulse 87   Temp 98.8 F (37.1 C) (Oral)   Resp 16   LMP 12/20/2011   SpO2 93%   Visual Acuity Right Eye Distance:   Left Eye Distance:   Bilateral Distance:    Right Eye Near:   Left Eye Near:    Bilateral Near:     Physical Exam Constitutional:      Appearance: Normal appearance.  Cardiovascular:     Rate and Rhythm: Normal rate and regular rhythm.  Pulmonary:     Effort: Pulmonary effort is normal.     Breath sounds: Normal breath sounds.  Musculoskeletal:     Comments: +TTP to the left SI joint and upper buttocks;  decreased ROM due to pain  Neurological:     General: No focal deficit present.     Mental Status: She is alert.  Psychiatric:        Mood and Affect: Mood normal.      UC Treatments / Results  Labs (all labs ordered are listed, but only abnormal results are displayed) Labs Reviewed  POCT URINALYSIS DIP (MANUAL ENTRY)    EKG   Radiology No results found.  Procedures Procedures (including critical care time)  Medications Ordered in UC Medications  ketorolac (TORADOL) 30 MG/ML injection 30 mg (has no administration in time range)  methylPREDNISolone sodium succinate (SOLU-MEDROL) 125 mg/2 mL injection 40 mg (has no administration in time range)    Initial Impression / Assessment and Plan / UC Course  I have reviewed the triage vital signs and the nursing notes.  Pertinent labs & imaging results that were available during my care of the patient were reviewed by me and considered in my medical decision making (see chart for details).    Final Clinical Impressions(s) / UC  Diagnoses    Final diagnoses:  Acute left-sided low back pain without sciatica  Left buttock pain     Discharge Instructions      You were seen today for left back/buttocks pain.  This appears to be muscular in nature.  I have given you a shot of a pain medication today, as well as a steroid.  This may raise your blood sugar so please keep an eye.  I have sent out a muscle relaxer as well.  You may use over the counter tylenol/motrin for pain, as well as use heat/ice for pain.  Please follow up here or with your primary care provider if not improving.     ED Prescriptions     Medication Sig Dispense Auth. Provider   tiZANidine (ZANAFLEX) 4 MG tablet Take 1 tablet (4 mg total) by mouth every 6 (six) hours as needed for muscle spasms. 30 tablet Jannifer Franklin, MD      PDMP not reviewed this encounter.   Jannifer Franklin, MD 01/12/23 1014

## 2023-02-01 ENCOUNTER — Ambulatory Visit (INDEPENDENT_AMBULATORY_CARE_PROVIDER_SITE_OTHER): Payer: BC Managed Care – PPO | Admitting: Family Medicine

## 2023-02-01 ENCOUNTER — Encounter: Payer: Self-pay | Admitting: Family Medicine

## 2023-02-01 ENCOUNTER — Other Ambulatory Visit (INDEPENDENT_AMBULATORY_CARE_PROVIDER_SITE_OTHER): Payer: BC Managed Care – PPO

## 2023-02-01 VITALS — BP 120/72 | HR 74 | Temp 98.4°F | Ht 63.0 in | Wt 173.6 lb

## 2023-02-01 DIAGNOSIS — Z7984 Long term (current) use of oral hypoglycemic drugs: Secondary | ICD-10-CM

## 2023-02-01 DIAGNOSIS — Z122 Encounter for screening for malignant neoplasm of respiratory organs: Secondary | ICD-10-CM

## 2023-02-01 DIAGNOSIS — H6123 Impacted cerumen, bilateral: Secondary | ICD-10-CM

## 2023-02-01 DIAGNOSIS — Z0000 Encounter for general adult medical examination without abnormal findings: Secondary | ICD-10-CM

## 2023-02-01 DIAGNOSIS — Z87891 Personal history of nicotine dependence: Secondary | ICD-10-CM

## 2023-02-01 DIAGNOSIS — E1165 Type 2 diabetes mellitus with hyperglycemia: Secondary | ICD-10-CM

## 2023-02-01 DIAGNOSIS — E782 Mixed hyperlipidemia: Secondary | ICD-10-CM

## 2023-02-01 DIAGNOSIS — I10 Essential (primary) hypertension: Secondary | ICD-10-CM | POA: Diagnosis not present

## 2023-02-01 DIAGNOSIS — L0232 Furuncle of buttock: Secondary | ICD-10-CM

## 2023-02-01 DIAGNOSIS — E039 Hypothyroidism, unspecified: Secondary | ICD-10-CM

## 2023-02-01 LAB — CBC WITH DIFFERENTIAL/PLATELET
Basophils Absolute: 0 10*3/uL (ref 0.0–0.1)
Basophils Relative: 0.6 % (ref 0.0–3.0)
Eosinophils Absolute: 0.1 10*3/uL (ref 0.0–0.7)
Eosinophils Relative: 1.9 % (ref 0.0–5.0)
HCT: 42.6 % (ref 36.0–46.0)
Hemoglobin: 13.7 g/dL (ref 12.0–15.0)
Lymphocytes Relative: 28.3 % (ref 12.0–46.0)
Lymphs Abs: 2.2 10*3/uL (ref 0.7–4.0)
MCHC: 32.3 g/dL (ref 30.0–36.0)
MCV: 85.6 fL (ref 78.0–100.0)
Monocytes Absolute: 0.7 10*3/uL (ref 0.1–1.0)
Monocytes Relative: 9.7 % (ref 3.0–12.0)
Neutro Abs: 4.5 10*3/uL (ref 1.4–7.7)
Neutrophils Relative %: 59.5 % (ref 43.0–77.0)
Platelets: 270 10*3/uL (ref 150.0–400.0)
RBC: 4.97 Mil/uL (ref 3.87–5.11)
RDW: 14.5 % (ref 11.5–15.5)
WBC: 7.6 10*3/uL (ref 4.0–10.5)

## 2023-02-01 LAB — COMPREHENSIVE METABOLIC PANEL
ALT: 13 U/L (ref 0–35)
AST: 10 U/L (ref 0–37)
Albumin: 4.1 g/dL (ref 3.5–5.2)
Alkaline Phosphatase: 87 U/L (ref 39–117)
BUN: 8 mg/dL (ref 6–23)
CO2: 29 meq/L (ref 19–32)
Calcium: 9.6 mg/dL (ref 8.4–10.5)
Chloride: 105 meq/L (ref 96–112)
Creatinine, Ser: 0.55 mg/dL (ref 0.40–1.20)
GFR: 96.83 mL/min (ref >60.00)
Glucose, Bld: 103 mg/dL — ABNORMAL HIGH (ref 70–99)
Potassium: 4.1 meq/L (ref 3.5–5.1)
Sodium: 140 meq/L (ref 135–145)
Total Bilirubin: 0.6 mg/dL (ref 0.2–1.2)
Total Protein: 7.1 g/dL (ref 6.0–8.3)

## 2023-02-01 LAB — LIPID PANEL
Cholesterol: 169 mg/dL (ref 0–200)
HDL: 72.7 mg/dL (ref >39.00)
LDL Cholesterol: 86 mg/dL (ref 0–99)
NonHDL: 96.08
Total CHOL/HDL Ratio: 2
Triglycerides: 49 mg/dL (ref 0.0–149.0)
VLDL: 9.8 mg/dL (ref 0.0–40.0)

## 2023-02-01 LAB — HEMOGLOBIN A1C: Hgb A1c MFr Bld: 7.1 % — ABNORMAL HIGH (ref 4.6–6.5)

## 2023-02-01 NOTE — Progress Notes (Signed)
Established Patient Office Visit   Subjective  Patient ID: Maria Hammond, female    DOB: 1960-11-17  Age: 62 y.o. MRN: [id number]  Chief Complaint  Patient presents with   Annual Exam    Pt is a 62 yo female seen for CPE.  Pt states she has been doing well.  Staying busy due to work.  Pt had eye exam at [facility].  Notes a bump, possibly a boil in gluteal cleft x 1-2 months.  Not tender unless she messes with it.      Patient Active Problem List   Diagnosis Date Noted   S/P right knee arthroscopy 02/17/2022   Perimeniscal cyst of right knee 06/09/2021   Acute medial meniscus tear, right, initial encounter 06/09/2021   Unilateral primary osteoarthritis, left knee 01/16/2018   Trigger middle finger of right hand 07/21/2015   Obesity 01/26/2012   Essential hypertension 05/19/2007   Hypothyroidism 11/13/2006   Type 2 diabetes mellitus with hyperglycemia (HCC) 11/13/2006   Hyperlipidemia 11/13/2006   Past Medical History:  Diagnosis Date   Arthritis    DIABETES MELLITUS, TYPE II 11/13/2006   History of kidney stones    HYPERLIPIDEMIA 11/13/2006   HYPERTENSION 05/19/2007   HYPOTHYROIDISM 11/13/2006   TOBACCO USE 12/16/2007   Quit 04/23/10     Past Surgical History:  Procedure Laterality Date   CESAREAN SECTION     COLONOSCOPY  11/11/2009   brodie   IR NEPHROSTOMY PLACEMENT RIGHT  10/15/2020   right foot bone spur surgery      TONSILLECTOMY  1970   TRIGGER FINGER RELEASE     around 2012   Social History   Tobacco Use   Smoking status: Former    Current packs/day: 0.00    Average packs/day: 1 pack/day for 30.0 years (30.0 ttl pk-yrs)    Types: Cigarettes    Start date: 04/23/1980    Quit date: 04/23/2010    Years since quitting: 12.7   Smokeless tobacco: Never  Vaping Use   Vaping status: Never Used  Substance Use Topics   Alcohol use: Never   Drug use: Never   Family History  Problem Relation Age of Onset   Diabetes Mother    Heart disease Father     Kidney disease Father    Prostate cancer Father    Breast cancer Sister        age 67   Diabetes Brother    Diabetes Brother    Colon cancer Neg Hx    Colon polyps Neg Hx    Esophageal cancer Neg Hx    Rectal cancer Neg Hx    Stomach cancer Neg Hx    No Known Allergies    ROS Negative unless stated above    Objective:     BP 120/72 (BP Location: Left Arm, Patient Position: Sitting, Cuff Size: Normal)   Pulse 74   Temp 98.4 F (36.9 C) (Oral)   Ht 5\' 3"  (1.6 m)   Wt 173 lb 9.6 oz (78.7 kg)   LMP 12/20/2011   SpO2 96%   BMI 30.75 kg/m  BP Readings from Last 3 Encounters:  02/01/23 120/72  01/12/23 117/73  01/04/23 (!) 124/58   Wt Readings from Last 3 Encounters:  02/01/23 173 lb 9.6 oz (78.7 kg)  01/04/23 178 lb 6.4 oz (80.9 kg)  09/07/22 178 lb (80.7 kg)      Physical Exam Constitutional:      Appearance: Normal appearance.  HENT:  Head: Normocephalic and atraumatic.     Right Ear: Tympanic membrane, ear canal and external ear normal. There is impacted cerumen.     Left Ear: Tympanic membrane, ear canal and external ear normal. There is impacted cerumen.     Nose: Nose normal.     Mouth/Throat:     Mouth: Mucous membranes are moist.     Pharynx: No oropharyngeal exudate or posterior oropharyngeal erythema.  Eyes:     General: No scleral icterus.    Extraocular Movements: Extraocular movements intact.     Conjunctiva/sclera: Conjunctivae normal.     Pupils: Pupils are equal, round, and reactive to light.  Neck:     Thyroid: No thyromegaly.  Cardiovascular:     Rate and Rhythm: Normal rate and regular rhythm.     Pulses: Normal pulses.     Heart sounds: Normal heart sounds. No murmur heard.    No friction rub.  Pulmonary:     Effort: Pulmonary effort is normal.     Breath sounds: Normal breath sounds. No wheezing, rhonchi or rales.  Abdominal:     General: Bowel sounds are normal.     Palpations: Abdomen is soft.     Tenderness: There is no  abdominal tenderness.  Musculoskeletal:        General: No deformity. Normal range of motion.  Lymphadenopathy:     Cervical: No cervical adenopathy.  Skin:    General: Skin is warm and dry.     Findings: No lesion.  Neurological:     General: No focal deficit present.     Mental Status: She is alert and oriented to person, place, and time.  Psychiatric:        Mood and Affect: Mood normal.        Thought Content: Thought content normal.       02/01/2023    9:58 AM 07/14/2022    2:01 PM 05/04/2021    4:34 PM  Depression screen PHQ 2/9  Decreased Interest 0 0 2  Down, Depressed, Hopeless 0 0 1  PHQ - 2 Score 0 0 3  Altered sleeping 0  2  Tired, decreased energy 1  2  Change in appetite 0  0  Feeling bad or failure about yourself  0  0  Trouble concentrating 0  0  Moving slowly or fidgety/restless 0  0  Suicidal thoughts 0  0  PHQ-9 Score 1  7  Difficult doing work/chores Not difficult at all        02/01/2023    9:58 AM 11/04/2020    9:29 AM  GAD 7 : Generalized Anxiety Score  Nervous, Anxious, on Edge 0 0  Control/stop worrying 0 0  Worry too much - different things 0 0  Trouble relaxing 0 0  Restless 0 0  Easily annoyed or irritable 0 0  Afraid - awful might happen 0 0  Total GAD 7 Score 0 0  Anxiety Difficulty Not difficult at all       No results found for any visits on 02/01/23.    Assessment & Plan:  Well adult exam  Essential hypertension -     CBC with Differential/Platelet; Future -     Comprehensive metabolic panel; Future  Type 2 diabetes mellitus with hyperglycemia, without long-term current use of insulin (HCC) -     Hemoglobin A1c; Future  Impacted cerumen, bilateral  Boil of buttock  Acquired hypothyroidism  Mixed hyperlipidemia -     Lipid panel; Future -  Comprehensive metabolic panel; Future  Former smoker -     CT CHEST LUNG CANCER SCREENING LOW DOSE WO CONTRAST; Future  Encounter for screening for lung cancer -     CT  CHEST LUNG CANCER SCREENING LOW DOSE WO CONTRAST; Future  Age-appropriate health screenings discussed.  Lab not available in clinic.  Will proceed to [facility] office to have labs drawn.  Immunizations reviewed.  Colonoscopy done 01/08/2020.  Mammogram done 12/07/2022.  Eye exam done at my eye doctor.  BP controlled.  Continue lisinopril 5 mg.  Sitz bath for boil buttock as needed.  Discussed I&D.  Patient to decide.  Continue Synthroid 50 mcg daily hypothyroidism.  Continue follow-up with endocrinology.  Continue Lipitor 20 mg daily for HLD.  Low-dose CT for lung cancer screening ordered given history of former smoker, 30 pack years.  Return in about 1 year (around 02/01/2024), or if symptoms worsen or fail to improve, for physical.   Deeann Saint, MD

## 2023-02-13 ENCOUNTER — Other Ambulatory Visit (INDEPENDENT_AMBULATORY_CARE_PROVIDER_SITE_OTHER): Payer: BC Managed Care – PPO

## 2023-02-13 ENCOUNTER — Ambulatory Visit (INDEPENDENT_AMBULATORY_CARE_PROVIDER_SITE_OTHER): Payer: BC Managed Care – PPO | Admitting: Orthopaedic Surgery

## 2023-02-13 ENCOUNTER — Encounter: Payer: Self-pay | Admitting: Orthopaedic Surgery

## 2023-02-13 DIAGNOSIS — M25561 Pain in right knee: Secondary | ICD-10-CM

## 2023-02-13 DIAGNOSIS — G8929 Other chronic pain: Secondary | ICD-10-CM | POA: Diagnosis not present

## 2023-02-13 NOTE — Progress Notes (Signed)
Office Visit Note   Patient: Maria Hammond           Date of Birth: [birth year]           MRN: [id number] Visit Date: 02/13/2023              Requested by: Deeann Saint, MD [address] [place],  [place] [address] PCP: Deeann Saint, MD   Assessment & Plan: Visit Diagnoses:  1. Chronic pain of right knee     Plan: Maria Hammond is a 62 year old female with recurrent right knee pain.  Can't say if this is due to the cyst or actually from OA, more likely the OA.  Had an MRI a year ago to evaluate the cyst and it showed a small recurrent cyst without recurrent meniscus tear.  For now, recommend symptomatic treatment of the OA.  Follow-Up Instructions: No follow-ups on file.   Orders:  Orders Placed This Encounter  Procedures   XR KNEE 3 VIEW RIGHT   No orders of the defined types were placed in this encounter.     Procedures: No procedures performed   Clinical Data: No additional findings.   Subjective: Chief Complaint  Patient presents with   Right Knee - Pain    HPI Maria Hammond returns today for right knee pain.  She has had some consistent pain in her knee that is sharp at times and achy at times for the last 2 months.  Feels like it wants to give way.  Denies any injuries.  Has a history of parameniscal cyst and a medial meniscal tear that I scoped last year.  She then had a recurrence of the meniscal cyst and we did an MRI about a year ago.  Clinically she was doing well at that time and she did not want any intervention.  Review of Systems   Objective: Vital Signs: LMP 12/20/2011   Physical Exam  Ortho Exam Exam of the right knee shows fully healed surgical scars.  She has a small mass overlying the recurrent cyst.  There is no signs of infection.  No joint effusion. Specialty Comments:  No specialty comments available.  Imaging: XR KNEE 3 VIEW RIGHT  Result Date: 02/13/2023 X-rays of the right knee show no acute or structural abnormalities.  Mild  osteoarthritis and periarticular spurring.    PMFS History: Patient Active Problem List   Diagnosis Date Noted   S/P right knee arthroscopy 02/17/2022   Perimeniscal cyst of right knee 06/09/2021   Acute medial meniscus tear, right, initial encounter 06/09/2021   Unilateral primary osteoarthritis, left knee 01/16/2018   Trigger middle finger of right hand 07/21/2015   Obesity 01/26/2012   Essential hypertension 05/19/2007   Hypothyroidism 11/13/2006   Type 2 diabetes mellitus with hyperglycemia (HCC) 11/13/2006   Hyperlipidemia 11/13/2006   Past Medical History:  Diagnosis Date   Arthritis    DIABETES MELLITUS, TYPE II 11/13/2006   History of kidney stones    HYPERLIPIDEMIA 11/13/2006   HYPERTENSION 05/19/2007   HYPOTHYROIDISM 11/13/2006   TOBACCO USE 12/16/2007   Quit 04/23/10      Family History  Problem Relation Age of Onset   Diabetes Mother    Heart disease Father    Kidney disease Father    Prostate cancer Father    Breast cancer Sister        age 49   Diabetes Brother    Diabetes Brother    Colon cancer Neg Hx  Colon polyps Neg Hx    Esophageal cancer Neg Hx    Rectal cancer Neg Hx    Stomach cancer Neg Hx     Past Surgical History:  Procedure Laterality Date   CESAREAN SECTION     COLONOSCOPY  11/11/2009   brodie   IR NEPHROSTOMY PLACEMENT RIGHT  10/15/2020   right foot bone spur surgery      TONSILLECTOMY  1970   TRIGGER FINGER RELEASE     around 2012   Social History   Occupational History   Not on file  Tobacco Use   Smoking status: Former    Current packs/day: 0.00    Average packs/day: 1 pack/day for 30.0 years (30.0 ttl pk-yrs)    Types: Cigarettes    Start date: 04/23/1980    Quit date: 04/23/2010    Years since quitting: 12.8   Smokeless tobacco: Never  Vaping Use   Vaping status: Never Used  Substance and Sexual Activity   Alcohol use: Never   Drug use: Never   Sexual activity: Yes    Partners: Male

## 2023-02-14 ENCOUNTER — Ambulatory Visit (INDEPENDENT_AMBULATORY_CARE_PROVIDER_SITE_OTHER): Payer: BC Managed Care – PPO

## 2023-02-14 ENCOUNTER — Encounter: Payer: Self-pay | Admitting: Podiatry

## 2023-02-14 ENCOUNTER — Ambulatory Visit (INDEPENDENT_AMBULATORY_CARE_PROVIDER_SITE_OTHER): Payer: BC Managed Care – PPO | Admitting: Podiatry

## 2023-02-14 VITALS — Ht 63.0 in | Wt 173.0 lb

## 2023-02-14 DIAGNOSIS — Z9889 Other specified postprocedural states: Secondary | ICD-10-CM

## 2023-02-14 DIAGNOSIS — L600 Ingrowing nail: Secondary | ICD-10-CM | POA: Diagnosis not present

## 2023-02-14 DIAGNOSIS — M778 Other enthesopathies, not elsewhere classified: Secondary | ICD-10-CM

## 2023-02-14 NOTE — Patient Instructions (Signed)

## 2023-02-15 ENCOUNTER — Ambulatory Visit
Admission: RE | Admit: 2023-02-15 | Discharge: 2023-02-15 | Disposition: A | Discharge status: Home / Self Care | Payer: BC Managed Care – PPO | Source: Ambulatory Visit | Attending: Family Medicine | Admitting: Family Medicine

## 2023-02-15 DIAGNOSIS — Z122 Encounter for screening for malignant neoplasm of respiratory organs: Secondary | ICD-10-CM

## 2023-02-15 DIAGNOSIS — Z87891 Personal history of nicotine dependence: Secondary | ICD-10-CM

## 2023-02-15 NOTE — Progress Notes (Signed)
Subjective:   Patient ID: Maria Hammond, female   DOB: 62 y.o.   MRN: [id number]   HPI Patient presents stating having pain in the right big toe that she thinks most of it is coming from the toenail but there may also be pain coming from the big toe joint that we operated on last year.  States the nail seems sharper with also mild pain around the lesser MPJ   ROS      Objective:  Physical Exam  Neurovascular status intact mild restriction of motion of the first MPJ right foot adequate with no crepitus of the joint with incurvated medial border right big toe sore when pressed that appears to be where the majority of the pain is coming from     Assessment:  Overall doing well from surgery mild restriction mild arthritis that was present preoperatively and ingrown toenail deformity right hallux medial border painful      Plan:  H&P reviewed I went ahead today and I recommended correction of the ingrown toenail monitoring the foot with the possibility of treatment in the future.  I did x-ray today I went ahead and discussed correction of nail I allowed her to read consent form going over alternative treatments complications patient wants surgery and I went ahead today and under sterile conditions I anesthetized the toe 60 mg like Marcaine mixture sterile prep done and using sterile instrumentation remove the medial border exposed matrix applied phenol 3 applications 30 seconds followed by alcohol lavage sterile dressing gave instructions on soaks wear dressing 24 hours take it off earlier if throbbing were to occur discuss foot issues and at this point I am hopeful that nail surgery will give her a vast amount of relief of problems  X-rays indicate moderate narrowness of the joint surface no indication of other pathology current

## 2023-04-26 ENCOUNTER — Other Ambulatory Visit: Payer: Self-pay | Admitting: Internal Medicine

## 2023-05-10 ENCOUNTER — Ambulatory Visit: Payer: BC Managed Care – PPO | Admitting: Internal Medicine

## 2023-05-10 ENCOUNTER — Encounter: Payer: Self-pay | Admitting: Internal Medicine

## 2023-05-10 VITALS — BP 120/70 | HR 75 | Ht 63.0 in | Wt 175.4 lb

## 2023-05-10 DIAGNOSIS — E039 Hypothyroidism, unspecified: Secondary | ICD-10-CM

## 2023-05-10 DIAGNOSIS — E785 Hyperlipidemia, unspecified: Secondary | ICD-10-CM

## 2023-05-10 DIAGNOSIS — Z7984 Long term (current) use of oral hypoglycemic drugs: Secondary | ICD-10-CM

## 2023-05-10 DIAGNOSIS — E1165 Type 2 diabetes mellitus with hyperglycemia: Secondary | ICD-10-CM

## 2023-05-10 LAB — POCT GLYCOSYLATED HEMOGLOBIN (HGB A1C): Hemoglobin A1C: 6.8 % — AB (ref 4.0–5.6)

## 2023-05-10 MED ORDER — GLIPIZIDE 5 MG PO TABS: 2.5000 mg | ORAL_TABLET | Freq: Every day | ORAL | 1 refills | Status: DC

## 2023-05-10 NOTE — Progress Notes (Signed)
Patient ID: Maria Hammond, female   DOB: [birth year], 63 y.o.   MRN: [id number]  HPI: Maria Hammond is a 63 y.o.-year-old female, returning for f/u for DM2, dx 2008, non-insulin-dependent, controlled, without long term complications. Last visit 4 months ago.  Interim history: No increased urination, blurry vision, nausea, chest pain.  DM2: Reviewed HbA1c levels: Lab Results  Component Value Date   HGBA1C 7.1 (H) 02/01/2023   HGBA1C 6.6 (A) 09/07/2022   HGBA1C 6.4 (A) 05/04/2022   HGBA1C 6.5 (A) 01/19/2022   HGBA1C 6.4 (A) 09/15/2021   HGBA1C 6.8 (A) 05/12/2021   HGBA1C 6.8 (A) 01/06/2021   HGBA1C 7.4 (H) 11/04/2020   HGBA1C 7.1 (A) 09/02/2020   HGBA1C 6.9 (A) 04/29/2020   HGBA1C 6.5 (A) 08/27/2019   HGBA1C 6.2 (A) 02/24/2019   HGBA1C 6.5 (A) 11/06/2018   HGBA1C 6.9 (H) 08/12/2018   HGBA1C 6.1 (A) 02/25/2018   HGBA1C 6.6 (A) 10/22/2017   HGBA1C 7.7 (H) 07/19/2017   HGBA1C 7.2 03/13/2017   HGBA1C 7.0 12/11/2016   HGBA1C 7.9 (H) 07/10/2016  03/13/2017: HbA1c calculated from fructosamine 6.16%  Pt is on a regimen of: - Metformin ER 1000 mg 2x a day - Glipizide ER 2.5 mg in am (crushed), before b'fast +/- 2.5 mg crushed tablet before a large dinner >> only before a larger dinner - stil not using thisl We tried Rybelsus 08/2020 - $$$. Retried 04/2021 >> $$$. She could not afford Januvia and Tradjenta. We stopped Amaryl 2 mg in 03/2013 as her sugars were at goal and even had some lows and got lightheaded 4 h after b'fast, while at work (resolved after having a snack).   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 Highest sugars: 300 >> ... 200s >> 298  Meter: ReliOn  Pt's meals are: - Breakfast (4 am): 3 strips [place] bacon + 1 egg + coffee - splenda and cream - snack: banana, nuts - Lunch: PB sandwich + bag of chips + yoghurt + fruit + diet Mtn Dew - snack: fruit and  nuts - Dinner: sauerkraut + sausage She was drinking up 3 gallons of milk a week! >> now almond milk  She works starting at 4 AM and has to wake up at 2 AM.  She works until approximately 12 PM.  She goes to bed around 8 PM.  -No CKD, last BUN/creatinine:  Lab Results  Component Value Date   BUN 8 02/01/2023   CREATININE 0.55 02/01/2023   Lab Results  Component Value Date   MICRALBCREAT 2.6 01/04/2023   MICRALBCREAT 1.8 11/06/2018   MICRALBCREAT 4.9 07/10/2016   MICRALBCREAT 3.0 07/05/2015   MICRALBCREAT 1.1 08/13/2013   MICRALBCREAT 3.7 02/10/2013   MICRALBCREAT 2.6 10/08/2012   MICRALBCREAT 13.0 10/08/2008   MICRALBCREAT 11.8 05/09/2007   MICRALBCREAT 5.0 11/08/2006  On lisinopril.  -+ HL; last set of lipids: Lab Results  Component Value Date   CHOL 169 02/01/2023   HDL 72.70 02/01/2023   LDLCALC 86 02/01/2023   LDLDIRECT 66.0 07/13/2014   TRIG 49.0 02/01/2023   CHOLHDL 2 02/01/2023  On Lipitor 20 mg daily.  - last eye exam was in 11/2022: No DR.  Dr Tama High.    - no numbness and tingling in her feet. She had Sx R big toe 02/2019. She sees podiatry. She had numbness in fingertips >> resolved after  B12 supplementation. She was found to have a B12 of 192 in 09/2019.  She had hammer toe sx (R foot) in 2023 and saw Dr. Charlsie Merles several times.  Last foot exam 02/14/2023 Dr Charlsie Merles.   Hypothyroidism:  Reviewed her TFTs: Lab Results  Component Value Date   TSH 0.70 01/04/2023   TSH 0.92 01/19/2022   TSH 1.24 05/04/2021   Pt is on levothyroxine 50 mcg daily (dose decreased 10/2019), taken: - in am - fasting - at least 1h from b'fast - no calcium - no iron - no multivitamins - no PPIs - not on Biotin  Pt denies: - feeling nodules in neck - hoarseness - dysphagia - choking  ROS: + see HPI  I reviewed pt's medications, allergies, PMH, social hx, family hx, and changes were documented in the history of present illness. Otherwise, unchanged from my initial visit  note.  Past Medical History:  Diagnosis Date   Arthritis    DIABETES MELLITUS, TYPE II 11/13/2006   History of kidney stones    HYPERLIPIDEMIA 11/13/2006   HYPERTENSION 05/19/2007   HYPOTHYROIDISM 11/13/2006   TOBACCO USE 12/16/2007   Quit 04/23/10     Past Surgical History:  Procedure Laterality Date   CESAREAN SECTION     COLONOSCOPY  11/11/2009   brodie   IR NEPHROSTOMY PLACEMENT RIGHT  10/15/2020   right foot bone spur surgery      TONSILLECTOMY  1970   TRIGGER FINGER RELEASE     around 2012   Social History   Socioeconomic History   Marital status: Divorced    Spouse name: Not on file   Number of children: Not on file   Years of education: Not on file   Highest education level: Not on file  Occupational History   Not on file  Tobacco Use   Smoking status: Former    Current packs/day: 0.00    Average packs/day: 1 pack/day for 30.0 years (30.0 ttl pk-yrs)    Types: Cigarettes    Start date: 04/23/1980    Quit date: 04/23/2010    Years since quitting: 13.0   Smokeless tobacco: Never  Vaping Use   Vaping status: Never Used  Substance and Sexual Activity   Alcohol use: Never   Drug use: Never   Sexual activity: Yes    Partners: Male  Other Topics Concern   Not on file  Social History Narrative   New relationship.    In process of divorce (starting 2015- still going 2018). 2 children- boy 64 autism goes to [facility] and works and girl 39 works at the jail in 2016. Son lives with her.  Husband no longer in house. Daughter in [place].       Works at [employer]- 24 years in May 2018.       Hobbies: tv, facebook/internet. Church.       Regular exercise: not at this time   Caffeine use: Diet Mt Dew   Social Drivers of Health   Financial Resource Strain: Not on file  Food Insecurity: Not on file  Transportation Needs: Not on file  Physical Activity: Not on file  Stress: Not on file  Social Connections: Not on file  Intimate Partner Violence: Not on file   Current  Outpatient Medications on File Prior to Visit  Medication Sig Dispense Refill   atorvastatin (LIPITOR) 20 MG tablet Take 1 tablet (20 mg total) by mouth daily. 90 tablet 3   Continuous Glucose Sensor (FREESTYLE LIBRE 3 SENSOR) MISC APPLY  1 SENSOR EVERY 14 DAYS 6 each 3   glipiZIDE (GLUCOTROL XL) 2.5 MG 24 hr tablet TAKE 1 TABLET BY MOUTH BEFORE SUPPER. 90 tablet 3   levothyroxine (SYNTHROID) 50 MCG tablet Take 1 tablet (50 mcg total) by mouth daily. 90 tablet 3   lisinopril (ZESTRIL) 5 MG tablet Take 1 tablet (5 mg total) by mouth daily. 90 tablet 3   metFORMIN (GLUCOPHAGE-XR) 500 MG 24 hr tablet Take 1000 mg by mouth 2x a day 360 tablet 3   tiZANidine (ZANAFLEX) 4 MG tablet Take 1 tablet (4 mg total) by mouth every 6 (six) hours as needed for muscle spasms. 30 tablet 0   No current facility-administered medications on file prior to visit.   No Known Allergies Family History  Problem Relation Age of Onset   Diabetes Mother    Heart disease Father    Kidney disease Father    Prostate cancer Father    Breast cancer Sister        age 23   Diabetes Brother    Diabetes Brother    Colon cancer Neg Hx    Colon polyps Neg Hx    Esophageal cancer Neg Hx    Rectal cancer Neg Hx    Stomach cancer Neg Hx     PE: BP 120/70   Pulse 75   Ht 5\' 3"  (1.6 m)   Wt 175 lb 6.4 oz (79.6 kg)   LMP 12/20/2011   SpO2 95%   BMI 31.07 kg/m    Wt Readings from Last 3 Encounters:  05/10/23 175 lb 6.4 oz (79.6 kg)  02/14/23 173 lb (78.5 kg)  02/01/23 173 lb 9.6 oz (78.7 kg)   Constitutional: overweight, in NAD Eyes:  EOMI, no exophthalmos ENT: no neck masses, no cervical lymphadenopathy Cardiovascular: RRR, No MRG Respiratory: CTA B Musculoskeletal: no deformities Skin:no rashes Neurological: no tremor with outstretched hands  ASSESSMENT: 1. DM2, non-insulin-dependent, controlled, without long term complications, but with occasional hyperglycemia  2. Hypothyroidism  3. HL  PLAN:  1.  Patient with very well-controlled type 2 diabetes, on metformin ER (history of bloating with metformin IR) also sulfonylurea on an as-needed basis.  Rybelsus was not covered by her insurance.  At last visit, sugars are better controlled compared to previous, less fluctuating, with the vast majority of the blood sugars at goal.  We did not change her regimen.  I was surprised that her HbA1c was not better (this was stable, at 6.6%).  I advised her to take glipizide before larger meals especially the holidays coming up, as she was not really taking this.  She had another HbA1c a month later and this was higher, at 7.1%. -We previously discussed about lowering the glycemic load of her meals.  We discussed about fruit with lower glycemic index and advised her to eat the higher glycemic index fruit in small amounts, and after meals.   CGM interpretation: -At today's visit, we reviewed her CGM downloads: It appears that 85% of values are in target range (goal >70%), while 15% are higher than 180 (goal <25%), and 0% are lower than 70 (goal <4%).  The calculated average blood sugar is 143.  The projected HbA1c for the next 3 months (GMI) is 6.7%. -Reviewing the CGM trends, sugars appear to be fluctuating in the target range, with spikes above target especially after lunch.  Upon questioning, she is eating a higher carb meal for lunch and we discussed about possibly using the glipizide dose before this  meal.  She is still not using the sulfonylurea even before larger meals.  Otherwise, we can continue with the same dose of metformin. - I suggested to:  Patient Instructions  Please continue: - Metformin ER 1000 mg 2x a day  Use: - Glipizide 2.5 mg (crushed) before lunch  Please continue Levothyroxine 50 mcg daily.  Take the thyroid hormone every day, with water, at least 30 minutes before breakfast, separated by at least 4 hours from: - acid reflux medications - calcium - iron - multivitamins    Please  continue Lipitor 20 mg daily.  Please return in 4 months.  - we checked her HbA1c: 6.8% (lower) - advised to check sugars at different times of the day - 4x a day, rotating check times - advised for yearly eye exams >> she is UTD - return to clinic in 4 months  2. Hypothyroidism - latest thyroid labs reviewed with pt. >> normal: Lab Results  Component Value Date   TSH 0.70 01/04/2023  - she continues on LT4 50 mcg daily - pt feels good on this dose. - we discussed about taking the thyroid hormone every day, with water, >30 minutes before breakfast, separated by >4 hours from acid reflux medications, calcium, iron, multivitamins. Pt. is taking it correctly.  3. HL -Reviewed latest lipid panel from 01/2023: Fractions at goal: Lab Results  Component Value Date   CHOL 169 02/01/2023   HDL 72.70 02/01/2023   LDLCALC 86 02/01/2023   LDLDIRECT 66.0 07/13/2014   TRIG 49.0 02/01/2023   CHOLHDL 2 02/01/2023  -We discussed previously about increasing Lipitor to 20 mg daily especially as she has a history of a high LP(a).  She is now taking this dose.  Carlus Pavlov, MD PhD [facility] Endocrinology

## 2023-05-10 NOTE — Patient Instructions (Addendum)
Please continue: - Metformin ER 1000 mg 2x a day  Use: - Glipizide 2.5 mg (crushed) before lunch  Please continue Levothyroxine 50 mcg daily.  Take the thyroid hormone every day, with water, at least 30 minutes before breakfast, separated by at least 4 hours from: - acid reflux medications - calcium - iron - multivitamins    Please continue Lipitor 20 mg daily.  Please return in 4 months.

## 2023-05-31 ENCOUNTER — Telehealth: Payer: Self-pay | Admitting: Internal Medicine

## 2023-05-31 MED ORDER — FREESTYLE LIBRE 3 PLUS SENSOR MISC: 1.0000 | 3 refills | Status: DC

## 2023-05-31 NOTE — Telephone Encounter (Signed)
Requested Prescriptions   Signed Prescriptions Disp Refills   Continuous Glucose Sensor (FREESTYLE LIBRE 3 PLUS SENSOR) MISC 6 each 3    Sig: Inject 1 Device into the skin continuous. Change every 15 days    Authorizing Provider: Carlus Pavlov    Ordering User: Pollie Meyer

## 2023-05-31 NOTE — Telephone Encounter (Signed)
 Patient came into office to advise that she needs an updated RX to be sent for her Free Style Libre 3 Plus.  Pharmacy is [facility] [address]

## 2023-06-26 ENCOUNTER — Other Ambulatory Visit: Payer: Self-pay | Admitting: Family Medicine

## 2023-06-26 DIAGNOSIS — I10 Essential (primary) hypertension: Secondary | ICD-10-CM

## 2023-08-30 ENCOUNTER — Other Ambulatory Visit: Payer: Self-pay | Admitting: Family Medicine

## 2023-08-30 ENCOUNTER — Ambulatory Visit: Payer: BC Managed Care – PPO | Admitting: Internal Medicine

## 2023-08-30 ENCOUNTER — Encounter: Payer: Self-pay | Admitting: Internal Medicine

## 2023-08-30 VITALS — BP 122/70 | HR 56 | Ht 63.0 in | Wt 182.6 lb

## 2023-08-30 DIAGNOSIS — Z7984 Long term (current) use of oral hypoglycemic drugs: Secondary | ICD-10-CM

## 2023-08-30 DIAGNOSIS — E1165 Type 2 diabetes mellitus with hyperglycemia: Secondary | ICD-10-CM | POA: Diagnosis not present

## 2023-08-30 DIAGNOSIS — Z1231 Encounter for screening mammogram for malignant neoplasm of breast: Secondary | ICD-10-CM

## 2023-08-30 DIAGNOSIS — E039 Hypothyroidism, unspecified: Secondary | ICD-10-CM

## 2023-08-30 DIAGNOSIS — E785 Hyperlipidemia, unspecified: Secondary | ICD-10-CM

## 2023-08-30 LAB — POCT GLYCOSYLATED HEMOGLOBIN (HGB A1C): Hemoglobin A1C: 6.9 % — AB (ref 4.0–5.6)

## 2023-08-30 MED ORDER — EMPAGLIFLOZIN 10 MG PO TABS: 10.0000 mg | ORAL_TABLET | Freq: Every day | ORAL | 3 refills | Status: DC

## 2023-08-30 MED ORDER — DEXCOM G7 SENSOR MISC: 3 refills | Status: DC

## 2023-08-30 NOTE — Patient Instructions (Addendum)
 Please continue: - Metformin  ER 1000 mg 2x a day  Try to start: - Jardiance 10 mg before b'fast  Please continue Levothyroxine  50 mcg daily.  Take the thyroid  hormone every day, with water, at least 30 minutes before breakfast, separated by at least 4 hours from: - acid reflux medications - calcium  - iron - multivitamins    Please continue Lipitor 20 mg daily.  Please return in 4 months.

## 2023-08-30 NOTE — Progress Notes (Signed)
 Patient ID: Maria Hammond, female   DOB: 10/14/1960, 63 y.o.   MRN: [id number]  HPI: Maria Hammond is a 63 y.o.-year-old female, returning for f/u for DM2, dx 2008, non-insulin-dependent, controlled, without long term complications. Last visit 4 months ago.  Interim history: No increased urination, blurry vision, nausea, chest pain.  DM2: Reviewed HbA1c levels: Lab Results  Component Value Date   HGBA1C 6.8 (A) 05/10/2023   HGBA1C 7.1 (H) 02/01/2023   HGBA1C 6.6 (A) 09/07/2022   HGBA1C 6.4 (A) 05/04/2022   HGBA1C 6.5 (A) 01/19/2022   HGBA1C 6.4 (A) 09/15/2021   HGBA1C 6.8 (A) 05/12/2021   HGBA1C 6.8 (A) 01/06/2021   HGBA1C 7.4 (H) 11/04/2020   HGBA1C 7.1 (A) 09/02/2020   HGBA1C 6.9 (A) 04/29/2020   HGBA1C 6.5 (A) 08/27/2019   HGBA1C 6.2 (A) 02/24/2019   HGBA1C 6.5 (A) 11/06/2018   HGBA1C 6.9 (H) 08/12/2018   HGBA1C 6.1 (A) 02/25/2018   HGBA1C 6.6 (A) 10/22/2017   HGBA1C 7.7 (H) 07/19/2017   HGBA1C 7.2 03/13/2017   HGBA1C 7.0 12/11/2016  03/13/2017: HbA1c calculated from fructosamine 6.16%  Pt is on a regimen of: - Metformin  ER 1000 mg 2x a day - - STILL NOT USING THIS We tried Rybelsus  08/2020 - $$$. Retried 04/2021 >> $$$. She could not afford Januvia  and Tradjenta . We stopped Amaryl  2 mg in 03/2013 as her sugars were at goal and even had some lows and got lightheaded 4 h after b'fast, while at work (resolved after having a snack).   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 Highest sugars: 300 >> ... 200s >> 298 >> 300  Meter: ReliOn  Pt's meals are: - Breakfast (4 am): 3 strips [place] bacon + 1 egg + coffee - splenda and cream - snack: banana, nuts - Lunch: PB sandwich + bag of chips + yoghurt + fruit + diet Mtn Dew - snack: fruit and nuts - Dinner: sauerkraut + sausage She was drinking up 3 gallons of milk a week! >> now almond milk  She works  starting at 4 AM and has to wake up at 2 AM.  She works until approximately 12 PM.  She goes to bed around 8 PM.  -No CKD, last BUN/creatinine:  Lab Results  Component Value Date   BUN 8 02/01/2023   CREATININE 0.55 02/01/2023   Lab Results  Component Value Date   MICRALBCREAT 2.6 01/04/2023   MICRALBCREAT 1.8 11/06/2018   MICRALBCREAT 4.9 07/10/2016   MICRALBCREAT 3.0 07/05/2015   MICRALBCREAT 1.1 08/13/2013   MICRALBCREAT 3.7 02/10/2013   MICRALBCREAT 2.6 10/08/2012   MICRALBCREAT 13.0 10/08/2008   MICRALBCREAT 11.8 05/09/2007   MICRALBCREAT 5.0 11/08/2006  On lisinopril .  -+ HL; last set of lipids: Lab Results  Component Value Date   CHOL 169 02/01/2023   HDL 72.70 02/01/2023   LDLCALC 86 02/01/2023   LDLDIRECT 66.0 07/13/2014   TRIG 49.0 02/01/2023   CHOLHDL 2 02/01/2023  On Lipitor 20 mg daily.  - last eye exam was in 11/2022: No DR.  Dr Candi Chafe.    - no numbness and tingling in her feet. She had Sx R big toe 02/2019. She sees podiatry. She had numbness in fingertips >> resolved after B12 supplementation. She was found to have a B12 of 192 in 09/2019.  She had hammer toe sx (R foot) in  2023 and saw Dr. Celia Coles several times.  Last foot exam 02/14/2023 Dr Celia Coles.   Hypothyroidism:  Reviewed her TFTs: Lab Results  Component Value Date   TSH 0.70 01/04/2023   TSH 0.92 01/19/2022   TSH 1.24 05/04/2021   Pt is on levothyroxine  50 mcg daily (dose decreased 10/2019), taken: - in am - fasting - at least 1h from b'fast - no calcium  - no iron - no multivitamins - no PPIs - not on Biotin  Pt denies: - feeling nodules in neck - hoarseness - dysphagia - choking  ROS: + see HPI  I reviewed pt's medications, allergies, PMH, social hx, family hx, and changes were documented in the history of present illness. Otherwise, unchanged from my initial visit note.  Past Medical History:  Diagnosis Date   Arthritis    DIABETES MELLITUS, TYPE II 11/13/2006   History  of kidney stones    HYPERLIPIDEMIA 11/13/2006   HYPERTENSION 05/19/2007   HYPOTHYROIDISM 11/13/2006   TOBACCO USE 12/16/2007   Quit 04/23/10     Past Surgical History:  Procedure Laterality Date   CESAREAN SECTION     COLONOSCOPY  11/11/2009   brodie   IR NEPHROSTOMY PLACEMENT RIGHT  10/15/2020   right foot bone spur surgery      TONSILLECTOMY  1970   TRIGGER FINGER RELEASE     around 2012   Social History   Socioeconomic History   Marital status: Divorced    Spouse name: Not on file   Number of children: Not on file   Years of education: Not on file   Highest education level: Not on file  Occupational History   Not on file  Tobacco Use   Smoking status: Former    Current packs/day: 0.00    Average packs/day: 1 pack/day for 30.0 years (30.0 ttl pk-yrs)    Types: Cigarettes    Start date: 04/23/1980    Quit date: 04/23/2010    Years since quitting: 13.3   Smokeless tobacco: Never  Vaping Use   Vaping status: Never Used  Substance and Sexual Activity   Alcohol use: Never   Drug use: Never   Sexual activity: Yes    Partners: Male  Other Topics Concern   Not on file  Social History Narrative   New relationship.    In process of divorce (starting 2015- still going 2018). 2 children- boy 62 autism goes to [employer] and works and girl 39 works at the jail in 2016. Son lives with her.  Husband no longer in house. Daughter in [place].       Works at [employer]- 24 years in May 2018.       Hobbies: tv, facebook/internet. Church.       Regular exercise: not at this time   Caffeine use: Diet Mt Dew   Social Drivers of Health   Financial Resource Strain: Not on file  Food Insecurity: Not on file  Transportation Needs: Not on file  Physical Activity: Not on file  Stress: Not on file  Social Connections: Not on file  Intimate Partner Violence: Not on file   Current Outpatient Medications on File Prior to Visit  Medication Sig Dispense Refill   atorvastatin  (LIPITOR) 20 MG  tablet Take 1 tablet (20 mg total) by mouth daily. 90 tablet 3   Continuous Glucose Sensor (FREESTYLE LIBRE 3 PLUS SENSOR) MISC Inject 1 Device into the skin continuous. Change every 15 days 6 each 3   glipiZIDE  (GLUCOTROL ) 5 MG tablet Take  0.5 tablets (2.5 mg total) by mouth daily before lunch. 90 tablet 1   levothyroxine  (SYNTHROID ) 50 MCG tablet Take 1 tablet (50 mcg total) by mouth daily. 90 tablet 3   lisinopril  (ZESTRIL ) 5 MG tablet Take 1 tablet by mouth once daily 90 tablet 2   metFORMIN  (GLUCOPHAGE -XR) 500 MG 24 hr tablet Take 1000 mg by mouth 2x a day 360 tablet 3   tiZANidine  (ZANAFLEX ) 4 MG tablet Take 1 tablet (4 mg total) by mouth every 6 (six) hours as needed for muscle spasms. 30 tablet 0   No current facility-administered medications on file prior to visit.   No Known Allergies Family History  Problem Relation Age of Onset   Diabetes Mother    Heart disease Father    Kidney disease Father    Prostate cancer Father    Breast cancer Sister        age 69   Diabetes Brother    Diabetes Brother    Colon cancer Neg Hx    Colon polyps Neg Hx    Esophageal cancer Neg Hx    Rectal cancer Neg Hx    Stomach cancer Neg Hx    PE: BP 122/70   Pulse (!) 56   Ht 5\' 3"  (1.6 m)   Wt 182 lb 9.6 oz (82.8 kg)   LMP 12/20/2011   SpO2 96%   BMI 32.35 kg/m    Wt Readings from Last 3 Encounters:  08/30/23 182 lb 9.6 oz (82.8 kg)  05/10/23 175 lb 6.4 oz (79.6 kg)  02/14/23 173 lb (78.5 kg)   Constitutional: overweight, in NAD Eyes:  EOMI, no exophthalmos ENT: no neck masses, no cervical lymphadenopathy Cardiovascular: RRR, No MRG Respiratory: CTA B Musculoskeletal: no deformities Skin:no rashes Neurological: no tremor with outstretched hands  ASSESSMENT: 1. DM2, non-insulin-dependent, controlled, without long term complications, but with occasional hyperglycemia  2. Hypothyroidism  3. HL  PLAN:  1. Patient with well-controlled type 2 diabetes, on metformin  ER (with  history of bloating with metformin  IR).  At last visit, I advised her to switch sulfonylurea from prn to daily before lunch, however, she tells me she is not taking it.  Of note, Rybelsus  was not covered by her insurance.  At last visit, sugars were fluctuating within the target range, with spikes above target especially after lunch so I advised her to take glipizide  before higher carb meal.  We continued the same dose of metformin .  HbA1c at last visit was 6.8%, decreased from 7.1%. -We previously discussed about lowering the glycemic load of her meals.  We discussed about fruit with lower glycemic index and advised her to eat the higher glycemic index fruit in small amounts, and after meals.   CGM interpretation: -At today's visit, we reviewed her CGM downloads: It appears that 82% of values are in target range (goal >70%), while 18% are higher than 180 (goal <25%), and 0% are lower than 70 (goal <4%).  The calculated average blood sugar is 141.  The projected HbA1c for the next 3 months (GMI) is 6.7%. -Reviewing the CGM trends, it is appear to be more fluctuating than before, with spikes up to the 300s occasionally.  Sugars increased after every meal and upon questioning, she is still not taking the glipizide  at all.  We discussed that we do need to intensify her regimen and I actually suggested an SGLT2 inhibitor.  She remembers that these were expensive for her in the past.  She agrees to try  them again.  I discussed with her about the mechanism of action and expected benefits.  I advised her to stay well-hydrated. - I suggested to:  Patient Instructions  Please continue: - Metformin  ER 1000 mg 2x a day  Try to start: - Jardiance 10 mg before b'fast  Please continue Levothyroxine  50 mcg daily.  Take the thyroid  hormone every day, with water, at least 30 minutes before breakfast, separated by at least 4 hours from: - acid reflux medications - calcium  - iron - multivitamins    Please continue  Lipitor 20 mg daily.  Please return in 4 months.  - we checked her HbA1c: 6.9% (slightly higher) - advised to check sugars at different times of the day - 4x a day, rotating check times - advised for yearly eye exams >> she is UTD - will check a urine protein today - return to clinic in 4 months  2. Hypothyroidism - latest thyroid  labs reviewed with pt. >> normal: Lab Results  Component Value Date   TSH 0.70 01/04/2023  - she continues on LT4 50 mcg daily - pt feels good on this dose. - we discussed about taking the thyroid  hormone every day, with water, >30 minutes before breakfast, separated by >4 hours from acid reflux medications, calcium , iron, multivitamins. Pt. is taking it correctly.  3. HL -She has a high LP(a) - Latest lipid panel was reviewed from 01/2023: Fractions at goal: Lab Results  Component Value Date   CHOL 169 02/01/2023   HDL 72.70 02/01/2023   LDLCALC 86 02/01/2023   LDLDIRECT 66.0 07/13/2014   TRIG 49.0 02/01/2023   CHOLHDL 2 02/01/2023  - We increase Lipitor to 20 mg daily -tolerated well  Emilie Harden, MD PhD [facility] Endocrinology

## 2023-08-31 ENCOUNTER — Encounter: Payer: Self-pay | Admitting: Internal Medicine

## 2023-08-31 LAB — MICROALBUMIN / CREATININE URINE RATIO
Creatinine, Urine: 28 mg/dL (ref 20–275)
Microalb, Ur: <0.2 mg/dL

## 2023-10-03 ENCOUNTER — Other Ambulatory Visit: Payer: Self-pay | Admitting: Internal Medicine

## 2023-10-03 DIAGNOSIS — E785 Hyperlipidemia, unspecified: Secondary | ICD-10-CM

## 2023-11-15 ENCOUNTER — Other Ambulatory Visit: Payer: Self-pay | Admitting: Adult Health

## 2023-11-15 ENCOUNTER — Ambulatory Visit: Payer: Self-pay | Admitting: *Deleted

## 2023-11-15 DIAGNOSIS — R5381 Other malaise: Secondary | ICD-10-CM

## 2023-11-15 NOTE — Telephone Encounter (Signed)
 Patient has appt 7/25 with Dr. Mercer

## 2023-11-15 NOTE — Telephone Encounter (Signed)
 Copied from CRM [id number]. Topic: Clinical - Red Word Triage >> Nov 15, 2023 10:29 AM Gennette ORN wrote: Red Word that prompted transfer to Nurse Triage: Patient is having severe back pain on left side it's been going on for 2  weeks by shoulder blade. It hurts when she walk as well. Reason for Disposition  [1] MODERATE back pain (e.g., interferes with normal activities) AND [2] present > 3 days  Answer Assessment - Initial Assessment Questions 1. ONSET: When did the pain begin? (e.g., minutes, hours, days)     I'm having severe back pain for 2 1/2 weeks now.  It's on the left side near my shoulder blade.    2. LOCATION: Where does it hurt? (upper, mid or lower back)     See above 3. SEVERITY: How bad is the pain?  (e.g., Scale 1-10; mild, moderate, or severe)     Over the last couple of days it has gotten worse I do have kidney stones.   I had an x ray and U/S done yesterday.  I do have kidney stones.   It's in my upper back.   4. PATTERN: Is the pain constant? (e.g., yes, no; constant, intermittent)      Constant 5. RADIATION: Does the pain shoot into your legs or somewhere else?     The ibuprofen does not help. 6. CAUSE:  What do you think is causing the back pain?      I don't know 7. BACK OVERUSE:  Any recent lifting of heavy objects, strenuous work or exercise?     No 8. MEDICINES: What have you taken so far for the pain? (e.g., nothing, acetaminophen , NSAIDS)     Ibuprofen 9. NEUROLOGIC SYMPTOMS: Do you have any weakness, numbness, or problems with bowel/bladder control?     My arm is starting to hurt and my neck too. 10. OTHER SYMPTOMS: Do you have any other symptoms? (e.g., fever, abdomen pain, burning with urination, blood in urine)       I did a sample and all that was clear.   I saw my kidney dr yesterday 11. PREGNANCY: Is there any chance you are pregnant? When was your last menstrual period?       N/A  Protocols used: Back Pain-A-AH FYI Only or  Action Required?: FYI only for provider.  Patient was last seen in primary care on 02/01/2023 by Mercer Clotilda SAUNDERS, MD.  Called Nurse Triage reporting Back Pain. Left upper back pain around shoulder blade.  No injuries  Symptoms began several days ago.  Interventions attempted: OTC medications: Ibuprofen not helping.  Symptoms are: gradually worsening.  Triage Disposition: See PCP When Office is Open (Within 3 Days)  Patient/caregiver understands and will follow disposition?: Yes

## 2023-11-16 ENCOUNTER — Ambulatory Visit: Admitting: Family Medicine

## 2023-11-16 ENCOUNTER — Encounter: Payer: Self-pay | Admitting: Family Medicine

## 2023-11-16 VITALS — BP 126/74 | HR 78 | Temp 99.1°F | Ht 63.0 in | Wt 176.6 lb

## 2023-11-16 DIAGNOSIS — Z13820 Encounter for screening for osteoporosis: Secondary | ICD-10-CM | POA: Diagnosis not present

## 2023-11-16 DIAGNOSIS — Z780 Asymptomatic menopausal state: Secondary | ICD-10-CM

## 2023-11-16 DIAGNOSIS — M546 Pain in thoracic spine: Secondary | ICD-10-CM | POA: Diagnosis not present

## 2023-11-16 DIAGNOSIS — S46812A Strain of other muscles, fascia and tendons at shoulder and upper arm level, left arm, initial encounter: Secondary | ICD-10-CM | POA: Diagnosis not present

## 2023-11-16 MED ORDER — PREDNISONE 10 MG PO TABS: ORAL_TABLET | ORAL | 0 refills | Status: DC

## 2023-11-16 MED ORDER — MELOXICAM 7.5 MG PO TABS
7.5000 mg | ORAL_TABLET | Freq: Every day | ORAL | 0 refills | Status: DC
Start: 2023-11-16 — End: 2024-01-03

## 2023-11-16 NOTE — Progress Notes (Signed)
 Established Patient Office Visit   Subjective  Patient ID: Maria Hammond, female    DOB: 11-26-1960  Age: 63 y.o. MRN: [id number]  Chief Complaint  Patient presents with   Acute Visit    Patient came in today for left side low-mid Back pain, started 2 1/2 weeks ago, Burning sharp pain, rate of pain 6 out of 10,     Pt is a 63 yo female seen for acute concern.  Pt with L sided mid back pain, deep x 2 wks.  Noted as sharp, burning sensation, 6/10 discomfort.  Feels like a cutting  burning sensation.  Denies spasms, injury, UE or LE weakness.  Pt lifts ~20 lbs at work while turning to place items in another location.  Ibuprofen not helping.  Also tried heat.  Feels better today.  Better in shower and in morning.  Pain worse with movement.    Patient Active Problem List   Diagnosis Date Noted   S/P right knee arthroscopy 02/17/2022   Perimeniscal cyst of right knee 06/09/2021   Acute medial meniscus tear, right, initial encounter 06/09/2021   Unilateral primary osteoarthritis, left knee 01/16/2018   Trigger middle finger of right hand 07/21/2015   Obesity 01/26/2012   Essential hypertension 05/19/2007   Hypothyroidism 11/13/2006   Type 2 diabetes mellitus with hyperglycemia (HCC) 11/13/2006   Hyperlipidemia 11/13/2006   Past Medical History:  Diagnosis Date   Arthritis    DIABETES MELLITUS, TYPE II 11/13/2006   History of kidney stones    HYPERLIPIDEMIA 11/13/2006   HYPERTENSION 05/19/2007   HYPOTHYROIDISM 11/13/2006   TOBACCO USE 12/16/2007   Quit 04/23/10     Past Surgical History:  Procedure Laterality Date   CESAREAN SECTION     COLONOSCOPY  11/11/2009   brodie   IR NEPHROSTOMY PLACEMENT RIGHT  10/15/2020   right foot bone spur surgery      TONSILLECTOMY  1970   TRIGGER FINGER RELEASE     around 2012   Social History   Tobacco Use   Smoking status: Former    Current packs/day: 0.00    Average packs/day: 1 pack/day for 30.0 years (30.0 ttl pk-yrs)    Types:  Cigarettes    Start date: 04/23/1980    Quit date: 04/23/2010    Years since quitting: 13.5   Smokeless tobacco: Never  Vaping Use   Vaping status: Never Used  Substance Use Topics   Alcohol use: Never   Drug use: Never   Family History  Problem Relation Age of Onset   Diabetes Mother    Heart disease Father    Kidney disease Father    Prostate cancer Father    Breast cancer Sister        age 72   Diabetes Brother    Diabetes Brother    Colon cancer Neg Hx    Colon polyps Neg Hx    Esophageal cancer Neg Hx    Rectal cancer Neg Hx    Stomach cancer Neg Hx    No Known Allergies  ROS Negative unless stated above    Objective:     BP 126/74 (BP Location: Left Arm, Patient Position: Sitting, Cuff Size: Normal)   Pulse 78   Temp 99.1 F (37.3 C) (Oral)   Ht 5' 3 (1.6 m)   Wt 176 lb 9.6 oz (80.1 kg)   LMP 12/20/2011   SpO2 96%   BMI 31.28 kg/m  BP Readings from Last 3 Encounters:  11/16/23 126/74  08/30/23 122/70  05/10/23 120/70   Wt Readings from Last 3 Encounters:  11/16/23 176 lb 9.6 oz (80.1 kg)  08/30/23 182 lb 9.6 oz (82.8 kg)  05/10/23 175 lb 6.4 oz (79.6 kg)      Physical Exam Constitutional:      General: She is not in acute distress.    Appearance: Normal appearance.  HENT:     Head: Normocephalic and atraumatic.     Nose: Nose normal.     Mouth/Throat:     Mouth: Mucous membranes are moist.  Cardiovascular:     Rate and Rhythm: Normal rate and regular rhythm.     Heart sounds: Normal heart sounds. No murmur heard.    No gallop.  Pulmonary:     Effort: Pulmonary effort is normal. No respiratory distress.     Breath sounds: Normal breath sounds. No wheezing, rhonchi or rales.  Musculoskeletal:     Cervical back: Normal.     Thoracic back: Normal.     Lumbar back: Normal.     Comments: No TTP of midline cervical, thoracic, lumbar spine or paraspinal muscles.  Tenderness to deep palpation of lower left scapula.  Skin:    General: Skin  is warm and dry.  Neurological:     Mental Status: She is alert and oriented to person, place, and time.        11/16/2023   11:41 AM 02/01/2023    9:58 AM 07/14/2022    2:01 PM  Depression screen PHQ 2/9  Decreased Interest 3 0 0  Down, Depressed, Hopeless 0 0 0  PHQ - 2 Score 3 0 0  Altered sleeping 3 0   Tired, decreased energy 2 1   Change in appetite 0 0   Feeling bad or failure about yourself  0 0   Trouble concentrating 0 0   Moving slowly or fidgety/restless 0 0   Suicidal thoughts 0 0   PHQ-9 Score 8 1   Difficult doing work/chores Very difficult Not difficult at all       11/16/2023   11:41 AM 02/01/2023    9:58 AM 11/04/2020    9:29 AM  GAD 7 : Generalized Anxiety Score  Nervous, Anxious, on Edge 0 0 0  Control/stop worrying 0 0 0  Worry too much - different things 0 0 0  Trouble relaxing 3 0 0  Restless 0 0 0  Easily annoyed or irritable 0 0 0  Afraid - awful might happen 0 0 0  Total GAD 7 Score 3 0 0  Anxiety Difficulty Somewhat difficult Not difficult at all      No results found for any visits on 11/16/23.    Assessment & Plan:   Acute left-sided thoracic back pain -     predniSONE ; Take 5 tabs on day 1, 4 tabs on day 2, 3 tabs on day 3, 2 tabs on day 4, 1 tab on day 5.  Dispense: 15 tablet; Refill: 0 -     Meloxicam ; Take 1 tablet (7.5 mg total) by mouth daily. For back pain  Dispense: 10 tablet; Refill: 0  Strain of left subscapularis muscle, initial encounter -     predniSONE ; Take 5 tabs on day 1, 4 tabs on day 2, 3 tabs on day 3, 2 tabs on day 4, 1 tab on day 5.  Dispense: 15 tablet; Refill: 0 -     Meloxicam ; Take 1 tablet (7.5 mg total) by mouth daily. For back pain  Dispense: 10 tablet; Refill: 0  Encounter for osteoporosis screening in asymptomatic postmenopausal patient -     DG Bone Density; Future  Acute L sided back pain likely d/t msk strain from repetitive lifting and twisting motion at work.  Discussed supportive care including  topical analgesics, NSAIDs or Tylenol , massage, rest, etc.  Start pred taper and mobic .  Advised on likely duration of symptoms.  Consider PT and f/u with sports med for continued or worsening symptoms.  I personally spent a total of 25 minutes in the care of the patient today including getting/reviewing separately obtained history, performing a medically appropriate exam/evaluation, counseling and educating, placing orders, and documenting clinical information in the EHR.   Return if symptoms worsen or fail to improve.   Clotilda JONELLE Single, MD

## 2023-12-06 ENCOUNTER — Encounter: Payer: Self-pay | Admitting: Family Medicine

## 2023-12-06 ENCOUNTER — Ambulatory Visit: Payer: Self-pay | Admitting: Family Medicine

## 2023-12-06 ENCOUNTER — Ambulatory Visit: Admitting: Family Medicine

## 2023-12-06 VITALS — BP 110/62 | HR 65 | Temp 98.6°F | Ht 63.0 in | Wt 175.8 lb

## 2023-12-06 DIAGNOSIS — Z780 Asymptomatic menopausal state: Secondary | ICD-10-CM

## 2023-12-06 DIAGNOSIS — Z13820 Encounter for screening for osteoporosis: Secondary | ICD-10-CM | POA: Diagnosis not present

## 2023-12-06 DIAGNOSIS — M546 Pain in thoracic spine: Secondary | ICD-10-CM | POA: Diagnosis not present

## 2023-12-06 DIAGNOSIS — S46812D Strain of other muscles, fascia and tendons at shoulder and upper arm level, left arm, subsequent encounter: Secondary | ICD-10-CM | POA: Diagnosis not present

## 2023-12-06 LAB — VITAMIN B12: Vitamin B-12: 535 pg/mL (ref 211–911)

## 2023-12-06 NOTE — Progress Notes (Signed)
 Established Patient Office Visit   Subjective  Patient ID: Maria Hammond, female    DOB: [birth year]  Age: 63 y.o. MRN: [id number]  Chief Complaint  Patient presents with   Acute Visit    Patient came in today for a follow-up for the left sided thoracic back pain, patient rates the pain a 3 out of 10, patient states the Medication is not helping, and vit B12 check     Pt is a 63 year old female seen for follow-up on ongoing back pain.  Patient endorses continued upper left medial back pain at medial edge of left scapula.  Today pain 3/10 but can be 7-8/10 and 10/10.  Patient has difficulty describing pain.  States it is aggravating and sometimes sharp.  States medications are not helping.  Given steroid taper and Mobic  at last OFV.  Patient requesting B12 check.    Patient Active Problem List   Diagnosis Date Noted   S/P right knee arthroscopy 02/17/2022   Perimeniscal cyst of right knee 06/09/2021   Acute medial meniscus tear, right, initial encounter 06/09/2021   Unilateral primary osteoarthritis, left knee 01/16/2018   Trigger middle finger of right hand 07/21/2015   Obesity 01/26/2012   Essential hypertension 05/19/2007   Hypothyroidism 11/13/2006   Type 2 diabetes mellitus with hyperglycemia (HCC) 11/13/2006   Hyperlipidemia 11/13/2006   Past Medical History:  Diagnosis Date   Arthritis    DIABETES MELLITUS, TYPE II 11/13/2006   History of kidney stones    HYPERLIPIDEMIA 11/13/2006   HYPERTENSION 05/19/2007   HYPOTHYROIDISM 11/13/2006   TOBACCO USE 12/16/2007   Quit 04/23/10     Past Surgical History:  Procedure Laterality Date   CESAREAN SECTION     COLONOSCOPY  11/11/2009   brodie   IR NEPHROSTOMY PLACEMENT RIGHT  10/15/2020   right foot bone spur surgery      TONSILLECTOMY  1970   TRIGGER FINGER RELEASE     around 2012   Social History   Tobacco Use   Smoking status: Former    Current packs/day: 0.00    Average packs/day: 1 pack/day for 30.0 years  (30.0 ttl pk-yrs)    Types: Cigarettes    Start date: 04/23/1980    Quit date: 04/23/2010    Years since quitting: 13.6   Smokeless tobacco: Never  Vaping Use   Vaping status: Never Used  Substance Use Topics   Alcohol use: Never   Drug use: Never   Family History  Problem Relation Age of Onset   Diabetes Mother    Heart disease Father    Kidney disease Father    Prostate cancer Father    Breast cancer Sister        age 36   Diabetes Brother    Diabetes Brother    Colon cancer Neg Hx    Colon polyps Neg Hx    Esophageal cancer Neg Hx    Rectal cancer Neg Hx    Stomach cancer Neg Hx    No Known Allergies  ROS Negative unless stated above    Objective:     BP 110/62 (BP Location: Left Arm, Patient Position: Sitting, Cuff Size: Normal)   Pulse 65   Temp 98.6 F (37 C) (Oral)   Ht 5' 3 (1.6 m)   Wt 175 lb 12.8 oz (79.7 kg)   LMP 12/20/2011   SpO2 98%   BMI 31.14 kg/m  BP Readings from Last 3 Encounters:  12/06/23 110/62  11/16/23 126/74  08/30/23  122/70   Wt Readings from Last 3 Encounters:  12/06/23 175 lb 12.8 oz (79.7 kg)  11/16/23 176 lb 9.6 oz (80.1 kg)  08/30/23 182 lb 9.6 oz (82.8 kg)      Physical Exam Constitutional:      General: She is not in acute distress.    Appearance: Normal appearance.  HENT:     Head: Normocephalic and atraumatic.     Nose: Nose normal.     Mouth/Throat:     Mouth: Mucous membranes are moist.  Cardiovascular:     Rate and Rhythm: Normal rate and regular rhythm.     Heart sounds: Normal heart sounds. No murmur heard.    No gallop.  Pulmonary:     Effort: Pulmonary effort is normal. No respiratory distress.     Breath sounds: Normal breath sounds. No wheezing, rhonchi or rales.  Skin:    General: Skin is warm and dry.  Neurological:     Mental Status: She is alert and oriented to person, place, and time.        12/06/2023    9:55 AM 11/16/2023   11:41 AM 02/01/2023    9:58 AM  Depression screen PHQ 2/9   Decreased Interest 1 3 0  Down, Depressed, Hopeless 0 0 0  PHQ - 2 Score 1 3 0  Altered sleeping 1 3 0  Tired, decreased energy 3 2 1   Change in appetite 0 0 0  Feeling bad or failure about yourself  0 0 0  Trouble concentrating 0 0 0  Moving slowly or fidgety/restless 0 0 0  Suicidal thoughts 0 0 0  PHQ-9 Score 5 8 1   Difficult doing work/chores Somewhat difficult Very difficult Not difficult at all      12/06/2023    9:55 AM 11/16/2023   11:41 AM 02/01/2023    9:58 AM 11/04/2020    9:29 AM  GAD 7 : Generalized Anxiety Score  Nervous, Anxious, on Edge 0 0 0 0  Control/stop worrying 0 0 0 0  Worry too much - different things 0 0 0 0  Trouble relaxing 1 3 0 0  Restless 0 0 0 0  Easily annoyed or irritable 0 0 0 0  Afraid - awful might happen 0 0 0 0  Total GAD 7 Score 1 3 0 0  Anxiety Difficulty Not difficult at all Somewhat difficult Not difficult at all      No results found for any visits on 12/06/23.    Assessment & Plan:   Left-sided thoracic back pain, unspecified chronicity -     Ambulatory referral to Physical Therapy -     Ambulatory referral to Sports Medicine -     Vitamin B12; Future  Strain of left subscapularis muscle, subsequent encounter -     Ambulatory referral to Physical Therapy -     Ambulatory referral to Sports Medicine   Referral to PT and Sports med for continued pain of L medial lower scapula.  Xray unavailable in clinic.  Continue supportive care.  B12 ordered per pt request.  Pt to schedule bone density as order previously placed.  No follow-ups on file.   Clotilda JONELLE Single, MD

## 2023-12-13 ENCOUNTER — Ambulatory Visit
Admission: RE | Admit: 2023-12-13 | Discharge: 2023-12-13 | Disposition: A | Discharge status: Home / Self Care | Source: Ambulatory Visit | Attending: Family Medicine | Admitting: Family Medicine

## 2023-12-13 DIAGNOSIS — Z1231 Encounter for screening mammogram for malignant neoplasm of breast: Secondary | ICD-10-CM

## 2023-12-17 ENCOUNTER — Other Ambulatory Visit: Payer: Self-pay

## 2023-12-17 DIAGNOSIS — E785 Hyperlipidemia, unspecified: Secondary | ICD-10-CM

## 2023-12-17 MED ORDER — ATORVASTATIN CALCIUM 20 MG PO TABS: 20.0000 mg | ORAL_TABLET | Freq: Every day | ORAL | 1 refills | Status: AC

## 2023-12-26 NOTE — Progress Notes (Unsigned)
 [facility] [address] [place] [address] Phone: [phone]   Assessment and Plan:     1. Chronic left-sided thoracic back pain (Primary) 2. Dyskinesis of left scapula 3. Somatic dysfunction of thoracic region 4. Somatic dysfunction of rib region 5. Somatic dysfunction of lumbar region -Chronic with exacerbation, initial sports medicine visit - Most consistent with left scapular dyskinesis without scapular winging likely aggravated by physical activity and sleeping position and causing surrounding muscular pain through thoracic spine, rhomboids - Start Celebrex  200 mg twice daily x2 weeks.  If still having pain after 2 weeks, complete 3rd-week of NSAID. May use remaining NSAID as needed once daily for pain control.  Do not to use additional over-the-counter NSAIDs (ibuprofen, naproxen, Advil, Aleve, etc.) while taking prescription NSAIDs.  May use Tylenol  [phone] mg 2 to 3 times a day for breakthrough pain.  -Start HEP and physical therapy starting thoracic and scapular regions - Patient elected for initial OMT today.  Tolerated well per note below. - Decision today to treat with OMT was based on Physical Exam  After verbal consent patient was treated with HVLA (high velocity low amplitude), ME (muscle energy), FPR (flex positional release), ST (soft tissue),  techniques in  , rib, thoracic, lumbar, and  areas. Patient tolerated the procedure well with improvement in symptoms.  Patient educated on potential side effects of soreness and recommended to rest, hydrate, and use Tylenol  as needed for pain control.   15 additional minutes spent for educating Therapeutic Home Exercise Program.  This included exercises focusing on stretching, strengthening, with focus on eccentric aspects.   Long term goals include an improvement in range of motion, strength, endurance as well as avoiding reinjury. Patient's frequency would include in 1-2  times a day, 3-5 times a week for a duration of 6-12 weeks. Proper technique shown and discussed handout in great detail with ATC.  All questions were discussed and answered.    Pertinent previous records reviewed include thoracic x-ray 2021, family medicine note 12/06/2023   Follow Up: 4 to 6 weeks for reevaluation.  Could consider repeat OMT versus trigger point injections   Subjective:   I, Maria Hammond, am serving as a Neurosurgeon for Doctor Morene Mace  Chief Complaint: back pain   HPI:   12/27/2023 Patient is a 63 year old female with back pain. Patient states pain started 2 months ago. Thoracic and lumbar pain. No MOI. Hx of intermittent back pain. No radiating pain. L side pain only. Ibu , muscle relaxer, and prednisone  have not helped. Ice, heat and rest have not helped. Decreased ROM. Intermittent pain, constant annoyance.    Relevant Historical Information: Hypertension, DM type II  Additional pertinent review of systems negative.   Current Outpatient Medications:    celecoxib  (CELEBREX ) 200 MG capsule, Take 1 capsule (200 mg total) by mouth 2 (two) times daily., Disp: 60 capsule, Rfl: 0   atorvastatin  (LIPITOR) 20 MG tablet, Take 1 tablet (20 mg total) by mouth daily., Disp: 90 tablet, Rfl: 1   Continuous Glucose Sensor (DEXCOM G7 SENSOR) MISC, Use to check glucose continuously, change sensor every 10 days, Disp: 9 each, Rfl: 3   Continuous Glucose Sensor (FREESTYLE LIBRE 3 PLUS SENSOR) MISC, Inject 1 Device into the skin continuous. Change every 15 days, Disp: 6 each, Rfl: 3   empagliflozin  (JARDIANCE ) 10 MG TABS tablet, Take 1 tablet (10 mg total) by mouth daily before breakfast., Disp: 90 tablet, Rfl: 3  glipiZIDE  (GLUCOTROL ) 5 MG tablet, Take 0.5 tablets (2.5 mg total) by mouth daily before lunch., Disp: 90 tablet, Rfl: 1   levothyroxine  (SYNTHROID ) 50 MCG tablet, Take 1 tablet (50 mcg total) by mouth daily., Disp: 90 tablet, Rfl: 3   lisinopril  (ZESTRIL ) 5 MG tablet,  Take 1 tablet by mouth once daily, Disp: 90 tablet, Rfl: 2   meloxicam  (MOBIC ) 7.5 MG tablet, Take 1 tablet (7.5 mg total) by mouth daily. For back pain, Disp: 10 tablet, Rfl: 0   metFORMIN  (GLUCOPHAGE -XR) 500 MG 24 hr tablet, Take 1000 mg by mouth 2x a day, Disp: 360 tablet, Rfl: 3   predniSONE  (DELTASONE ) 10 MG tablet, Take 5 tabs on day 1, 4 tabs on day 2, 3 tabs on day 3, 2 tabs on day 4, 1 tab on day 5., Disp: 15 tablet, Rfl: 0   tiZANidine  (ZANAFLEX ) 4 MG tablet, Take 1 tablet (4 mg total) by mouth every 6 (six) hours as needed for muscle spasms., Disp: 30 tablet, Rfl: 0   Objective:     Vitals:   12/27/23 0812  BP: 108/72  Pulse: 75  SpO2: 98%  Weight: 173 lb (78.5 kg)  Height: 5' 3 (1.6 m)      Body mass index is 30.65 kg/m.    Physical Exam:    Gen: Appears well, nad, nontoxic and pleasant Psych: Alert and oriented, appropriate mood and affect Neuro: sensation intact, strength is 5/5 in upper and lower extremities, muscle tone wnl Skin: no susupicious lesions or rashes  Back - Normal skin, Spine with normal alignment and no deformity.     tenderness to T4-T6 vertebral process palpation.   Left thoracic paraspinous muscles are   tender and without spasm  Lacking motion in the left scapula compared to right with shoulder abduction and flexion.  No scapular winging  OMT Physical Exam:  Rib: Ribs 4-6 stuck in inhalation on left Thoracic: TTP paraspinal, T4-6 RLSR Lumbar: TTP paraspinal, L1-3 RRSL   Electronically signed by:  Odis Mace D.[name] 8:41 AM 12/27/23

## 2023-12-27 ENCOUNTER — Ambulatory Visit: Admitting: Sports Medicine

## 2023-12-27 VITALS — BP 108/72 | HR 75 | Ht 63.0 in | Wt 173.0 lb

## 2023-12-27 DIAGNOSIS — M546 Pain in thoracic spine: Secondary | ICD-10-CM

## 2023-12-27 DIAGNOSIS — M9903 Segmental and somatic dysfunction of lumbar region: Secondary | ICD-10-CM | POA: Diagnosis not present

## 2023-12-27 DIAGNOSIS — G8929 Other chronic pain: Secondary | ICD-10-CM

## 2023-12-27 DIAGNOSIS — M9908 Segmental and somatic dysfunction of rib cage: Secondary | ICD-10-CM

## 2023-12-27 DIAGNOSIS — M25312 Other instability, left shoulder: Secondary | ICD-10-CM

## 2023-12-27 DIAGNOSIS — M9902 Segmental and somatic dysfunction of thoracic region: Secondary | ICD-10-CM | POA: Diagnosis not present

## 2023-12-27 MED ORDER — CELECOXIB 200 MG PO CAPS: 200.0000 mg | ORAL_CAPSULE | Freq: Two times a day (BID) | ORAL | 0 refills | Status: DC

## 2023-12-27 NOTE — Patient Instructions (Addendum)
-   Celebrex  200 mg 2x daily for 2 weeks  . If still having pain after 2 weeks, complete 3rd-week of NSAID. May use remaining NSAID as needed once daily for pain control.  Do not to use additional over-the-counter NSAIDs (ibuprofen, naproxen, Advil, Aleve, etc.) while taking prescription NSAIDs.  May use Tylenol  [phone] mg 2 to 3 times a day for breakthrough pain.  Pt referral   4-6 week follow up

## 2023-12-28 ENCOUNTER — Other Ambulatory Visit: Payer: Self-pay | Admitting: Internal Medicine

## 2023-12-28 DIAGNOSIS — E1165 Type 2 diabetes mellitus with hyperglycemia: Secondary | ICD-10-CM

## 2024-01-03 ENCOUNTER — Encounter: Payer: Self-pay | Admitting: Internal Medicine

## 2024-01-03 ENCOUNTER — Ambulatory Visit: Admitting: Internal Medicine

## 2024-01-03 VITALS — BP 110/58 | HR 75 | Ht 63.0 in | Wt 173.8 lb

## 2024-01-03 DIAGNOSIS — E039 Hypothyroidism, unspecified: Secondary | ICD-10-CM | POA: Diagnosis not present

## 2024-01-03 DIAGNOSIS — E1165 Type 2 diabetes mellitus with hyperglycemia: Secondary | ICD-10-CM

## 2024-01-03 DIAGNOSIS — E119 Type 2 diabetes mellitus without complications: Secondary | ICD-10-CM | POA: Diagnosis not present

## 2024-01-03 DIAGNOSIS — E785 Hyperlipidemia, unspecified: Secondary | ICD-10-CM

## 2024-01-03 DIAGNOSIS — Z7984 Long term (current) use of oral hypoglycemic drugs: Secondary | ICD-10-CM | POA: Diagnosis not present

## 2024-01-03 LAB — POCT GLYCOSYLATED HEMOGLOBIN (HGB A1C): Hemoglobin A1C: 6.6 % — AB (ref 4.0–5.6)

## 2024-01-03 NOTE — Patient Instructions (Addendum)
 Please continue: - Metformin  ER 1000 mg 2x a day  Please continue Levothyroxine  50 mcg daily.  Take the thyroid  hormone every day, with water, at least 30 minutes before breakfast, separated by at least 4 hours from: - acid reflux medications - calcium  - iron - multivitamins    Please stop chips.  Please return in 4 months.

## 2024-01-03 NOTE — Progress Notes (Signed)
 Patient ID: Maria Hammond, female   DOB: [birth year], 63 y.o.   MRN: [id number]  HPI: Maria Hammond is a 62 y.o.-year-old female, returning for f/u for DM2, dx 2008, non-insulin-dependent, controlled, without long term complications. Last visit 4 months ago.  Interim history: No increased urination, blurry vision, nausea, chest pain. Since last visit, she changed her hours and increased activity at work. She has back muscle spasms.  DM2: Reviewed HbA1c levels: Lab Results  Component Value Date   HGBA1C 6.9 (A) 08/30/2023   HGBA1C 6.8 (A) 05/10/2023   HGBA1C 7.1 (H) 02/01/2023   HGBA1C 6.6 (A) 09/07/2022   HGBA1C 6.4 (A) 05/04/2022   HGBA1C 6.5 (A) 01/19/2022   HGBA1C 6.4 (A) 09/15/2021   HGBA1C 6.8 (A) 05/12/2021   HGBA1C 6.8 (A) 01/06/2021   HGBA1C 7.4 (H) 11/04/2020   HGBA1C 7.1 (A) 09/02/2020   HGBA1C 6.9 (A) 04/29/2020   HGBA1C 6.5 (A) 08/27/2019   HGBA1C 6.2 (A) 02/24/2019   HGBA1C 6.5 (A) 11/06/2018   HGBA1C 6.9 (H) 08/12/2018   HGBA1C 6.1 (A) 02/25/2018   HGBA1C 6.6 (A) 10/22/2017   HGBA1C 7.7 (H) 07/19/2017   HGBA1C 7.2 03/13/2017  03/13/2017: HbA1c calculated from fructosamine 6.16%  Pt is on a regimen of: - Metformin  ER 1000 mg 2x a day We tried to start Jardiance  10 mg before breakfast in 08/2023 but $$$. We tried Rybelsus  08/2020 - $$$. Retried 04/2021 >> $$$. She could not afford Januvia  and Tradjenta . We stopped Amaryl  2 mg in 03/2013 as her sugars were at goal and even had some lows and got lightheaded 4 h after b'fast, while at work (resolved after having a snack).  I previously recommended glipizide  ER but she was not using it.  Pt checked her sugars >4x a day:  Previously:  Previously:   Lowest: 58 >> ... 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 Highest sugars: 298 >> 300 >> 200.  Meter: ReliOn  Pt's meals are: - Breakfast (4 am): 3 strips [place] bacon + 1 egg + coffee - splenda and cream - snack: banana, nuts - Lunch:  PB sandwich + bag of chips + yoghurt + fruit + diet Mtn Dew - snack: fruit and nuts - Dinner: sauerkraut + sausage She was drinking up 3 gallons of milk a week! >> now almond milk  She works starting at 4 AM and has to wake up at 2 AM.  She works until approximately 12 PM.  She goes to bed around 8 PM.  -No CKD, last BUN/creatinine:  Lab Results  Component Value Date   BUN 8 02/01/2023   CREATININE 0.55 02/01/2023   Lab Results  Component Value Date   MICRALBCREAT NOTE 08/30/2023   MICRALBCREAT 13.0 10/08/2008   MICRALBCREAT 11.8 05/09/2007   MICRALBCREAT 5.0 11/08/2006  On lisinopril .  -+ HL; last set of lipids: Lab Results  Component Value Date   CHOL 169 02/01/2023   HDL 72.70 02/01/2023   LDLCALC 86 02/01/2023   LDLDIRECT 66.0 07/13/2014   TRIG 49.0 02/01/2023   CHOLHDL 2 02/01/2023  On Lipitor 20 mg daily.  - last eye exam was in 11/2022: No DR.  Dr Daina.  Coming up this month.  - no numbness and tingling in her feet. She had Sx R big toe 02/2019. She sees podiatry. She had numbness in fingertips >> resolved after B12 supplementation. She was found to have a B12 of 192 in 09/2019.  She had hammer toe  sx (R foot) in 2023 and saw Dr. Magdalen several times.  Last foot exam 02/14/2023 Dr Magdalen.   Hypothyroidism:  Reviewed her TFTs: Lab Results  Component Value Date   TSH 0.70 01/04/2023   TSH 0.92 01/19/2022   TSH 1.24 05/04/2021   Pt is on levothyroxine  50 mcg daily (dose decreased 10/2019), taken: - in am - fasting - at least 1h from b'fast - no calcium  - no iron - no multivitamins - no PPIs - not on Biotin  Pt denies: - feeling nodules in neck - hoarseness - dysphagia - choking  ROS: + see HPI  I reviewed pt's medications, allergies, PMH, social hx, family hx, and changes were documented in the history of present illness. Otherwise, unchanged from my initial visit note.  Past Medical History:  Diagnosis Date   Arthritis    DIABETES MELLITUS,  TYPE II 11/13/2006   History of kidney stones    HYPERLIPIDEMIA 11/13/2006   HYPERTENSION 05/19/2007   HYPOTHYROIDISM 11/13/2006   TOBACCO USE 12/16/2007   Quit 04/23/10     Past Surgical History:  Procedure Laterality Date   CESAREAN SECTION     COLONOSCOPY  11/11/2009   brodie   IR NEPHROSTOMY PLACEMENT RIGHT  10/15/2020   right foot bone spur surgery      TONSILLECTOMY  1970   TRIGGER FINGER RELEASE     around 2012   Social History   Socioeconomic History   Marital status: Divorced    Spouse name: Not on file   Number of children: Not on file   Years of education: Not on file   Highest education level: Not on file  Occupational History   Not on file  Tobacco Use   Smoking status: Former    Current packs/day: 0.00    Average packs/day: 1 pack/day for 30.0 years (30.0 ttl pk-yrs)    Types: Cigarettes    Start date: 04/23/1980    Quit date: 04/23/2010    Years since quitting: 13.7   Smokeless tobacco: Never  Vaping Use   Vaping status: Never Used  Substance and Sexual Activity   Alcohol use: Never   Drug use: Never   Sexual activity: Yes    Partners: Male  Other Topics Concern   Not on file  Social History Narrative   New relationship.    In process of divorce (starting 2015- still going 2018). 2 children- boy 47 autism goes to [facility] and works and girl 39 works at the jail in 2016. Son lives with her.  Husband no longer in house. Daughter in [place].       Works at [employer]- 24 years in May 2018.       Hobbies: tv, facebook/internet. Church.       Regular exercise: not at this time   Caffeine use: Diet Mt Dew   Social Drivers of Health   Financial Resource Strain: Not on file  Food Insecurity: Not on file  Transportation Needs: Not on file  Physical Activity: Not on file  Stress: Not on file  Social Connections: Not on file  Intimate Partner Violence: Not on file   Current Outpatient Medications on File Prior to Visit  Medication Sig Dispense Refill    atorvastatin  (LIPITOR) 20 MG tablet Take 1 tablet (20 mg total) by mouth daily. 90 tablet 1   celecoxib  (CELEBREX ) 200 MG capsule Take 1 capsule (200 mg total) by mouth 2 (two) times daily. 60 capsule 0   Continuous Glucose Sensor (DEXCOM G7  SENSOR) MISC Use to check glucose continuously, change sensor every 10 days 9 each 3   Continuous Glucose Sensor (FREESTYLE LIBRE 3 PLUS SENSOR) MISC Inject 1 Device into the skin continuous. Change every 15 days 6 each 3   empagliflozin  (JARDIANCE ) 10 MG TABS tablet Take 1 tablet (10 mg total) by mouth daily before breakfast. 90 tablet 3   glipiZIDE  (GLUCOTROL ) 5 MG tablet Take 0.5 tablets (2.5 mg total) by mouth daily before lunch. 90 tablet 1   levothyroxine  (SYNTHROID ) 50 MCG tablet Take 1 tablet (50 mcg total) by mouth daily. 90 tablet 3   lisinopril  (ZESTRIL ) 5 MG tablet Take 1 tablet by mouth once daily 90 tablet 2   meloxicam  (MOBIC ) 7.5 MG tablet Take 1 tablet (7.5 mg total) by mouth daily. For back pain 10 tablet 0   metFORMIN  (GLUCOPHAGE -XR) 500 MG 24 hr tablet Take 2 tablets by mouth twice daily 360 tablet 4   predniSONE  (DELTASONE ) 10 MG tablet Take 5 tabs on day 1, 4 tabs on day 2, 3 tabs on day 3, 2 tabs on day 4, 1 tab on day 5. 15 tablet 0   tiZANidine  (ZANAFLEX ) 4 MG tablet Take 1 tablet (4 mg total) by mouth every 6 (six) hours as needed for muscle spasms. 30 tablet 0   No current facility-administered medications on file prior to visit.   No Known Allergies Family History  Problem Relation Age of Onset   Diabetes Mother    Heart disease Father    Kidney disease Father    Prostate cancer Father    Breast cancer Sister        age 56   Breast cancer Sister    Breast cancer Sister    Breast cancer Daughter    Diabetes Brother    Diabetes Brother    Colon cancer Neg Hx    Colon polyps Neg Hx    Esophageal cancer Neg Hx    Rectal cancer Neg Hx    Stomach cancer Neg Hx    PE: BP (!) 110/58   Pulse 75   Ht 5' 3 (1.6 m)   Wt 173  lb 12.8 oz (78.8 kg)   LMP 12/20/2011   SpO2 94%   BMI 30.79 kg/m    Wt Readings from Last 15 Encounters:  01/03/24 173 lb 12.8 oz (78.8 kg)  12/27/23 173 lb (78.5 kg)  12/06/23 175 lb 12.8 oz (79.7 kg)  11/16/23 176 lb 9.6 oz (80.1 kg)  08/30/23 182 lb 9.6 oz (82.8 kg)  05/10/23 175 lb 6.4 oz (79.6 kg)  02/14/23 173 lb (78.5 kg)  02/01/23 173 lb 9.6 oz (78.7 kg)  01/04/23 178 lb 6.4 oz (80.9 kg)  09/07/22 178 lb (80.7 kg)  07/14/22 178 lb 9.6 oz (81 kg)  05/04/22 182 lb (82.6 kg)  01/19/22 178 lb 3.2 oz (80.8 kg)  09/15/21 179 lb 12.8 oz (81.6 kg)  05/12/21 178 lb 12.8 oz (81.1 kg)   Constitutional: overweight, in NAD Eyes:  EOMI, no exophthalmos ENT: no neck masses, no cervical lymphadenopathy Cardiovascular: RRR, No MRG Respiratory: CTA B Musculoskeletal: no deformities Skin:no rashes Neurological: no tremor with outstretched hands Diabetic Foot Exam - Simple   Simple Foot Form Diabetic Foot exam was performed with the following findings: Yes 01/03/2024  8:37 AM  Visual Inspection No deformities, no ulcerations, no other skin breakdown bilaterally: Yes Sensation Testing See comments: Yes Pulse Check Posterior Tibialis and Dorsalis pulse intact bilaterally: Yes Comments Mild decrease in  sensation to monofilament in first 3 R toes (surgery site)    ASSESSMENT: 1. DM2, non-insulin-dependent, controlled, without long term complications, but with occasional hyperglycemia  2. Hypothyroidism  3. HL  PLAN:  1. Patient with controlled type 2 diabetes, on metformin  ER (as she had bloating with the IR formulation), with higher HbA1c at last visit, at 6.9%.  The Rybelsus  stopped being covered by her insurance but we have her on metformin  and Jardiance .  Sugars were more fluctuating at last visit, with spikes into the 300s occasionally.  They were increasing after every meal but upon questioning, she was still not taking the glipizide  at all.  I suggested an SGLT2 inhibitor  after an explanation of the mechanism of action and expected benefits.  I did advise her to stay well-hydrated. -We previously discussed about lowering the glycemic load of her meals.  We discussed about fruit with lower glycemic index and advised her to eat the higher glycemic index fruit in small amounts, and after meals.   CGM interpretation: -At today's visit, we reviewed her CGM downloads: It appears that 95% of values are in target range (goal >70%), while 5% are higher than 180 (goal <25%), and 0% are lower than 70 (goal <4%).  The calculated average blood sugar is 118.  The projected HbA1c for the next 3 months (GMI) is 6.1%. -Reviewing the CGM trends, sugars appear to be much improved from previous, most likely due to increased activity at work.  She has an occasional hyperglycemic spike after lunch and we discussed that this may be related to having at least 3 carbs for lunch: Bread, chips, fruit.  I advised her to eliminate the chips.  Otherwise, I did not recommend a change in regimen.  Unfortunately, she was not able to start Jardiance , despite the fact that this was covered with a PA for her, as it was still expensive. - I suggested to:  Patient Instructions  Please continue: - Metformin  ER 1000 mg 2x a day  Please continue Levothyroxine  50 mcg daily.  Take the thyroid  hormone every day, with water, at least 30 minutes before breakfast, separated by at least 4 hours from: - acid reflux medications - calcium  - iron - multivitamins    Please stop chips.  Please return in 4 months.  - we checked her HbA1c: 6.6% (lower) - advised to check sugars at different times of the day - 4x a day, rotating check times - advised for yearly eye exams >> she is due -appointment coming up - return to clinic in 4 months  2. Hypothyroidism - latest thyroid  labs reviewed with pt. >> normal: Lab Results  Component Value Date   TSH 0.70 01/04/2023  - she continues on LT4 50 mcg daily - pt  feels good on this dose. - we discussed about taking the thyroid  hormone every day, with water, >30 minutes before breakfast, separated by >4 hours from acid reflux medications, calcium , iron, multivitamins. Pt. is taking it correctly. - will check thyroid  tests today: TSH and fT4 - If labs are abnormal, she will need to return for repeat TFTs in 1.5 months  3. HL -She has a high LP(a) - Latest lipid panel was reviewed from 01/2023: Fractions at goal: Lab Results  Component Value Date   CHOL 169 02/01/2023   HDL 72.70 02/01/2023   LDLCALC 86 02/01/2023   LDLDIRECT 66.0 07/13/2014   TRIG 49.0 02/01/2023   CHOLHDL 2 02/01/2023  - She is on Lipitor 20 mg  daily without side effects - She is due for another lipid panel - will check today (nonfasting)  Needs refills LT4.  Orders Placed This Encounter  Procedures   TSH   Lipid Panel w/reflex Direct LDL   Comprehensive metabolic panel with GFR   T4, free   POCT glycosylated hemoglobin (Hb A1C)   Lela Fendt, MD PhD [facility] Endocr[facility]

## 2024-01-04 ENCOUNTER — Ambulatory Visit: Payer: Self-pay | Admitting: Internal Medicine

## 2024-01-04 LAB — LIPID PANEL W/REFLEX DIRECT LDL
Cholesterol: 149 mg/dL (ref <200)
HDL: 71 mg/dL (ref >=50)
LDL Cholesterol (Calc): 66 mg/dL
Non-HDL Cholesterol (Calc): 78 mg/dL (ref <130)
Total CHOL/HDL Ratio: 2.1 (calc) (ref <5.0)
Triglycerides: 43 mg/dL (ref <150)

## 2024-01-04 LAB — COMPREHENSIVE METABOLIC PANEL WITH GFR
AG Ratio: 1.4 (calc) (ref 1.0–2.5)
ALT: 12 U/L (ref 6–29)
AST: 11 U/L (ref 10–35)
Albumin: 3.9 g/dL (ref 3.6–5.1)
Alkaline phosphatase (APISO): 69 U/L (ref 37–153)
BUN: 14 mg/dL (ref 7–25)
CO2: 30 mmol/L (ref 20–32)
Calcium: 9.8 mg/dL (ref 8.6–10.4)
Chloride: 103 mmol/L (ref 98–110)
Creat: 0.6 mg/dL (ref 0.50–1.05)
Globulin: 2.8 g/dL (ref 1.9–3.7)
Glucose, Bld: 89 mg/dL (ref 65–139)
Potassium: 4.4 mmol/L (ref 3.5–5.3)
Sodium: 139 mmol/L (ref 135–146)
Total Bilirubin: 0.5 mg/dL (ref 0.2–1.2)
Total Protein: 6.7 g/dL (ref 6.1–8.1)
eGFR: 100 mL/min/1.73m2 (ref >=60)

## 2024-01-04 LAB — T4, FREE: Free T4: 1.5 ng/dL (ref 0.8–1.8)

## 2024-01-04 LAB — TSH: TSH: 0.54 m[IU]/L (ref 0.40–4.50)

## 2024-01-17 ENCOUNTER — Ambulatory Visit: Attending: Family Medicine

## 2024-01-17 ENCOUNTER — Other Ambulatory Visit: Payer: Self-pay

## 2024-01-17 DIAGNOSIS — G8929 Other chronic pain: Secondary | ICD-10-CM | POA: Insufficient documentation

## 2024-01-17 DIAGNOSIS — M9902 Segmental and somatic dysfunction of thoracic region: Secondary | ICD-10-CM | POA: Insufficient documentation

## 2024-01-17 DIAGNOSIS — M25312 Other instability, left shoulder: Secondary | ICD-10-CM | POA: Diagnosis not present

## 2024-01-17 DIAGNOSIS — M6281 Muscle weakness (generalized): Secondary | ICD-10-CM | POA: Diagnosis present

## 2024-01-17 DIAGNOSIS — M9908 Segmental and somatic dysfunction of rib cage: Secondary | ICD-10-CM | POA: Diagnosis not present

## 2024-01-17 DIAGNOSIS — M9903 Segmental and somatic dysfunction of lumbar region: Secondary | ICD-10-CM | POA: Insufficient documentation

## 2024-01-17 DIAGNOSIS — M546 Pain in thoracic spine: Secondary | ICD-10-CM | POA: Diagnosis present

## 2024-01-17 LAB — OPHTHALMOLOGY REPORT-SCANNED

## 2024-01-17 NOTE — Therapy (Signed)
 OUTPATIENT PHYSICAL THERAPY THORACOLUMBAR EVALUATION   Patient Name: Maria Hammond MRN: [id number] DOB:June 02, 1960, 63 y.o., female Today's Date: 01/20/2024  END OF SESSION:  PT End of Session - 01/20/24 1454     Visit Number 1    Number of Visits 9    Date for Recertification  03/16/24    Authorization Type BCBS    PT Start Time 1310    PT Stop Time 1400    PT Time Calculation (min) 50 min    Activity Tolerance Patient tolerated treatment well    Behavior During Therapy WFL for tasks assessed/performed          Past Medical History:  Diagnosis Date   Arthritis    DIABETES MELLITUS, TYPE II 11/13/2006   History of kidney stones    HYPERLIPIDEMIA 11/13/2006   HYPERTENSION 05/19/2007   HYPOTHYROIDISM 11/13/2006   TOBACCO USE 12/16/2007   Quit 04/23/10     Past Surgical History:  Procedure Laterality Date   CESAREAN SECTION     COLONOSCOPY  11/11/2009   brodie   IR NEPHROSTOMY PLACEMENT RIGHT  10/15/2020   right foot bone spur surgery      TONSILLECTOMY  1970   TRIGGER FINGER RELEASE     around 2012   Patient Active Problem List   Diagnosis Date Noted   S/P right knee arthroscopy 02/17/2022   Perimeniscal cyst of right knee 06/09/2021   Acute medial meniscus tear, right, initial encounter 06/09/2021   Unilateral primary osteoarthritis, left knee 01/16/2018   Trigger middle finger of right hand 07/21/2015   Obesity 01/26/2012   Essential hypertension 05/19/2007   Hypothyroidism 11/13/2006   Type 2 diabetes mellitus with hyperglycemia (HCC) 11/13/2006   Hyperlipidemia 11/13/2006    PCP: Mercer Clotilda SAUNDERS, MD  REFERRING PROVIDER: Leonce Katz, DO   REFERRING DIAG:  [phone] (ICD-10-CM) - Chronic left-sided thoracic back pain M25.312 (ICD-10-CM) - Dyskinesis of left scapula M99.02 (ICD-10-CM) - Somatic dysfunction of thoracic region M99.08 (ICD-10-CM) - Somatic dysfunction of rib region M99.03 (ICD-10-CM) - Somatic dysfunction of lumbar  region  Rationale for Evaluation and Treatment: Rehabilitation  THERAPY DIAG:  Pain in thoracic spine - Plan: PT plan of care cert/re-cert  Muscle weakness (generalized) - Plan: PT plan of care cert/re-cert  ONSET DATE: 4 months  SUBJECTIVE:                                                                                                                                                                                           SUBJECTIVE STATEMENT: Pt presents to PT with roughly 4 month hx of L periscapular pain that radiates into flank.  No MOI or trauma, she works at [employer] and is unsure if any work activity changes symptoms. Nothing seems to help alleviate pain. Denies N/T or any red flag items. [place] drive to the beach a [place] weeks ago greatly increased pain.  PERTINENT HISTORY:  DM II, HTN  PAIN:  Are you having pain?  Yes: NPRS scale: 5/10 Worst: 10/10 Pain location: L flank, L thoracic spine Pain description: sharp, sore Aggravating factors: driving, lifting, twisting Relieving factors: none  PRECAUTIONS: None  RED FLAGS: None   WEIGHT BEARING RESTRICTIONS: No  FALLS:  Has patient fallen in last 6 months? Yes. Number of falls - one fall at house, after pain had already started  LIVING ENVIRONMENT: Lives with: lives with their family Lives in: House/apartment  OCCUPATION: [employer]  PLOF: Independent  PATIENT GOALS: decrease pain, improve comfort with work and other activities  OBJECTIVE:  Note: Objective measures were completed at Evaluation unless otherwise noted.  DIAGNOSTIC FINDINGS:  See imaging  PATIENT SURVEYS:  Modified Oswestry:   COGNITION: Overall cognitive status: Within functional limits for tasks assessed     SENSATION: WFL  POSTURE: rounded shoulders, forward head, and increased lumbar lordosis  PALPATION: TTP to L latissimus, T6-8 hypomobility  LUMBAR ROM:   AROM eval  Flexion WNL  Extension WNL  Right lateral flexion   Left  lateral flexion   Right rotation 50% reduced  Left rotation WNL   (Blank rows = not tested)  UPPER EXTREMITY ROM:  Active ROM Right eval Left eval  Shoulder flexion WNL WNL  Shoulder extension    Shoulder abduction WNL   Shoulder adduction    Shoulder extension     (Blank rows = not tested)  UPPER EXTREMITY MMT:  MMT Right eval Left eval  Shoulder flexion    Shoulder extension    Shoulder abduction    Shoulder adduction    Shoulder extension    Shoulder internal rotation    Shoulder external rotation    Middle trapezius 4 3  Lower trapezius 4 3  Rhomboid 4 3   (Blank rows = not tested)   FUNCTIONAL TESTS:  DNT  GAIT: Distance walked: 67ft Assistive device utilized: None Level of assistance: Complete Independence Comments: no overt deviations  TREATMENT: [facility] Adult PT Treatment:                                                DATE: 01/17/2024 Therapeutic Exercise: Cat cow x 5 Seated bilateral ER  5 RTB Seated horizontal abd x 5 RTB Row x 10 blue band S/L open book x 5 ea Child's pose with R lateral x 30  PATIENT EDUCATION:  Education details: eval findings, ODI, HEP, POC Person educated: Patient Education method: Explanation, Demonstration, and Handouts Education comprehension: verbalized understanding and returned demonstration  HOME EXERCISE PROGRAM: Access Code: [email or web address] URL: [email or web address] Date: 01/17/2024 Prepared by: Alm Kingdom  Exercises - Cat Cow  - 1 x daily - 7 x weekly - 2 sets - 10 reps - 5 sec hold - Shoulder External Rotation and Scapular Retraction with Resistance  - 1 x daily - 7 x weekly - 2-3 sets - 10 reps - red band hold - Seated Shoulder Horizontal Abduction with Resistance  - 1 x daily - 7 x weekly - 2-3 sets - 10 reps - red band hold - Standing Shoulder Row  with Anchored Resistance  - 1 x daily - 7 x weekly - 3 sets - 10 reps - blue band hold - Sidelying Thoracic Rotation with Open Book  - 1 x daily - 7  x weekly - 2 sets - 10 reps - 5 sec hold - Child's Pose with Sidebending (Mirrored)  - 1 x daily - 7 x weekly - 2-3 reps - 30 sec hold  ASSESSMENT:  CLINICAL IMPRESSION: Patient is a 63 y.o. F who was seen today for physical therapy evaluation and treatment for acute on chronic L sided thoracic and mid back/flank pain. Physical findings are consistent with physician impression as pt demonstrates decrease in thoracic mobility and palpable pain to periscapular musculature. ODI score shows moderate disability in performance of home ADLs and community activities due to pain. Pt would benefit from skilled PT services in order to decrease pain by improving thoracic mobility and strength.    OBJECTIVE IMPAIRMENTS: decreased activity tolerance, decreased endurance, decreased mobility, decreased ROM, decreased strength, and pain  ACTIVITY LIMITATIONS: carrying, lifting, dressing, and reach over head  PARTICIPATION LIMITATIONS: meal prep, cleaning, driving, shopping, community activity, occupation, and yard work  PERSONAL FACTORS: 1-2 comorbidities: HTN, DM II are also affecting patient's functional outcome.   REHAB POTENTIAL: Excellent  CLINICAL DECISION MAKING: Stable/uncomplicated  EVALUATION COMPLEXITY: Low   GOALS: Goals reviewed with patient? No  SHORT TERM GOALS: Target date: 02/10/2024   Pt will be compliant and knowledgeable with initial HEP for improved comfort and carryover Baseline: initial HEP given Goal status: INITIAL  2.  Pt will self report L sided back pain no greater than 6/10 for improved comfort and functional ability Baseline: 10/10 at worst Goal status: INITIAL  LONG TERM GOALS: Target date: 03/16/2024   Pt will be decrease ODI disability score to no greater than 25% as proxy for functional improvement Baseline: 45% disability Goal status: INITIAL  2.  Pt will self report L sided back pain no greater than 3/10 for improved comfort and functional  ability Baseline: 10/10 at worst Goal status: INITIAL   3.  Pt will improve R thoracic rotation to WNL with no reports of pain for improved comfort with work related duties Baseline: 75% Goal status: INITIAL  4.  Pt will improve L middle/lower trap and L rhomboid strength to no less than 4/5 for improved comfort and function Baseline: 3/5 Goal status: INITIAL  PLAN:  PT FREQUENCY: 1x/week  PT DURATION: 8 weeks  PLANNED INTERVENTIONS: [id number]- PT Re-evaluation, 97110-Therapeutic exercises, 97530- Therapeutic activity, [id number]- Neuromuscular re-education, 97535- Self Care, [id number]- Manual therapy, [id number]- Electrical stimulation (unattended), [id number]- Electrical stimulation (manual), 20560 (1-2 muscles), 20561 (3+ muscles)- Dry Needling, Patient/Family education, Cryotherapy, and Moist heat.  PLAN FOR NEXT SESSION: assess HEP response, thoracic mobility, periscapular strengthening    Alm JAYSON Kingdom, PT 01/20/2024, 2:59 PM

## 2024-01-23 NOTE — Progress Notes (Unsigned)
 Ben Jackson D.[facility] [address] [place] [address] Phone: [phone]   Assessment and Plan:     1. Chronic left-sided thoracic back pain (Primary) 2. Chronic bilateral low back pain without sciatica 3. 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  My interpretation: No acute fracture or vertebral collapse.  Grade 1 anterolisthesis L4 on L5 with lumbar facet arthropathy. - Recommend further evaluation with MRI of thoracic and lumbar spine based on no significant improvement despite >6 weeks of conservative therapy that is included physician directed HEP, physical therapy, Celebrex  medication courses.  Pain remains >6/10 with pain with day-to-day activities. - Continue HEP and physical therapy - Elected to defer OMT until MRIs have resulted.  Patient did have mild benefit from initial OMT    Pertinent previous records reviewed include none   Follow Up: 5 days after MRI to review results and discuss treatment plan.  Could consider epidural CSI if appropriate.  Could consider repeat OMT versus trigger point injections   Subjective:   I, Maria Hammond, am serving as a Neurosurgeon for Doctor Morene Mace   Chief Complaint: back pain    HPI:    12/27/2023 Patient is a 63 year old female with back pain. Patient states pain started 2 months ago. Thoracic and lumbar pain. No MOI. Hx of intermittent back pain. No radiating pain. L side pain only. Ibu , muscle relaxer, and prednisone  have not helped. Ice, heat and rest have not helped. Decreased ROM. Intermittent pain, constant annoyance.    01/24/2024 Patient states she is a little better. Not a 100%    Relevant Historical Information: Hypertension, DM type  II  Additional pertinent review of systems negative.   Current Outpatient Medications:    atorvastatin  (LIPITOR) 20 MG tablet, Take 1 tablet (20 mg total) by mouth daily., Disp: 90 tablet, Rfl: 1   celecoxib  (CELEBREX ) 200 MG capsule, Take 1 capsule (200 mg total) by mouth 2 (two) times daily., Disp: 60 capsule, Rfl: 0   Continuous Glucose Sensor (FREESTYLE LIBRE 3 PLUS SENSOR) MISC, Inject 1 Device into the skin continuous. Change every 15 days, Disp: 6 each, Rfl: 3   levothyroxine  (SYNTHROID ) 50 MCG tablet, Take 1 tablet (50 mcg total) by mouth daily., Disp: 90 tablet, Rfl: 3   lisinopril  (ZESTRIL ) 5 MG tablet, Take 1 tablet by mouth once daily, Disp: 90 tablet, Rfl: 2   metFORMIN  (GLUCOPHAGE -XR) 500 MG 24 hr tablet, Take 2 tablets by mouth twice daily, Disp: 360 tablet, Rfl: 4   Objective:     Vitals:   01/24/24 0850  Pulse: 93  SpO2: 94%  Weight: 172 lb (78 kg)  Height: 5' 3 (1.6 m)      Body mass index is 30.47 kg/m.    Physical Exam:    Gen: Appears well, nad, nontoxic and pleasant Psych: Alert and oriented, appropriate mood and affect Neuro: sensation intact, strength is 5/5 in upper and lower extremities, muscle tone wnl Skin: no susupicious lesions or rashes   Back - Normal skin, Spine with normal alignment and no deformity.     tenderness to T4-T6 and L1-L3 vertebral process palpation.   Left thoracic and lumbar paraspinous muscles are   tender and without spasm  Lacking motion in the left scapula compared to right with  shoulder abduction and flexion.  No scapular winging   Electronically signed by:  Odis Mace D.[facility] 9:20 AM 01/24/24

## 2024-01-24 ENCOUNTER — Ambulatory Visit (INDEPENDENT_AMBULATORY_CARE_PROVIDER_SITE_OTHER)

## 2024-01-24 ENCOUNTER — Ambulatory Visit: Admitting: Sports Medicine

## 2024-01-24 VITALS — HR 93 | Ht 63.0 in | Wt 172.0 lb

## 2024-01-24 DIAGNOSIS — M545 Low back pain, unspecified: Secondary | ICD-10-CM | POA: Diagnosis not present

## 2024-01-24 DIAGNOSIS — G8929 Other chronic pain: Secondary | ICD-10-CM

## 2024-01-24 DIAGNOSIS — M25312 Other instability, left shoulder: Secondary | ICD-10-CM

## 2024-01-24 DIAGNOSIS — M546 Pain in thoracic spine: Secondary | ICD-10-CM | POA: Diagnosis not present

## 2024-01-24 NOTE — Patient Instructions (Addendum)
 Xrays on the way out   MRI referral   - Use Celebrex  200 mg 2x daily as needed for breakthrough pain.  Recommend limiting chronic NSAIDs to 1-2 doses per week to prevent long-term side effects. Use Tylenol  500 to 1000 mg tablets 2-3 times a day as needed for day-to-day pain relief.    Continue HEP and PT   Follow up 5 days after MRI to discuss results

## 2024-01-30 ENCOUNTER — Ambulatory Visit: Payer: Self-pay | Admitting: Sports Medicine

## 2024-02-05 ENCOUNTER — Other Ambulatory Visit: Payer: Self-pay | Admitting: Sports Medicine

## 2024-02-07 ENCOUNTER — Other Ambulatory Visit

## 2024-02-07 ENCOUNTER — Telehealth: Payer: Self-pay | Admitting: Sports Medicine

## 2024-02-07 NOTE — Telephone Encounter (Signed)
 Patient called and stated that she was supposed to have an MRI and they said her insurance was pending. She is calling to ask if we have heard anything from her insurance. Please advise.

## 2024-02-11 ENCOUNTER — Other Ambulatory Visit: Payer: Self-pay | Admitting: Internal Medicine

## 2024-02-11 DIAGNOSIS — E039 Hypothyroidism, unspecified: Secondary | ICD-10-CM

## 2024-02-12 ENCOUNTER — Telehealth: Payer: Self-pay | Admitting: Sports Medicine

## 2024-02-12 NOTE — Telephone Encounter (Signed)
 MRI is still pending insurance authorization.  Pt would like to get a steroid injection or something here in the office to help her with her pain since the MRI is delayed. Wanted to be sure this was OK before scheduling.

## 2024-02-12 NOTE — Telephone Encounter (Signed)
 Scheduled Nurse visit 02/14/2024.

## 2024-02-14 ENCOUNTER — Ambulatory Visit (INDEPENDENT_AMBULATORY_CARE_PROVIDER_SITE_OTHER)

## 2024-02-14 ENCOUNTER — Ambulatory Visit: Admitting: Sports Medicine

## 2024-02-14 DIAGNOSIS — M545 Low back pain, unspecified: Secondary | ICD-10-CM | POA: Diagnosis not present

## 2024-02-14 DIAGNOSIS — G8929 Other chronic pain: Secondary | ICD-10-CM | POA: Diagnosis not present

## 2024-02-14 MED ORDER — METHYLPREDNISOLONE ACETATE 80 MG/ML IJ SUSP
80.0000 mg | Freq: Once | INTRAMUSCULAR | Status: AC
  Administered 2024-02-14: 80 mg via INTRAMUSCULAR

## 2024-02-14 MED ORDER — KETOROLAC TROMETHAMINE 60 MG/2ML IM SOLN
60.0000 mg | Freq: Once | INTRAMUSCULAR | Status: AC
  Administered 2024-02-14: 60 mg via INTRAMUSCULAR

## 2024-02-14 NOTE — Progress Notes (Signed)
 Patient received Toradol  60 mg in left buttocks and Methylprednisolone  80 mg in right buttocks. Patient tolerated well.

## 2024-03-03 ENCOUNTER — Telehealth: Payer: Self-pay | Admitting: Sports Medicine

## 2024-03-03 NOTE — Telephone Encounter (Signed)
 Patient called and is asking if there is a problem with her back will it show up in the MRI even though she is not experiencing pain right now after getting the injection. Please advise.

## 2024-03-11 NOTE — Telephone Encounter (Signed)
 Patient is scheduled for her MRI this Thursday.  She would like to know if she should still have it?

## 2024-03-11 NOTE — Telephone Encounter (Signed)
 Left message informing patient to proceed with MRI.

## 2024-03-13 ENCOUNTER — Ambulatory Visit
Admission: RE | Admit: 2024-03-13 | Discharge: 2024-03-13 | Disposition: A | Discharge status: Home / Self Care | Source: Ambulatory Visit | Attending: Sports Medicine | Admitting: Sports Medicine

## 2024-03-13 DIAGNOSIS — M545 Low back pain, unspecified: Secondary | ICD-10-CM

## 2024-03-13 DIAGNOSIS — M25312 Other instability, left shoulder: Secondary | ICD-10-CM

## 2024-03-13 DIAGNOSIS — G8929 Other chronic pain: Secondary | ICD-10-CM

## 2024-03-18 NOTE — Progress Notes (Signed)
    Maria Hammond D.[facility] [address] [place] [address] Phone: [phone]   Assessment and Plan:     1. Dyskinesis of left scapula 2. Chronic left-sided thoracic back pain 3. Chronic bilateral low back pain without sciatica (Primary) -Chronic with exacerbation, subsequent visit - Overall significant improvement in primarily left upper back pain after IM injection methylprednisone, Toradol .  Most consistent with resolving muscular pain in thoracic, scapular regions - Reviewed patient's thoracic and lumbar spine MRI from 03/13/2024.  Discussed no significant findings in thoracic spine.  Discussed grade 1 anterolisthesis L4 and L5, L4-5 facet arthrosis, mild L5-S1 disc herniation.  Patient does not have low back pain or radicular symptoms, so these findings are not currently symptomatic - Continue HEP -  use Tylenol  as needed for pain relief    Pertinent previous records reviewed include lumbar and thoracic MRI 03/13/2024   Follow Up: As needed if no improvement or worsening of symptoms.  Could consider repeat IM injection methylprednisone/Toradol    Subjective:   I, Maria Hammond, am serving as a neurosurgeon for Doctor Morene Mace   Chief Complaint: back pain    HPI:    12/27/2023 Patient is a 63 year old female with back pain. Patient states pain started 2 months ago. Thoracic and lumbar pain. No MOI. Hx of intermittent back pain. No radiating pain. L side pain only. Ibu , muscle relaxer, and prednisone  have not helped. Ice, heat and rest have not helped. Decreased ROM. Intermittent pain, constant annoyance.    01/24/2024 Patient states she is a little better. Not a 100%   03/27/2024 Patient states cocktail took about a week to kick in. She is feeling good.   Relevant Historical Information: Hypertension, DM type II  Additional pertinent review of systems negative.   Current Outpatient Medications:    atorvastatin  (LIPITOR) 20  MG tablet, Take 1 tablet (20 mg total) by mouth daily., Disp: 90 tablet, Rfl: 1   celecoxib  (CELEBREX ) 200 MG capsule, Take 1 capsule (200 mg total) by mouth 2 (two) times daily., Disp: 60 capsule, Rfl: 0   Continuous Glucose Sensor (FREESTYLE LIBRE 3 PLUS SENSOR) MISC, Inject 1 Device into the skin continuous. Change every 15 days, Disp: 6 each, Rfl: 3   levothyroxine  (SYNTHROID ) 50 MCG tablet, Take 1 tablet by mouth once daily, Disp: 90 tablet, Rfl: 3   lisinopril  (ZESTRIL ) 5 MG tablet, Take 1 tablet by mouth once daily, Disp: 90 tablet, Rfl: 2   metFORMIN  (GLUCOPHAGE -XR) 500 MG 24 hr tablet, Take 2 tablets by mouth twice daily, Disp: 360 tablet, Rfl: 4   Objective:     Vitals:   03/27/24 0849  BP: 120/72  Pulse: 87  SpO2: 97%  Weight: 170 lb (77.1 kg)  Height: 5' 3 (1.6 m)      Body mass index is 30.11 kg/m.    Physical Exam:    Gen: Appears well, nad, nontoxic and pleasant Psych: Alert and oriented, appropriate mood and affect Neuro: sensation intact, strength is 5/5 in upper and lower extremities, muscle tone wnl Skin: no susupicious lesions or rashes  Back - Normal skin, Spine with normal alignment and no deformity.   No tenderness to vertebral process palpation.   Paraspinous muscles are not tender and without spasm NTTP gluteal musculature Straight leg raise negative Trendelenberg negative Piriformis Test negative Gait normal    Electronically signed by:  Odis Mace D[facility] 9:12 AM 03/27/24

## 2024-03-27 ENCOUNTER — Other Ambulatory Visit: Payer: Self-pay | Admitting: Family Medicine

## 2024-03-27 ENCOUNTER — Ambulatory Visit: Admitting: Sports Medicine

## 2024-03-27 VITALS — BP 120/72 | HR 87 | Ht 63.0 in | Wt 170.0 lb

## 2024-03-27 DIAGNOSIS — M545 Low back pain, unspecified: Secondary | ICD-10-CM

## 2024-03-27 DIAGNOSIS — M546 Pain in thoracic spine: Secondary | ICD-10-CM | POA: Diagnosis not present

## 2024-03-27 DIAGNOSIS — G8929 Other chronic pain: Secondary | ICD-10-CM

## 2024-03-27 DIAGNOSIS — M25312 Other instability, left shoulder: Secondary | ICD-10-CM

## 2024-03-27 DIAGNOSIS — I10 Essential (primary) hypertension: Secondary | ICD-10-CM

## 2024-03-27 NOTE — Patient Instructions (Signed)
 Continue HEP  Tylenol as needed  As needed  follow up

## 2024-03-28 ENCOUNTER — Encounter: Payer: Self-pay | Admitting: Family Medicine

## 2024-05-08 ENCOUNTER — Encounter: Payer: Self-pay | Admitting: Emergency Medicine

## 2024-05-08 ENCOUNTER — Encounter: Payer: Self-pay | Admitting: Internal Medicine

## 2024-05-08 ENCOUNTER — Ambulatory Visit: Admitting: Internal Medicine

## 2024-05-08 ENCOUNTER — Encounter: Payer: Self-pay | Admitting: Family Medicine

## 2024-05-08 ENCOUNTER — Ambulatory Visit (INDEPENDENT_AMBULATORY_CARE_PROVIDER_SITE_OTHER): Admitting: Family Medicine

## 2024-05-08 VITALS — BP 118/60 | HR 81 | Ht 63.0 in | Wt 172.2 lb

## 2024-05-08 VITALS — BP 116/62 | HR 75 | Temp 98.3°F | Ht 63.0 in | Wt 172.8 lb

## 2024-05-08 DIAGNOSIS — E785 Hyperlipidemia, unspecified: Secondary | ICD-10-CM | POA: Diagnosis not present

## 2024-05-08 DIAGNOSIS — Z780 Asymptomatic menopausal state: Secondary | ICD-10-CM

## 2024-05-08 DIAGNOSIS — E039 Hypothyroidism, unspecified: Secondary | ICD-10-CM

## 2024-05-08 DIAGNOSIS — I10 Essential (primary) hypertension: Secondary | ICD-10-CM | POA: Diagnosis not present

## 2024-05-08 DIAGNOSIS — E782 Mixed hyperlipidemia: Secondary | ICD-10-CM

## 2024-05-08 DIAGNOSIS — Z7984 Long term (current) use of oral hypoglycemic drugs: Secondary | ICD-10-CM

## 2024-05-08 DIAGNOSIS — Z0000 Encounter for general adult medical examination without abnormal findings: Secondary | ICD-10-CM | POA: Diagnosis not present

## 2024-05-08 DIAGNOSIS — E1165 Type 2 diabetes mellitus with hyperglycemia: Secondary | ICD-10-CM | POA: Diagnosis not present

## 2024-05-08 DIAGNOSIS — Z87891 Personal history of nicotine dependence: Secondary | ICD-10-CM

## 2024-05-08 LAB — POCT GLYCOSYLATED HEMOGLOBIN (HGB A1C): Hemoglobin A1C: 6.7 % — AB (ref 4.0–5.6)

## 2024-05-08 MED ORDER — FREESTYLE LIBRE 3 PLUS SENSOR MISC: 1.0000 | 3 refills | Status: AC

## 2024-05-08 MED ORDER — LISINOPRIL 5 MG PO TABS: 5.0000 mg | ORAL_TABLET | Freq: Every day | ORAL | 3 refills | Status: AC

## 2024-05-08 NOTE — Progress Notes (Signed)
 "  Established Patient Office Visit   Subjective  Patient ID: Maria Hammond, female    DOB: 03-07-1961  Age: 64 y.o. MRN: [id number]  Chief Complaint  Patient presents with   Annual Exam    Pt is a 64 yo female seen for CPE. Pt is not fasting, had a bacon, egg, biscuit ~5:30 am, walnuts, and a banana for lunch.  Pt states she is doing well.  Had A1C checked this am with Endo, 6.7%.  Not checking bs or bp.  Eye exam done in Aug/Sept 2025.  Pt is a former smoker, quit in 2011.  Smoked 1ppd x 30 yrs.    Patient Active Problem List   Diagnosis Date Noted   S/P right knee arthroscopy 02/17/2022   Perimeniscal cyst of right knee 06/09/2021   Acute medial meniscus tear, right, initial encounter 06/09/2021   Unilateral primary osteoarthritis, left knee 01/16/2018   Trigger middle finger of right hand 07/21/2015   Obesity 01/26/2012   Essential hypertension 05/19/2007   Hypothyroidism 11/13/2006   Type 2 diabetes mellitus with hyperglycemia (HCC) 11/13/2006   Hyperlipidemia 11/13/2006   Past Medical History:  Diagnosis Date   Arthritis    DIABETES MELLITUS, TYPE II 11/13/2006   History of kidney stones    HYPERLIPIDEMIA 11/13/2006   HYPERTENSION 05/19/2007   HYPOTHYROIDISM 11/13/2006   TOBACCO USE 12/16/2007   Quit 04/23/10     Past Surgical History:  Procedure Laterality Date   CESAREAN SECTION     COLONOSCOPY  11/11/2009   brodie   IR NEPHROSTOMY PLACEMENT RIGHT  10/15/2020   right foot bone spur surgery      TONSILLECTOMY  1970   TRIGGER FINGER RELEASE     around 2012   Social History[1] Family History  Problem Relation Age of Onset   Diabetes Mother    Heart disease Father    Kidney disease Father    Prostate cancer Father    Breast cancer Sister        age 44   Breast cancer Sister    Breast cancer Sister    Breast cancer Daughter    Diabetes Brother    Diabetes Brother    Colon cancer Neg Hx    Colon polyps Neg Hx    Esophageal cancer Neg Hx    Rectal  cancer Neg Hx    Stomach cancer Neg Hx    Allergies[2]  ROS Negative unless stated above    Objective:     BP 116/62 (BP Location: Left Arm, Patient Position: Sitting, Cuff Size: Large)   Pulse 75   Temp 98.3 F (36.8 C) (Oral)   Ht 5' 3 (1.6 m)   Wt 172 lb 12.8 oz (78.4 kg)   LMP 12/20/2011   SpO2 98%   BMI 30.61 kg/m  BP Readings from Last 3 Encounters:  05/08/24 116/62  05/08/24 118/60  03/27/24 120/72   Wt Readings from Last 3 Encounters:  05/08/24 172 lb 12.8 oz (78.4 kg)  05/08/24 172 lb 3.2 oz (78.1 kg)  03/27/24 170 lb (77.1 kg)      Physical Exam Constitutional:      Appearance: Normal appearance.  HENT:     Head: Normocephalic and atraumatic.     Right Ear: Tympanic membrane, ear canal and external ear normal.     Left Ear: Tympanic membrane, ear canal and external ear normal.     Nose: Nose normal.     Mouth/Throat:     Mouth: Mucous membranes  are moist.     Pharynx: No oropharyngeal exudate or posterior oropharyngeal erythema.  Eyes:     General: No scleral icterus.    Extraocular Movements: Extraocular movements intact.     Conjunctiva/sclera: Conjunctivae normal.     Pupils: Pupils are equal, round, and reactive to light.  Neck:     Thyroid : No thyromegaly.     Vascular: No carotid bruit.  Cardiovascular:     Rate and Rhythm: Normal rate and regular rhythm.     Pulses: Normal pulses.     Heart sounds: Normal heart sounds. No murmur heard.    No friction rub.  Pulmonary:     Effort: Pulmonary effort is normal.     Breath sounds: Normal breath sounds. No wheezing, rhonchi or rales.  Abdominal:     General: Bowel sounds are normal.     Palpations: Abdomen is soft.     Tenderness: There is no abdominal tenderness.  Musculoskeletal:        General: No deformity. Normal range of motion.  Lymphadenopathy:     Cervical: No cervical adenopathy.  Skin:    General: Skin is warm and dry.     Findings: No lesion.  Neurological:     General:  No focal deficit present.     Mental Status: She is alert and oriented to person, place, and time.  Psychiatric:        Mood and Affect: Mood normal.        Thought Content: Thought content normal.        05/08/2024    3:20 PM 12/06/2023    9:55 AM 11/16/2023   11:41 AM  Depression screen PHQ 2/9  Decreased Interest 0 1 3  Down, Depressed, Hopeless 0 0 0  PHQ - 2 Score 0 1 3  Altered sleeping 0 1 3  Tired, decreased energy 1 3 2   Change in appetite 0 0 0  Feeling bad or failure about yourself  0 0 0  Trouble concentrating 0 0 0  Moving slowly or fidgety/restless 0 0 0  Suicidal thoughts 0 0 0  PHQ-9 Score 1 5  8    Difficult doing work/chores  Somewhat difficult Very difficult     Data saved with a previous flowsheet row definition      05/08/2024    3:20 PM 12/06/2023    9:55 AM 11/16/2023   11:41 AM 02/01/2023    9:58 AM  GAD 7 : Generalized Anxiety Score  Nervous, Anxious, on Edge 0 0 0 0  Control/stop worrying 0 0 0 0  Worry too much - different things 0 0 0 0  Trouble relaxing 0 1 3 0  Restless 0 0 0 0  Easily annoyed or irritable 0 0 0 0  Afraid - awful might happen 0 0 0 0  Total GAD 7 Score 0 1 3 0  Anxiety Difficulty Not difficult at all Not difficult at all Somewhat difficult Not difficult at all     Results for orders placed or performed in visit on 05/08/24  POCT glycosylated hemoglobin (Hb A1C)  Result Value Ref Range   Hemoglobin A1C 6.7 (A) 4.0 - 5.6 %   HbA1c POC (<> result, manual entry)     HbA1c, POC (prediabetic range)     HbA1c, POC (controlled diabetic range)        Assessment & Plan:   Well adult exam -     CBC with Differential/Platelet; Future -     Comprehensive  metabolic panel with GFR; Future -     Lipid panel; Future -     TSH; Future  Type 2 diabetes mellitus with hyperglycemia, without long-term current use of insulin (HCC)  Essential hypertension -     Comprehensive metabolic panel with GFR; Future -     Lisinopril ; Take 1  tablet (5 mg total) by mouth daily.  Dispense: 90 tablet; Refill: 3  Acquired hypothyroidism -     TSH; Future  Mixed hyperlipidemia -     Lipid panel; Future  Encounter for osteoporosis screening in asymptomatic postmenopausal patient -     DG Bone Density; Future  History of tobacco use -     Ambulatory Referral for Lung Cancer Scre  Age appropriate health screenings discussed.  Obtain labs.  Immunizations reviewed.  Mammogram up-to-date done 12/13/2023.  Eye exam done August/September 2025.  Foot exam done 01/03/2024.  Referral for bone density placed.  Low-dose CT cancer screening placed.  Patient with a history of 30 pack years of smoking, quit in 2011.  DM2 well-controlled.  A1c 6.7 earlier today.  Continue current medications including metformin  XR 1000 mg twice daily.  Continue follow-up with endocrinology.  BP well-controlled.  Continue lisinopril  5 mg daily.  Continue Synthroid  50 mcg daily unless med needs to be adjusted based on labs.  Continue Lipitor 20 mg daily and lifestyle modifications.  Return in about 1 year (around 05/08/2025) for physical, f/u in 6 months for chronic conditions.   Maria JONELLE Single, MD     [1]  Social History Tobacco Use   Smoking status: Former    Current packs/day: 0.00    Average packs/day: 1 pack/day for 30.0 years (30.0 ttl pk-yrs)    Types: Cigarettes    Start date: 04/23/1980    Quit date: 04/23/2010    Years since quitting: 14.0   Smokeless tobacco: Never  Vaping Use   Vaping status: Never Used  Substance Use Topics   Alcohol use: Never   Drug use: Never  [2] No Known Allergies  "

## 2024-05-08 NOTE — Progress Notes (Signed)
 Patient ID: Maria Hammond, female   DOB: 06/16/1960, 64 y.o.   MRN: [id number]  HPI: Maria Hammond is a 64 y.o.-year-old female, returning for f/u for DM2, dx 2008, non-insulin-dependent, controlled, without long term complications. Last visit 4 months ago.  Interim history: No increased urination, blurry vision, nausea, chest pain. She is still having dietary indiscretions in the form of chips with her lunch sandwich and also candy afterwards.  She did notice that the candy is raising her blood sugars.  DM2: Reviewed HbA1c levels: Lab Results  Component Value Date   HGBA1C 6.6 (A) 01/03/2024   HGBA1C 6.9 (A) 08/30/2023   HGBA1C 6.8 (A) 05/10/2023   HGBA1C 7.1 (H) 02/01/2023   HGBA1C 6.6 (A) 09/07/2022   HGBA1C 6.4 (A) 05/04/2022   HGBA1C 6.5 (A) 01/19/2022   HGBA1C 6.4 (A) 09/15/2021   HGBA1C 6.8 (A) 05/12/2021   HGBA1C 6.8 (A) 01/06/2021   HGBA1C 7.4 (H) 11/04/2020   HGBA1C 7.1 (A) 09/02/2020   HGBA1C 6.9 (A) 04/29/2020   HGBA1C 6.5 (A) 08/27/2019   HGBA1C 6.2 (A) 02/24/2019   HGBA1C 6.5 (A) 11/06/2018   HGBA1C 6.9 (H) 08/12/2018   HGBA1C 6.1 (A) 02/25/2018   HGBA1C 6.6 (A) 10/22/2017   HGBA1C 7.7 (H) 07/19/2017  03/13/2017: HbA1c calculated from fructosamine 6.16%  Pt is on a regimen of: - Metformin  ER 1000 mg 2x a day We tried to start Jardiance  10 mg before breakfast in 08/2023 but $$$. We tried Rybelsus  08/2020 - $$$. Retried 04/2021 >> $$$. She could not afford Januvia  and Tradjenta . We stopped Amaryl  2 mg in 03/2013 as her sugars were at goal and even had some lows and got lightheaded 4 h after b'fast, while at work (resolved after having a snack).  I previously recommended glipizide  ER but she was not using it.  Pt checked her sugars >4x a day:  Previously:  Previously:  Lowest: 58 >> ... 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 Highest sugars: 298 >> 300 >> 200 >> 200s.  Meter: ReliOn  Pt's meals are: - Breakfast (4 am): 3  strips turkey bacon + 1 egg + coffee - splenda and cream - snack: banana, nuts - Lunch: PB sandwich + bag of chips + yoghurt + fruit + diet Mtn Dew - snack: fruit and nuts - Dinner: sauerkraut + sausage She was drinking up 3 gallons of milk a week! >> now almond milk  She works starting at 4 AM and has to wake up at 2 AM.  She works until approximately 12 PM.  She goes to bed around 8 PM.  -No CKD, last BUN/creatinine:  Lab Results  Component Value Date   BUN 14 01/03/2024   CREATININE 0.60 01/03/2024   Lab Results  Component Value Date   MICRALBCREAT NOTE 08/30/2023   MICRALBCREAT 13.0 10/08/2008   MICRALBCREAT 11.8 05/09/2007   MICRALBCREAT 5.0 11/08/2006  On lisinopril .  -+ HL; last set of lipids: Lab Results  Component Value Date   CHOL 149 01/03/2024   HDL 71 01/03/2024   LDLCALC 66 01/03/2024   LDLDIRECT 66.0 07/13/2014   TRIG 43 01/03/2024   CHOLHDL 2.1 01/03/2024  On Lipitor 20 mg daily.  - last eye exam was in 11/2023: No DR reportedly.  Dr Daina.    - no numbness and tingling in her feet. She had Sx R big toe 02/2019. She sees podiatry. She had numbness in fingertips >> resolved after B12 supplementation.  She was found to have a B12 of 192 in 09/2019.  She had hammer toe sx (R foot) in 2023 and saw Dr. Magdalen several times.  Last foot exam 01/03/2024 here in clinic.    Hypothyroidism:  Reviewed her TFTs: Lab Results  Component Value Date   TSH 0.54 01/03/2024   TSH 0.70 01/04/2023   TSH 0.92 01/19/2022   Pt is on levothyroxine  50 mcg daily (dose decreased 10/2019), taken: - in am - fasting - at least 1h from b'fast - no calcium  - no iron - no multivitamins - no PPIs - not on Biotin  Pt denies: - feeling nodules in neck - hoarseness - dysphagia - choking  ROS: + see HPI  I reviewed pt's medications, allergies, PMH, social hx, family hx, and changes were documented in the history of present illness. Otherwise, unchanged from my initial visit  note.  Past Medical History:  Diagnosis Date   Arthritis    DIABETES MELLITUS, TYPE II 11/13/2006   History of kidney stones    HYPERLIPIDEMIA 11/13/2006   HYPERTENSION 05/19/2007   HYPOTHYROIDISM 11/13/2006   TOBACCO USE 12/16/2007   Quit 04/23/10     Past Surgical History:  Procedure Laterality Date   CESAREAN SECTION     COLONOSCOPY  11/11/2009   brodie   IR NEPHROSTOMY PLACEMENT RIGHT  10/15/2020   right foot bone spur surgery      TONSILLECTOMY  1970   TRIGGER FINGER RELEASE     around 2012   Social History   Socioeconomic History   Marital status: Divorced    Spouse name: Not on file   Number of children: Not on file   Years of education: Not on file   Highest education level: Not on file  Occupational History   Not on file  Tobacco Use   Smoking status: Former    Current packs/day: 0.00    Average packs/day: 1 pack/day for 30.0 years (30.0 ttl pk-yrs)    Types: Cigarettes    Start date: 04/23/1980    Quit date: 04/23/2010    Years since quitting: 14.0   Smokeless tobacco: Never  Vaping Use   Vaping status: Never Used  Substance and Sexual Activity   Alcohol use: Never   Drug use: Never   Sexual activity: Yes    Partners: Male  Other Topics Concern   Not on file  Social History Narrative   New relationship.    In process of divorce (starting 2015- still going 2018). 2 children- boy 64 autism goes to [facility] and works and girl 39 works at the jail in 2016. Son lives with her.  Husband no longer in house. Daughter in [place].       Works at [employer]- 24 years in May 2018.       Hobbies: tv, facebook/internet. Church.       Regular exercise: not at this time   Caffeine use: Diet Mt Dew   Social Drivers of Health   Tobacco Use: Medium Risk (03/28/2024)   Patient History    Smoking Tobacco Use: Former    Smokeless Tobacco Use: Never    Passive Exposure: Not on file  Financial Resource Strain: Not on file  Food Insecurity: Not on file  Transportation  Needs: Not on file  Physical Activity: Not on file  Stress: Not on file  Social Connections: Not on file  Intimate Partner Violence: Not on file  Depression (PHQ2-9): Medium Risk (12/06/2023)   Depression (PHQ2-9)  PHQ-2 Score: 5  Alcohol Screen: Not on file  Housing: Not on file  Utilities: Not on file  Health Literacy: Not on file   Current Outpatient Medications on File Prior to Visit  Medication Sig Dispense Refill   atorvastatin  (LIPITOR) 20 MG tablet Take 1 tablet (20 mg total) by mouth daily. 90 tablet 1   celecoxib  (CELEBREX ) 200 MG capsule Take 1 capsule (200 mg total) by mouth 2 (two) times daily. 60 capsule 0   Continuous Glucose Sensor (FREESTYLE LIBRE 3 PLUS SENSOR) MISC Inject 1 Device into the skin continuous. Change every 15 days 6 each 3   levothyroxine  (SYNTHROID ) 50 MCG tablet Take 1 tablet by mouth once daily 90 tablet 3   lisinopril  (ZESTRIL ) 5 MG tablet Take 1 tablet by mouth once daily 30 tablet 0   metFORMIN  (GLUCOPHAGE -XR) 500 MG 24 hr tablet Take 2 tablets by mouth twice daily 360 tablet 4   No current facility-administered medications on file prior to visit.   No Known Allergies Family History  Problem Relation Age of Onset   Diabetes Mother    Heart disease Father    Kidney disease Father    Prostate cancer Father    Breast cancer Sister        age 3   Breast cancer Sister    Breast cancer Sister    Breast cancer Daughter    Diabetes Brother    Diabetes Brother    Colon cancer Neg Hx    Colon polyps Neg Hx    Esophageal cancer Neg Hx    Rectal cancer Neg Hx    Stomach cancer Neg Hx    PE: BP 118/60   Pulse 81   Ht 5' 3 (1.6 m)   Wt 172 lb 3.2 oz (78.1 kg)   LMP 12/20/2011   SpO2 95%   BMI 30.50 kg/m    Wt Readings from Last 15 Encounters:  05/08/24 172 lb 3.2 oz (78.1 kg)  03/27/24 170 lb (77.1 kg)  01/24/24 172 lb (78 kg)  01/03/24 173 lb 12.8 oz (78.8 kg)  12/27/23 173 lb (78.5 kg)  12/06/23 175 lb 12.8 oz (79.7 kg)   11/16/23 176 lb 9.6 oz (80.1 kg)  08/30/23 182 lb 9.6 oz (82.8 kg)  05/10/23 175 lb 6.4 oz (79.6 kg)  02/14/23 173 lb (78.5 kg)  02/01/23 173 lb 9.6 oz (78.7 kg)  01/04/23 178 lb 6.4 oz (80.9 kg)  09/07/22 178 lb (80.7 kg)  07/14/22 178 lb 9.6 oz (81 kg)  05/04/22 182 lb (82.6 kg)   Constitutional: overweight, in NAD Eyes:  EOMI, no exophthalmos ENT: no neck masses, no cervical lymphadenopathy Cardiovascular: RRR, No MRG Respiratory: CTA B Musculoskeletal: no deformities Skin:no rashes Neurological: no tremor with outstretched hands  ASSESSMENT: 1. DM2, non-insulin-dependent, controlled, without long term complications, but with occasional hyperglycemia  2. Hypothyroidism  3. HL  PLAN:  1. Patient with controlled type 2 diabetes, on metformin  ER (she had bloating with the IR formulation), with a lower HbA1c at last visit, at 6.6%.  Sugars are much improved at that time, likely due to increased activity at work.  She only had occasional hyperglycemic spikes after eating chips and I advised her to stop these.  We did not change the regimen afterwards.  At that time, she was not able to start Jardiance  despite the fact that this was covered with the PA, because it was still expensive. CGM interpretation: -At today's visit, we reviewed her CGM downloads:  It appears that 95% of values are in target range (goal >70%), while 5% are higher than 180 (goal <25%), and 0% are lower than 70 (goal <4%).  The calculated average blood sugar is 128.  The projected HbA1c for the next 3 months (GMI) is 6.4%. -Reviewing the CGM trends, sugars are fairly well-controlled, fluctuating within the target range but with occasional hyperglycemic spikes after meals, and particularly after candy.  She also noticed low blood sugars occasionally after eating them.  We did discuss about trying to stop these and try to eat healthier sweets, eat them in the end of the meal, to go for light exercise after eating them.   Otherwise, we will not change the regimen. - I suggested to:  Patient Instructions  Please continue: - Metformin  ER 1000 mg 2x a day  Please continue Levothyroxine  50 mcg daily.  Take the thyroid  hormone every day, with water, at least 30 minutes before breakfast, separated by at least 4 hours from: - acid reflux medications - calcium  - iron - multivitamins   Please return in 4 months.  - we checked her HbA1c: 6.7% (slightly higher) - advised to check sugars at different times of the day - 4x a day, rotating check times - advised for yearly eye exams >> she is UTD - return to clinic in 4 months  2. Hypothyroidism - latest thyroid  labs reviewed with pt. >> normal: Lab Results  Component Value Date   TSH 0.54 01/03/2024  - she continues on LT4 50 mcg daily - pt feels good on this dose. - we discussed about taking the thyroid  hormone every day, with water, >30 minutes before breakfast, separated by >4 hours from acid reflux medications, calcium , iron, multivitamins. Pt. is taking it correctly.   3. HL -She has a high LP(a) - Her lipid fractions were all at goal in 12/2023: Lab Results  Component Value Date   CHOL 149 01/03/2024   HDL 71 01/03/2024   LDLCALC 66 01/03/2024   LDLDIRECT 66.0 07/13/2014   TRIG 43 01/03/2024   CHOLHDL 2.1 01/03/2024  - She continues on Lipitor 20 mg daily without side effects  Lela Fendt, MD PhD [facility]

## 2024-05-08 NOTE — Patient Instructions (Addendum)
 Please continue: - Metformin  ER 1000 mg 2x a day  Please continue Levothyroxine  50 mcg daily.  Take the thyroid  hormone every day, with water, at least 30 minutes before breakfast, separated by at least 4 hours from: - acid reflux medications - calcium  - iron - multivitamins   Please return in 4 months.

## 2024-05-09 LAB — LIPID PANEL
Cholesterol: 174 mg/dL (ref 28–200)
HDL: 77.3 mg/dL
LDL Cholesterol: 88 mg/dL (ref 10–99)
NonHDL: 96.55
Total CHOL/HDL Ratio: 2
Triglycerides: 43 mg/dL (ref 10.0–149.0)
VLDL: 8.6 mg/dL (ref 0.0–40.0)

## 2024-05-09 LAB — CBC WITH DIFFERENTIAL/PLATELET
Basophils Absolute: 0.1 K/uL (ref 0.0–0.1)
Basophils Relative: 1.2 % (ref 0.0–3.0)
Eosinophils Absolute: 0.2 K/uL (ref 0.0–0.7)
Eosinophils Relative: 2.2 % (ref 0.0–5.0)
HCT: 40.5 % (ref 36.0–46.0)
Hemoglobin: 13.5 g/dL (ref 12.0–15.0)
Lymphocytes Relative: 31.1 % (ref 12.0–46.0)
Lymphs Abs: 2.4 K/uL (ref 0.7–4.0)
MCHC: 33.4 g/dL (ref 30.0–36.0)
MCV: 85 fl (ref 78.0–100.0)
Monocytes Absolute: 0.7 K/uL (ref 0.1–1.0)
Monocytes Relative: 9.2 % (ref 3.0–12.0)
Neutro Abs: 4.4 K/uL (ref 1.4–7.7)
Neutrophils Relative %: 56.3 % (ref 43.0–77.0)
Platelets: 261 K/uL (ref 150.0–400.0)
RBC: 4.76 Mil/uL (ref 3.87–5.11)
RDW: 14 % (ref 11.5–15.5)
WBC: 7.8 K/uL (ref 4.0–10.5)

## 2024-05-09 LAB — COMPREHENSIVE METABOLIC PANEL WITH GFR
ALT: 15 U/L (ref 3–35)
AST: 13 U/L (ref 5–37)
Albumin: 4 g/dL (ref 3.5–5.2)
Alkaline Phosphatase: 78 U/L (ref 39–117)
BUN: 11 mg/dL (ref 6–23)
CO2: 29 meq/L (ref 19–32)
Calcium: 9.7 mg/dL (ref 8.4–10.5)
Chloride: 100 meq/L (ref 96–112)
Creatinine, Ser: 0.55 mg/dL (ref 0.40–1.20)
GFR: 95.97 mL/min
Glucose, Bld: 112 mg/dL — ABNORMAL HIGH (ref 70–99)
Potassium: 4.2 meq/L (ref 3.5–5.1)
Sodium: 136 meq/L (ref 135–145)
Total Bilirubin: 0.4 mg/dL (ref 0.2–1.2)
Total Protein: 7.5 g/dL (ref 6.0–8.3)

## 2024-05-09 LAB — TSH: TSH: 0.99 u[IU]/mL (ref 0.35–5.50)

## 2024-05-15 ENCOUNTER — Ambulatory Visit (INDEPENDENT_AMBULATORY_CARE_PROVIDER_SITE_OTHER)
Admission: RE | Admit: 2024-05-15 | Discharge: 2024-05-15 | Disposition: A | Discharge status: Home / Self Care | Source: Ambulatory Visit | Attending: Family Medicine | Admitting: Family Medicine

## 2024-05-15 DIAGNOSIS — Z780 Asymptomatic menopausal state: Secondary | ICD-10-CM | POA: Diagnosis not present

## 2024-05-15 DIAGNOSIS — Z13820 Encounter for screening for osteoporosis: Secondary | ICD-10-CM

## 2024-05-16 ENCOUNTER — Ambulatory Visit: Payer: Self-pay | Admitting: Family Medicine

## 2024-05-23 ENCOUNTER — Ambulatory Visit: Payer: Self-pay | Admitting: Family Medicine

## 2024-05-29 ENCOUNTER — Telehealth: Payer: Self-pay

## 2024-05-29 DIAGNOSIS — Z122 Encounter for screening for malignant neoplasm of respiratory organs: Secondary | ICD-10-CM

## 2024-05-29 DIAGNOSIS — Z87891 Personal history of nicotine dependence: Secondary | ICD-10-CM

## 2024-05-29 NOTE — Telephone Encounter (Signed)
 Lung Cancer Screening Narrative/Criteria Questionnaire (Cigarette Smokers Only- No Cigars/Pipes/vapes)   Maria Hammond   SDMV:06/04/2024 at 9:30 [place]        05-Aug-1960               LDCT: 06/12/2024 at 8:00 am GI    64 y.o.   Phone:  [phone]  Lung Screening Narrative (confirm age 68-77 yrs Medicare / 50-80 yrs Private pay insurance)   Insurance information: BCBS   Referring Provider:    This screening involves an initial phone call with a team member from our program. It is called a shared decision making visit. The initial meeting is required by  insurance and Medicare to make sure you understand the program. This appointment takes about 15-20 minutes to complete. You will complete the screening scan at your scheduled date/time.  This scan takes about 5-10 minutes to complete. You can eat and drink normally before and after the scan.  Criteria questions for Lung Cancer Screening:   Are you a current or former smoker? Former Age began smoking: 18    If you are a former smoker, what year did you quit smoking? 2011 (within 15 yrs)   To calculate your smoking history, I need an accurate estimate of how many packs of cigarettes you smoked per day and for how many years. (Not just the number of PPD you are now smoking)   Years smoking 30 x Packs per day 1 = Pack years 30   (at least 20 pack yrs)   (Make sure they understand that we need to know how much they have smoked in the past, not just the number of PPD they are smoking now)  Do you have a personal history of cancer?  No    Do you have a family history of cancer? Yes  (cancer type and and relative) Daughter has breast cancer and 3 sister with breast cancer.   Are you coughing up blood?  No  Have you had unexplained weight loss of 15 lbs or more in the last 6 months? No  It looks like you meet all criteria.  When would be a good time for us  to schedule you for this screening?   Additional information: N/A

## 2024-06-04 ENCOUNTER — Encounter: Admitting: Adult Health

## 2024-06-12 ENCOUNTER — Ambulatory Visit

## 2024-09-04 ENCOUNTER — Ambulatory Visit: Admitting: Internal Medicine

## 2024-10-30 ENCOUNTER — Ambulatory Visit: Admitting: Family Medicine
